# Patient Record
Sex: Female | Born: 1950
Health system: Southern US, Community
[De-identification: ages and names within clinical notes are randomized; demographics above are authoritative.]

## PROBLEM LIST (undated history)

## (undated) DIAGNOSIS — F419 Anxiety disorder, unspecified: Secondary | ICD-10-CM

## (undated) DIAGNOSIS — F329 Major depressive disorder, single episode, unspecified: Secondary | ICD-10-CM

## (undated) DIAGNOSIS — F32A Depression, unspecified: Secondary | ICD-10-CM

## (undated) DIAGNOSIS — M419 Scoliosis, unspecified: Secondary | ICD-10-CM

## (undated) DIAGNOSIS — M75100 Unspecified rotator cuff tear or rupture of unspecified shoulder, not specified as traumatic: Secondary | ICD-10-CM

## (undated) DIAGNOSIS — Z98891 History of uterine scar from previous surgery: Secondary | ICD-10-CM

## (undated) DIAGNOSIS — A6000 Herpesviral infection of urogenital system, unspecified: Secondary | ICD-10-CM

## (undated) DIAGNOSIS — I10 Essential (primary) hypertension: Secondary | ICD-10-CM

## (undated) DIAGNOSIS — T7840XA Allergy, unspecified, initial encounter: Secondary | ICD-10-CM

## (undated) HISTORY — DX: Essential (primary) hypertension: I10

## (undated) HISTORY — PX: BILATERAL CARPAL TUNNEL RELEASE: SHX6508

## (undated) HISTORY — DX: Anxiety disorder, unspecified: F41.9

## (undated) HISTORY — DX: Depression, unspecified: F32.A

## (undated) HISTORY — PX: OTHER SURGICAL HISTORY: SHX169

## (undated) HISTORY — DX: Herpesviral infection of urogenital system, unspecified: A60.00

## (undated) HISTORY — PX: BREAST EXCISIONAL BIOPSY: SUR124

## (undated) HISTORY — PX: TOTAL SHOULDER ARTHROPLASTY: SHX126

## (undated) HISTORY — DX: Scoliosis, unspecified: M41.9

## (undated) HISTORY — DX: Allergy, unspecified, initial encounter: T78.40XA

## (undated) HISTORY — DX: History of uterine scar from previous surgery: Z98.891

## (undated) HISTORY — PX: TRIGGER FINGER RELEASE: SHX641

## (undated) HISTORY — PX: FOOT SURGERY: SHX648

## (undated) HISTORY — DX: Unspecified rotator cuff tear or rupture of unspecified shoulder, not specified as traumatic: M75.100

## (undated) HISTORY — DX: Major depressive disorder, single episode, unspecified: F32.9

## (undated) HISTORY — PX: BLEPHAROPLASTY: SUR158

## (undated) HISTORY — PX: BREAST BIOPSY: SHX20

---

## 2016-02-15 ENCOUNTER — Telehealth: Payer: Self-pay | Admitting: Family Medicine

## 2016-02-15 NOTE — Telephone Encounter (Signed)
Pts daughter referred her to Dr. Laury AxonLowne as PCP. Daughter is Wilhemina Bonitoicole Gillott. Pt has Medicare and [employer] insurance. Please advise.

## 2016-02-15 NOTE — Telephone Encounter (Signed)
i will see her

## 2016-02-15 NOTE — Telephone Encounter (Signed)
Please advise if you would see this patient.Erin Acosta.    KP

## 2016-02-16 NOTE — Telephone Encounter (Signed)
Scheduled appt 1st available and left msg to notify pt of date/time

## 2016-03-02 ENCOUNTER — Telehealth: Payer: Self-pay | Admitting: *Deleted

## 2016-03-02 DIAGNOSIS — Z124 Encounter for screening for malignant neoplasm of cervix: Secondary | ICD-10-CM | POA: Diagnosis not present

## 2016-03-02 NOTE — Telephone Encounter (Signed)
Received patient Medical Records; forwarded to provider/SLSL 04/12

## 2016-03-03 DIAGNOSIS — M9901 Segmental and somatic dysfunction of cervical region: Secondary | ICD-10-CM | POA: Diagnosis not present

## 2016-03-03 DIAGNOSIS — M9902 Segmental and somatic dysfunction of thoracic region: Secondary | ICD-10-CM | POA: Diagnosis not present

## 2016-03-03 DIAGNOSIS — M542 Cervicalgia: Secondary | ICD-10-CM | POA: Diagnosis not present

## 2016-03-03 DIAGNOSIS — M546 Pain in thoracic spine: Secondary | ICD-10-CM | POA: Diagnosis not present

## 2016-03-07 DIAGNOSIS — M9902 Segmental and somatic dysfunction of thoracic region: Secondary | ICD-10-CM | POA: Diagnosis not present

## 2016-03-07 DIAGNOSIS — M542 Cervicalgia: Secondary | ICD-10-CM | POA: Diagnosis not present

## 2016-03-07 DIAGNOSIS — M9901 Segmental and somatic dysfunction of cervical region: Secondary | ICD-10-CM | POA: Diagnosis not present

## 2016-03-07 DIAGNOSIS — M546 Pain in thoracic spine: Secondary | ICD-10-CM | POA: Diagnosis not present

## 2016-03-08 DIAGNOSIS — M542 Cervicalgia: Secondary | ICD-10-CM | POA: Diagnosis not present

## 2016-03-08 DIAGNOSIS — M9902 Segmental and somatic dysfunction of thoracic region: Secondary | ICD-10-CM | POA: Diagnosis not present

## 2016-03-08 DIAGNOSIS — M546 Pain in thoracic spine: Secondary | ICD-10-CM | POA: Diagnosis not present

## 2016-03-08 DIAGNOSIS — M9901 Segmental and somatic dysfunction of cervical region: Secondary | ICD-10-CM | POA: Diagnosis not present

## 2016-03-10 DIAGNOSIS — R079 Chest pain, unspecified: Secondary | ICD-10-CM | POA: Diagnosis not present

## 2016-03-10 DIAGNOSIS — F439 Reaction to severe stress, unspecified: Secondary | ICD-10-CM | POA: Diagnosis not present

## 2016-03-10 DIAGNOSIS — E789 Disorder of lipoprotein metabolism, unspecified: Secondary | ICD-10-CM | POA: Diagnosis not present

## 2016-03-10 DIAGNOSIS — I771 Stricture of artery: Secondary | ICD-10-CM | POA: Insufficient documentation

## 2016-03-10 DIAGNOSIS — I10 Essential (primary) hypertension: Secondary | ICD-10-CM | POA: Diagnosis not present

## 2016-03-10 DIAGNOSIS — E785 Hyperlipidemia, unspecified: Secondary | ICD-10-CM | POA: Diagnosis not present

## 2016-03-15 ENCOUNTER — Telehealth: Payer: Self-pay | Admitting: Family Medicine

## 2016-03-15 ENCOUNTER — Ambulatory Visit: Payer: Self-pay | Admitting: Family Medicine

## 2016-03-15 DIAGNOSIS — M9901 Segmental and somatic dysfunction of cervical region: Secondary | ICD-10-CM | POA: Diagnosis not present

## 2016-03-15 DIAGNOSIS — M542 Cervicalgia: Secondary | ICD-10-CM | POA: Diagnosis not present

## 2016-03-15 DIAGNOSIS — M9902 Segmental and somatic dysfunction of thoracic region: Secondary | ICD-10-CM | POA: Diagnosis not present

## 2016-03-15 DIAGNOSIS — M546 Pain in thoracic spine: Secondary | ICD-10-CM | POA: Diagnosis not present

## 2016-03-16 NOTE — Telephone Encounter (Signed)
Pt came in late for appt 4/25 11:15pm,  Charge or no charge? Pt was rescheduled for 5/1

## 2016-03-16 NOTE — Telephone Encounter (Signed)
If first time ---no charge 

## 2016-03-17 DIAGNOSIS — M9902 Segmental and somatic dysfunction of thoracic region: Secondary | ICD-10-CM | POA: Diagnosis not present

## 2016-03-17 DIAGNOSIS — M546 Pain in thoracic spine: Secondary | ICD-10-CM | POA: Diagnosis not present

## 2016-03-17 DIAGNOSIS — M542 Cervicalgia: Secondary | ICD-10-CM | POA: Diagnosis not present

## 2016-03-17 DIAGNOSIS — M9901 Segmental and somatic dysfunction of cervical region: Secondary | ICD-10-CM | POA: Diagnosis not present

## 2016-03-17 NOTE — Telephone Encounter (Signed)
1st no show - no charge

## 2016-03-21 ENCOUNTER — Encounter: Payer: Self-pay | Admitting: Family Medicine

## 2016-03-21 ENCOUNTER — Ambulatory Visit (INDEPENDENT_AMBULATORY_CARE_PROVIDER_SITE_OTHER): Payer: Medicare Other | Admitting: Family Medicine

## 2016-03-21 ENCOUNTER — Other Ambulatory Visit: Payer: Self-pay | Admitting: Family Medicine

## 2016-03-21 VITALS — BP 112/76 | HR 76 | Temp 97.6°F | Ht 60.0 in | Wt 147.4 lb

## 2016-03-21 DIAGNOSIS — F411 Generalized anxiety disorder: Secondary | ICD-10-CM | POA: Diagnosis not present

## 2016-03-21 DIAGNOSIS — M9902 Segmental and somatic dysfunction of thoracic region: Secondary | ICD-10-CM | POA: Diagnosis not present

## 2016-03-21 DIAGNOSIS — M546 Pain in thoracic spine: Secondary | ICD-10-CM | POA: Diagnosis not present

## 2016-03-21 DIAGNOSIS — M75101 Unspecified rotator cuff tear or rupture of right shoulder, not specified as traumatic: Secondary | ICD-10-CM

## 2016-03-21 DIAGNOSIS — M9901 Segmental and somatic dysfunction of cervical region: Secondary | ICD-10-CM | POA: Diagnosis not present

## 2016-03-21 DIAGNOSIS — M542 Cervicalgia: Secondary | ICD-10-CM | POA: Diagnosis not present

## 2016-03-21 MED ORDER — CLONAZEPAM 1 MG PO TABS: 1.0000 mg | ORAL_TABLET | Freq: Every day | ORAL | Status: DC | PRN

## 2016-03-21 MED ORDER — HYDROCODONE-ACETAMINOPHEN 5-325 MG PO TABS: 1.0000 | ORAL_TABLET | Freq: Four times a day (QID) | ORAL | Status: DC | PRN

## 2016-03-21 NOTE — Patient Instructions (Signed)
Generalized Anxiety Disorder Generalized anxiety disorder (GAD) is a mental disorder. It interferes with life functions, including relationships, work, and school. GAD is different from normal anxiety, which everyone experiences at some point in their lives in response to specific life events and activities. Normal anxiety actually helps us prepare for and get through these life events and activities. Normal anxiety goes away after the event or activity is over.  GAD causes anxiety that is not necessarily related to specific events or activities. It also causes excess anxiety in proportion to specific events or activities. The anxiety associated with GAD is also difficult to control. GAD can vary from mild to severe. People with severe GAD can have intense waves of anxiety with physical symptoms (panic attacks).  SYMPTOMS The anxiety and worry associated with GAD are difficult to control. This anxiety and worry are related to many life events and activities and also occur more days than not for 6 months or longer. People with GAD also have three or more of the following symptoms (one or more in children):  Restlessness.   Fatigue.  Difficulty concentrating.   Irritability.  Muscle tension.  Difficulty sleeping or unsatisfying sleep. DIAGNOSIS GAD is diagnosed through an assessment by your health care provider. Your health care provider will ask you questions aboutyour mood,physical symptoms, and events in your life. Your health care provider may ask you about your medical history and use of alcohol or drugs, including prescription medicines. Your health care provider may also do a physical exam and blood tests. Certain medical conditions and the use of certain substances can cause symptoms similar to those associated with GAD. Your health care provider may refer you to a mental health specialist for further evaluation. TREATMENT The following therapies are usually used to treat GAD:    Medication. Antidepressant medication usually is prescribed for long-term daily control. Antianxiety medicines may be added in severe cases, especially when panic attacks occur.   Talk therapy (psychotherapy). Certain types of talk therapy can be helpful in treating GAD by providing support, education, and guidance. A form of talk therapy called cognitive behavioral therapy can teach you healthy ways to think about and react to daily life events and activities.  Stress managementtechniques. These include yoga, meditation, and exercise and can be very helpful when they are practiced regularly. A mental health specialist can help determine which treatment is best for you. Some people see improvement with one therapy. However, other people require a combination of therapies.   This information is not intended to replace advice given to you by your health care provider. Make sure you discuss any questions you have with your health care provider.   Document Released: 03/04/2013 Document Revised: 11/28/2014 Document Reviewed: 03/04/2013 Elsevier Interactive Patient Education 2016 [employer]  

## 2016-03-21 NOTE — Progress Notes (Signed)
Patient ID: Erin Acosta, female    DOB: Apr 19, 1951  Age: 65 y.o. MRN: [id number]    Subjective:  Subjective HPI Erin Acosta presents for for refill on klonopin for anxiety and wants to discuss R shoulder-- she has a hx of a rotator cuff tear.    Review of Systems  Constitutional: Negative for diaphoresis, appetite change, fatigue and unexpected weight change.  Eyes: Negative for pain, redness and visual disturbance.  Respiratory: Negative for cough, chest tightness, shortness of breath and wheezing.   Cardiovascular: Negative for chest pain, palpitations and leg swelling.  Endocrine: Negative for cold intolerance, heat intolerance, polydipsia, polyphagia and polyuria.  Genitourinary: Negative for dysuria, frequency and difficulty urinating.  Musculoskeletal: Positive for arthralgias. Negative for joint swelling.  Neurological: Negative for dizziness, light-headedness, numbness and headaches.  Psychiatric/Behavioral: Positive for decreased concentration. The patient is nervous/anxious.     History Past Medical History  Diagnosis Date  . Hypertension   . Anxiety   . Depression   . Allergy   . Genital herpes   . Scoliosis   . H/O cesarean section   . Rotator cuff tear     She has past surgical history that includes Bilateral carpal tunnel release; Trigger finger release; Foot surgery (Right); Cesarean section; tummy tuck; mini face lift; and Blepharoplasty.   Her family history is not on file.She reports that she has never smoked. She has never used smokeless tobacco. She reports that she drinks about 0.6 oz of alcohol per week. She reports that she does not use illicit drugs.  No current outpatient prescriptions on file prior to visit.   No current facility-administered medications on file prior to visit.     Objective:  Objective Physical Exam  Constitutional: She is oriented to person, place, and time. She appears well-developed and well-nourished.  HENT:  Head:  Normocephalic and atraumatic.  Eyes: Conjunctivae and EOM are normal.  Neck: Normal range of motion. Neck supple. No JVD present. Carotid bruit is not present. No thyromegaly present.  Cardiovascular: Normal rate, regular rhythm and normal heart sounds.   No murmur heard. Pulmonary/Chest: Effort normal and breath sounds normal. No respiratory distress. She has no wheezes. She has no rales. She exhibits no tenderness.  Musculoskeletal: She exhibits tenderness. She exhibits no edema.       Right shoulder: She exhibits tenderness.  Neurological: She is alert and oriented to person, place, and time.  Psychiatric: She has a normal mood and affect.  Nursing note and vitals reviewed.  BP 112/76 mmHg  Pulse 76  Temp(Src) 97.6 F (36.4 C) (Oral)  Ht 5' (1.524 m)  Wt 147 lb 6.4 oz (66.86 kg)  BMI 28.79 kg/m2  SpO2 99% Wt Readings from Last 3 Encounters:  03/21/16 147 lb 6.4 oz (66.86 kg)     No results found for: WBC, HGB, HCT, PLT, GLUCOSE, CHOL, TRIG, HDL, LDLDIRECT, LDLCALC, ALT, AST, NA, K, CL, CREATININE, BUN, CO2, TSH, PSA, INR, GLUF, HGBA1C, MICROALBUR  Patient was never admitted.   Assessment & Plan:  Plan I have changed [name]'s clonazePAM. I am also having her start on HYDROcodone-acetaminophen. Additionally, I am having her maintain her buPROPion, montelukast, lisinopril, aspirin EC, multivitamin, and valACYclovir.  Meds ordered this encounter  Medications  . buPROPion (WELLBUTRIN XL) 300 MG 24 hr tablet    Sig: Take 300 mg by mouth daily.  . montelukast (SINGULAIR) 10 MG tablet    Sig: Take 10 mg by mouth daily.  Marland Kitchen lisinopril (PRINIVIL,ZESTRIL) 5 MG  tablet    Sig: Take 5 mg by mouth daily.  Marland Kitchen. aspirin EC 81 MG tablet    Sig: Take 81 mg by mouth daily.  Marland Kitchen. DISCONTD: clonazePAM (KLONOPIN) 1 MG tablet    Sig: Take 1 mg by mouth daily as needed.  . Multiple Vitamin (MULTIVITAMIN) capsule    Sig: Take 1 capsule by mouth daily.  . valACYclovir (VALTREX) 500 MG tablet     Sig: Take 500 mg by mouth daily.  . clonazePAM (KLONOPIN) 1 MG tablet    Sig: Take 1 tablet (1 mg total) by mouth daily as needed.    Dispense:  90 tablet    Refill:  1  . HYDROcodone-acetaminophen (NORCO/VICODIN) 5-325 MG tablet    Sig: Take 1 tablet by mouth every 6 (six) hours as needed for moderate pain.    Dispense:  30 tablet    Refill:  0    Problem List Items Addressed This Visit      Unprioritized   Generalized anxiety disorder - Primary    Refill klonopin      Relevant Medications   clonazePAM (KLONOPIN) 1 MG tablet    Other Visit Diagnoses    RCT (rotator cuff tear), right        Relevant Medications    HYDROcodone-acetaminophen (NORCO/VICODIN) 5-325 MG tablet       Follow-up: Return if symptoms worsen or fail to improve, for as scheduled.  Donato SchultzYvonne R Lowne Chase, DO

## 2016-03-21 NOTE — Progress Notes (Signed)
Pre visit review using our clinic review tool, if applicable. No additional management support is needed unless otherwise documented below in the visit note. 

## 2016-03-22 DIAGNOSIS — M9902 Segmental and somatic dysfunction of thoracic region: Secondary | ICD-10-CM | POA: Diagnosis not present

## 2016-03-22 DIAGNOSIS — M542 Cervicalgia: Secondary | ICD-10-CM | POA: Diagnosis not present

## 2016-03-22 DIAGNOSIS — M546 Pain in thoracic spine: Secondary | ICD-10-CM | POA: Diagnosis not present

## 2016-03-22 DIAGNOSIS — M9901 Segmental and somatic dysfunction of cervical region: Secondary | ICD-10-CM | POA: Diagnosis not present

## 2016-03-24 DIAGNOSIS — M542 Cervicalgia: Secondary | ICD-10-CM | POA: Diagnosis not present

## 2016-03-24 DIAGNOSIS — M9902 Segmental and somatic dysfunction of thoracic region: Secondary | ICD-10-CM | POA: Diagnosis not present

## 2016-03-24 DIAGNOSIS — M9901 Segmental and somatic dysfunction of cervical region: Secondary | ICD-10-CM | POA: Diagnosis not present

## 2016-03-24 DIAGNOSIS — M546 Pain in thoracic spine: Secondary | ICD-10-CM | POA: Diagnosis not present

## 2016-03-27 DIAGNOSIS — F411 Generalized anxiety disorder: Secondary | ICD-10-CM | POA: Insufficient documentation

## 2016-03-27 DIAGNOSIS — M75101 Unspecified rotator cuff tear or rupture of right shoulder, not specified as traumatic: Secondary | ICD-10-CM | POA: Insufficient documentation

## 2016-03-27 NOTE — Assessment & Plan Note (Addendum)
Refill klonopin Symptoms stable

## 2016-03-27 NOTE — Assessment & Plan Note (Signed)
Pt has been under care or ortho Consider physical therapy

## 2016-03-28 DIAGNOSIS — M9901 Segmental and somatic dysfunction of cervical region: Secondary | ICD-10-CM | POA: Diagnosis not present

## 2016-03-28 DIAGNOSIS — M542 Cervicalgia: Secondary | ICD-10-CM | POA: Diagnosis not present

## 2016-03-28 DIAGNOSIS — M9902 Segmental and somatic dysfunction of thoracic region: Secondary | ICD-10-CM | POA: Diagnosis not present

## 2016-03-28 DIAGNOSIS — M546 Pain in thoracic spine: Secondary | ICD-10-CM | POA: Diagnosis not present

## 2016-03-29 ENCOUNTER — Other Ambulatory Visit: Payer: Self-pay | Admitting: Family Medicine

## 2016-03-29 DIAGNOSIS — M546 Pain in thoracic spine: Secondary | ICD-10-CM | POA: Diagnosis not present

## 2016-03-29 DIAGNOSIS — M9901 Segmental and somatic dysfunction of cervical region: Secondary | ICD-10-CM | POA: Diagnosis not present

## 2016-03-29 DIAGNOSIS — M9902 Segmental and somatic dysfunction of thoracic region: Secondary | ICD-10-CM | POA: Diagnosis not present

## 2016-03-29 DIAGNOSIS — M542 Cervicalgia: Secondary | ICD-10-CM | POA: Diagnosis not present

## 2016-03-31 DIAGNOSIS — M542 Cervicalgia: Secondary | ICD-10-CM | POA: Diagnosis not present

## 2016-03-31 DIAGNOSIS — L700 Acne vulgaris: Secondary | ICD-10-CM | POA: Diagnosis not present

## 2016-03-31 DIAGNOSIS — M546 Pain in thoracic spine: Secondary | ICD-10-CM | POA: Diagnosis not present

## 2016-03-31 DIAGNOSIS — Z79899 Other long term (current) drug therapy: Secondary | ICD-10-CM | POA: Diagnosis not present

## 2016-03-31 DIAGNOSIS — M9902 Segmental and somatic dysfunction of thoracic region: Secondary | ICD-10-CM | POA: Diagnosis not present

## 2016-03-31 DIAGNOSIS — M9901 Segmental and somatic dysfunction of cervical region: Secondary | ICD-10-CM | POA: Diagnosis not present

## 2016-04-01 DIAGNOSIS — E789 Disorder of lipoprotein metabolism, unspecified: Secondary | ICD-10-CM | POA: Diagnosis not present

## 2016-04-05 DIAGNOSIS — M542 Cervicalgia: Secondary | ICD-10-CM | POA: Diagnosis not present

## 2016-04-05 DIAGNOSIS — M9902 Segmental and somatic dysfunction of thoracic region: Secondary | ICD-10-CM | POA: Diagnosis not present

## 2016-04-05 DIAGNOSIS — M9901 Segmental and somatic dysfunction of cervical region: Secondary | ICD-10-CM | POA: Diagnosis not present

## 2016-04-05 DIAGNOSIS — M546 Pain in thoracic spine: Secondary | ICD-10-CM | POA: Diagnosis not present

## 2016-04-07 DIAGNOSIS — M9901 Segmental and somatic dysfunction of cervical region: Secondary | ICD-10-CM | POA: Diagnosis not present

## 2016-04-07 DIAGNOSIS — M546 Pain in thoracic spine: Secondary | ICD-10-CM | POA: Diagnosis not present

## 2016-04-07 DIAGNOSIS — M9902 Segmental and somatic dysfunction of thoracic region: Secondary | ICD-10-CM | POA: Diagnosis not present

## 2016-04-07 DIAGNOSIS — M542 Cervicalgia: Secondary | ICD-10-CM | POA: Diagnosis not present

## 2016-04-11 DIAGNOSIS — M546 Pain in thoracic spine: Secondary | ICD-10-CM | POA: Diagnosis not present

## 2016-04-11 DIAGNOSIS — M9901 Segmental and somatic dysfunction of cervical region: Secondary | ICD-10-CM | POA: Diagnosis not present

## 2016-04-11 DIAGNOSIS — M9902 Segmental and somatic dysfunction of thoracic region: Secondary | ICD-10-CM | POA: Diagnosis not present

## 2016-04-11 DIAGNOSIS — M542 Cervicalgia: Secondary | ICD-10-CM | POA: Diagnosis not present

## 2016-04-12 DIAGNOSIS — M9902 Segmental and somatic dysfunction of thoracic region: Secondary | ICD-10-CM | POA: Diagnosis not present

## 2016-04-12 DIAGNOSIS — M546 Pain in thoracic spine: Secondary | ICD-10-CM | POA: Diagnosis not present

## 2016-04-12 DIAGNOSIS — M9901 Segmental and somatic dysfunction of cervical region: Secondary | ICD-10-CM | POA: Diagnosis not present

## 2016-04-12 DIAGNOSIS — M542 Cervicalgia: Secondary | ICD-10-CM | POA: Diagnosis not present

## 2016-04-14 DIAGNOSIS — M546 Pain in thoracic spine: Secondary | ICD-10-CM | POA: Diagnosis not present

## 2016-04-14 DIAGNOSIS — M9902 Segmental and somatic dysfunction of thoracic region: Secondary | ICD-10-CM | POA: Diagnosis not present

## 2016-04-14 DIAGNOSIS — M9901 Segmental and somatic dysfunction of cervical region: Secondary | ICD-10-CM | POA: Diagnosis not present

## 2016-04-14 DIAGNOSIS — M542 Cervicalgia: Secondary | ICD-10-CM | POA: Diagnosis not present

## 2016-04-19 DIAGNOSIS — M542 Cervicalgia: Secondary | ICD-10-CM | POA: Diagnosis not present

## 2016-04-19 DIAGNOSIS — M9901 Segmental and somatic dysfunction of cervical region: Secondary | ICD-10-CM | POA: Diagnosis not present

## 2016-04-19 DIAGNOSIS — M546 Pain in thoracic spine: Secondary | ICD-10-CM | POA: Diagnosis not present

## 2016-04-19 DIAGNOSIS — M9902 Segmental and somatic dysfunction of thoracic region: Secondary | ICD-10-CM | POA: Diagnosis not present

## 2016-04-21 DIAGNOSIS — M546 Pain in thoracic spine: Secondary | ICD-10-CM | POA: Diagnosis not present

## 2016-04-21 DIAGNOSIS — M9901 Segmental and somatic dysfunction of cervical region: Secondary | ICD-10-CM | POA: Diagnosis not present

## 2016-04-21 DIAGNOSIS — M9902 Segmental and somatic dysfunction of thoracic region: Secondary | ICD-10-CM | POA: Diagnosis not present

## 2016-04-21 DIAGNOSIS — M542 Cervicalgia: Secondary | ICD-10-CM | POA: Diagnosis not present

## 2016-04-25 DIAGNOSIS — M9901 Segmental and somatic dysfunction of cervical region: Secondary | ICD-10-CM | POA: Diagnosis not present

## 2016-04-25 DIAGNOSIS — M9902 Segmental and somatic dysfunction of thoracic region: Secondary | ICD-10-CM | POA: Diagnosis not present

## 2016-04-25 DIAGNOSIS — M542 Cervicalgia: Secondary | ICD-10-CM | POA: Diagnosis not present

## 2016-04-25 DIAGNOSIS — M546 Pain in thoracic spine: Secondary | ICD-10-CM | POA: Diagnosis not present

## 2016-05-03 DIAGNOSIS — M9902 Segmental and somatic dysfunction of thoracic region: Secondary | ICD-10-CM | POA: Diagnosis not present

## 2016-05-03 DIAGNOSIS — M542 Cervicalgia: Secondary | ICD-10-CM | POA: Diagnosis not present

## 2016-05-03 DIAGNOSIS — M546 Pain in thoracic spine: Secondary | ICD-10-CM | POA: Diagnosis not present

## 2016-05-03 DIAGNOSIS — M9901 Segmental and somatic dysfunction of cervical region: Secondary | ICD-10-CM | POA: Diagnosis not present

## 2016-05-05 DIAGNOSIS — M542 Cervicalgia: Secondary | ICD-10-CM | POA: Diagnosis not present

## 2016-05-05 DIAGNOSIS — M546 Pain in thoracic spine: Secondary | ICD-10-CM | POA: Diagnosis not present

## 2016-05-05 DIAGNOSIS — M9901 Segmental and somatic dysfunction of cervical region: Secondary | ICD-10-CM | POA: Diagnosis not present

## 2016-05-05 DIAGNOSIS — M9902 Segmental and somatic dysfunction of thoracic region: Secondary | ICD-10-CM | POA: Diagnosis not present

## 2016-05-10 DIAGNOSIS — M9901 Segmental and somatic dysfunction of cervical region: Secondary | ICD-10-CM | POA: Diagnosis not present

## 2016-05-10 DIAGNOSIS — M546 Pain in thoracic spine: Secondary | ICD-10-CM | POA: Diagnosis not present

## 2016-05-10 DIAGNOSIS — M9902 Segmental and somatic dysfunction of thoracic region: Secondary | ICD-10-CM | POA: Diagnosis not present

## 2016-05-10 DIAGNOSIS — M542 Cervicalgia: Secondary | ICD-10-CM | POA: Diagnosis not present

## 2016-05-12 DIAGNOSIS — M546 Pain in thoracic spine: Secondary | ICD-10-CM | POA: Diagnosis not present

## 2016-05-12 DIAGNOSIS — M9901 Segmental and somatic dysfunction of cervical region: Secondary | ICD-10-CM | POA: Diagnosis not present

## 2016-05-12 DIAGNOSIS — M9902 Segmental and somatic dysfunction of thoracic region: Secondary | ICD-10-CM | POA: Diagnosis not present

## 2016-05-12 DIAGNOSIS — M542 Cervicalgia: Secondary | ICD-10-CM | POA: Diagnosis not present

## 2016-05-17 DIAGNOSIS — M546 Pain in thoracic spine: Secondary | ICD-10-CM | POA: Diagnosis not present

## 2016-05-17 DIAGNOSIS — M9902 Segmental and somatic dysfunction of thoracic region: Secondary | ICD-10-CM | POA: Diagnosis not present

## 2016-05-17 DIAGNOSIS — M542 Cervicalgia: Secondary | ICD-10-CM | POA: Diagnosis not present

## 2016-05-17 DIAGNOSIS — M9901 Segmental and somatic dysfunction of cervical region: Secondary | ICD-10-CM | POA: Diagnosis not present

## 2016-05-19 DIAGNOSIS — M542 Cervicalgia: Secondary | ICD-10-CM | POA: Diagnosis not present

## 2016-05-19 DIAGNOSIS — M9901 Segmental and somatic dysfunction of cervical region: Secondary | ICD-10-CM | POA: Diagnosis not present

## 2016-05-19 DIAGNOSIS — M9902 Segmental and somatic dysfunction of thoracic region: Secondary | ICD-10-CM | POA: Diagnosis not present

## 2016-05-19 DIAGNOSIS — M546 Pain in thoracic spine: Secondary | ICD-10-CM | POA: Diagnosis not present

## 2016-05-26 ENCOUNTER — Other Ambulatory Visit (HOSPITAL_BASED_OUTPATIENT_CLINIC_OR_DEPARTMENT_OTHER): Payer: Self-pay | Admitting: Obstetrics and Gynecology

## 2016-05-26 ENCOUNTER — Ambulatory Visit (HOSPITAL_BASED_OUTPATIENT_CLINIC_OR_DEPARTMENT_OTHER)
Admission: RE | Admit: 2016-05-26 | Discharge: 2016-05-26 | Disposition: A | Discharge status: Home / Self Care | Payer: Medicare Other | Source: Ambulatory Visit | Attending: Obstetrics and Gynecology | Admitting: Obstetrics and Gynecology

## 2016-05-26 ENCOUNTER — Other Ambulatory Visit: Payer: Self-pay | Admitting: Family Medicine

## 2016-05-26 DIAGNOSIS — M542 Cervicalgia: Secondary | ICD-10-CM | POA: Diagnosis not present

## 2016-05-26 DIAGNOSIS — Z1231 Encounter for screening mammogram for malignant neoplasm of breast: Secondary | ICD-10-CM | POA: Diagnosis not present

## 2016-05-26 DIAGNOSIS — M9902 Segmental and somatic dysfunction of thoracic region: Secondary | ICD-10-CM | POA: Diagnosis not present

## 2016-05-26 DIAGNOSIS — M9901 Segmental and somatic dysfunction of cervical region: Secondary | ICD-10-CM | POA: Diagnosis not present

## 2016-05-26 DIAGNOSIS — M546 Pain in thoracic spine: Secondary | ICD-10-CM | POA: Diagnosis not present

## 2016-05-27 DIAGNOSIS — L905 Scar conditions and fibrosis of skin: Secondary | ICD-10-CM | POA: Diagnosis not present

## 2016-05-27 DIAGNOSIS — L700 Acne vulgaris: Secondary | ICD-10-CM | POA: Diagnosis not present

## 2016-06-02 ENCOUNTER — Encounter: Payer: Self-pay | Admitting: Family Medicine

## 2016-06-02 ENCOUNTER — Ambulatory Visit (INDEPENDENT_AMBULATORY_CARE_PROVIDER_SITE_OTHER): Payer: Medicare Other | Admitting: Family Medicine

## 2016-06-02 VITALS — BP 138/85 | HR 80 | Temp 98.1°F | Ht 60.0 in | Wt 146.2 lb

## 2016-06-02 DIAGNOSIS — Z23 Encounter for immunization: Secondary | ICD-10-CM

## 2016-06-02 DIAGNOSIS — I10 Essential (primary) hypertension: Secondary | ICD-10-CM | POA: Diagnosis not present

## 2016-06-02 DIAGNOSIS — E781 Pure hyperglyceridemia: Secondary | ICD-10-CM

## 2016-06-02 DIAGNOSIS — Z0000 Encounter for general adult medical examination without abnormal findings: Secondary | ICD-10-CM

## 2016-06-02 DIAGNOSIS — M9902 Segmental and somatic dysfunction of thoracic region: Secondary | ICD-10-CM | POA: Diagnosis not present

## 2016-06-02 DIAGNOSIS — M546 Pain in thoracic spine: Secondary | ICD-10-CM | POA: Diagnosis not present

## 2016-06-02 DIAGNOSIS — M542 Cervicalgia: Secondary | ICD-10-CM | POA: Diagnosis not present

## 2016-06-02 DIAGNOSIS — M9901 Segmental and somatic dysfunction of cervical region: Secondary | ICD-10-CM | POA: Diagnosis not present

## 2016-06-02 DIAGNOSIS — F411 Generalized anxiety disorder: Secondary | ICD-10-CM | POA: Diagnosis not present

## 2016-06-02 DIAGNOSIS — J302 Other seasonal allergic rhinitis: Secondary | ICD-10-CM

## 2016-06-02 MED ORDER — CLONAZEPAM 1 MG PO TABS: 1.0000 mg | ORAL_TABLET | Freq: Every day | ORAL | Status: DC | PRN

## 2016-06-02 MED ORDER — MONTELUKAST SODIUM 10 MG PO TABS: 10.0000 mg | ORAL_TABLET | Freq: Every day | ORAL | Status: DC

## 2016-06-02 MED ORDER — LISINOPRIL 5 MG PO TABS: 5.0000 mg | ORAL_TABLET | Freq: Every day | ORAL | Status: DC

## 2016-06-02 NOTE — Progress Notes (Signed)
Pre visit review using our clinic review tool, if applicable. No additional management support is needed unless otherwise documented below in the visit note. 

## 2016-06-02 NOTE — Progress Notes (Signed)
Subjective:    Erin Acosta is a 65 y.o. female who presents for a Welcome to Medicare exam.   Review of Systems  Review of Systems  Constitutional: Negative for activity change, appetite change and fatigue.  HENT: Negative for hearing loss, congestion, tinnitus and ear discharge.   Eyes: Negative for visual disturbance (see optho q1y -- vision corrected to 20/20 with glasses).  Respiratory: Negative for cough, chest tightness and shortness of breath.   Cardiovascular: Negative for chest pain, palpitations and leg swelling.  Gastrointestinal: Negative for abdominal pain, diarrhea, constipation and abdominal distention.  Genitourinary: Negative for urgency, frequency, decreased urine volume and difficulty urinating.  Musculoskeletal: Negative for back pain, arthralgias and gait problem.  Skin: Negative for color change, pallor and rash.  Neurological: Negative for dizziness, light-headedness, numbness and headaches.  Hematological: Negative for adenopathy. Does not bruise/bleed easily.  Psychiatric/Behavioral: Negative for suicidal ideas, confusion, sleep disturbance, self-injury, dysphoric mood, decreased concentration and agitation.  Pt is able to read and write and can do all ADLs No risk for falling No abuse/ violence in home            Objective:    Today's Vitals   06/02/16 1537  BP: 138/85  Pulse: 80  Temp: 98.1 F (36.7 C)  TempSrc: Oral  Height: 5' (1.524 m)  Weight: 146 lb 3.2 oz (66.316 kg)  SpO2: 99%  Body mass index is 28.55 kg/(m^2).  Medications Outpatient Encounter Prescriptions as of 06/02/2016  Medication Sig  . aspirin EC 81 MG tablet Take 81 mg by mouth daily.  Marland Kitchen buPROPion (WELLBUTRIN XL) 300 MG 24 hr tablet Take 300 mg by mouth daily.  . clonazePAM (KLONOPIN) 1 MG tablet Take 1 tablet (1 mg total) by mouth daily as needed.  Marland Kitchen HYDROcodone-acetaminophen (NORCO/VICODIN) 5-325 MG tablet Take 1 tablet by mouth every 6 (six) hours as needed for  moderate pain.  . ISOtretinoin (ACCUTANE) 40 MG capsule Take 40 mg by mouth 2 (two) times daily.  Marland Kitchen lisinopril (PRINIVIL,ZESTRIL) 5 MG tablet Take 5 mg by mouth daily.  . montelukast (SINGULAIR) 10 MG tablet Take 10 mg by mouth daily.  . Multiple Vitamin (MULTIVITAMIN) capsule Take 1 capsule by mouth daily.  . valACYclovir (VALTREX) 500 MG tablet Take 500 mg by mouth daily.   No facility-administered encounter medications on file as of 06/02/2016.     History: Past Medical History  Diagnosis Date  . Hypertension   . Anxiety   . Depression   . Allergy   . Genital herpes   . Scoliosis   . H/O cesarean section   . Rotator cuff tear    Past Surgical History  Procedure Laterality Date  . Bilateral carpal tunnel release    . Trigger finger release    . Foot surgery Right   . Cesarean section    . Tummy tuck    . Mini face lift    . Blepharoplasty      Family History  Problem Relation Age of Onset  . Arthritis    . Hyperlipidemia    . Heart disease    . Stroke    . Hypertension     Social History   Occupational History  . Not on file.   Social History Main Topics  . Smoking status: Never Smoker   . Smokeless tobacco: Never Used  . Alcohol Use: 0.6 oz/week    1 Standard drinks or equivalent per week     Comment: Wine in  the evening  . Drug Use: No  . Sexual Activity: Not on file    Tobacco Counseling Counseling given: Not Answered   Immunizations and Health Maintenance Immunization History  Administered Date(s) Administered  . Zoster 11/21/2013   Health Maintenance Due  Topic Date Due  . Hepatitis C Screening  1951-10-27  . HIV Screening  02/12/1966  . TETANUS/TDAP  02/12/1970  . PAP SMEAR  02/13/1972  . COLONOSCOPY  02/12/2001  . DEXA SCAN  02/13/2016  . PNA vac Low Risk Adult (1 of 2 - PCV13) 02/13/2016    Activities of Daily Living In your present state of health, do you have any difficulty performing the following activities: 06/02/2016 03/21/2016    Hearing? N N  Vision? N N  Difficulty concentrating or making decisions? Y N  Walking or climbing stairs? N N  Dressing or bathing? N N  Doing errands, shopping? N N    Physical Exam   BP 138/85 mmHg  Pulse 80  Temp(Src) 98.1 F (36.7 C) (Oral)  Ht 5' (1.524 m)  Wt 146 lb 3.2 oz (66.316 kg)  BMI 28.55 kg/m2  SpO2 99% General appearance: alert, cooperative, appears stated age and no distress Head: Normocephalic, without obvious abnormality, atraumatic Eyes: conjunctivae/corneas clear. PERRL, EOM's intact. Fundi benign. Ears: normal TM's and external ear canals both ears Nose: Nares normal. Septum midline. Mucosa normal. No drainage or sinus tenderness. Throat: lips, mucosa, and tongue normal; teeth and gums normal Neck: no adenopathy, no carotid bruit, no JVD, supple, symmetrical, trachea midline and thyroid not enlarged, symmetric, no tenderness/mass/nodules Back: symmetric, no curvature. ROM normal. No CVA tenderness. Lungs: clear to auscultation bilaterally Breasts: gyn Heart: regular rate and rhythm, S1, S2 normal, no murmur, click, rub or gallop Abdomen: soft, non-tender; bowel sounds normal; no masses,  no organomegaly Pelvic: deferred--gyn Extremities: extremities normal, atraumatic, no cyanosis or edema Pulses: 2+ and symmetric Skin: Skin color, texture, turgor normal. No rashes or lesions Lymph nodes: Cervical, supraclavicular, and axillary nodes normal. Neurologic: Alert and oriented X 3, normal strength and tone. Normal symmetric reflexes. Normal coordination and gait Advanced Directives: Does patient have an advance directive?: Yes Type of Advance Directive: Healthcare Power of Attorney, Living will Does patient want to make changes to advanced directive?: No - Patient declined Copy of advanced directive(s) in chart?: No - copy requested    Assessment:    This is a routine wellness examination for this patient .   Vision/Hearing screen Hearing Screening  Comments: Whisper normal Vision Screening Comments: opth--annual  Dietary issues and exercise activities discussed:  Current Exercise Habits: Home exercise routine, Type of exercise: strength training/weights, Time (Minutes): 15, Frequency (Times/Week): 7, Weekly Exercise (Minutes/Week): 105, Intensity: Mild, Exercise limited by: None identified  Goals    None     Depression Screen PHQ 2/9 Scores 06/02/2016 03/21/2016  PHQ - 2 Score 0 0     Fall Risk Fall Risk  06/02/2016  Falls in the past year? No    MMSE: 0/0 Patient Care Team: Donato SchultzYvonne R Lowne Chase, DO as PCP - General (Family Medicine)     Plan:     During the course of the visit the patient was educated and counseled about the following appropriate screening and preventive services:   Vaccines to include Pneumoccal, Influenza, Hepatitis B, Td, Zostavax, HCV  Electrocardiogram  Cardiovascular Disease  Colorectal cancer screening  Bone density screening  Diabetes screening  Glaucoma screening  Mammography/PAP  Nutrition counseling  Her current medications and  allergies were reviewed and needed refills of her chronic medications were ordered. The plan for yearly health maintenance was discussed and all orders and referrals were made as appropriate.  Patient Instructions (the written plan) was given to the patient.  1. Welcome to Medicare preventive visit---see above Normal ekg - EKG 12-Lead  2. Hypertriglyceridemia Check labs - POCT urinalysis dipstick - Lipid panel - CBC with Differential/Platelet - Comprehensive metabolic panel  3. Essential hypertension Stable  con't meds - POCT urinalysis dipstick - Lipid panel - CBC with Differential/Platelet - Comprehensive metabolic panel  [name], DO 06/02/2016

## 2016-06-02 NOTE — Patient Instructions (Addendum)
Preventive Care for Adults, Female A healthy lifestyle and preventive care can promote health and wellness. Preventive health guidelines for women include the following key practices.  A routine yearly physical is a good way to check with your health care provider about your health and preventive screening. It is a chance to share any concerns and updates on your health and to receive a thorough exam.  Visit your dentist for a routine exam and preventive care every 6 months. Brush your teeth twice a day and floss once a day. Good oral hygiene prevents tooth decay and gum disease.  The frequency of eye exams is based on your age, health, family medical history, use of contact lenses, and other factors. Follow your health care provider's recommendations for frequency of eye exams.  Eat a healthy diet. Foods like vegetables, fruits, whole grains, low-fat dairy products, and lean protein foods contain the nutrients you need without too many calories. Decrease your intake of foods high in solid fats, added sugars, and salt. Eat the right amount of calories for you.Get information about a proper diet from your health care provider, if necessary.  Regular physical exercise is one of the most important things you can do for your health. Most adults should get at least 150 minutes of moderate-intensity exercise (any activity that increases your heart rate and causes you to sweat) each week. In addition, most adults need muscle-strengthening exercises on 2 or more days a week.  Maintain a healthy weight. The body mass index (BMI) is a screening tool to identify possible weight problems. It provides an estimate of body fat based on height and weight. Your health care provider can find your BMI and can help you achieve or maintain a healthy weight.For adults 20 years and older:  A BMI below 18.5 is considered underweight.  A BMI of 18.5 to 24.9 is normal.  A BMI of 25 to 29.9 is considered overweight.  A  BMI of 30 and above is considered obese.  Maintain normal blood lipids and cholesterol levels by exercising and minimizing your intake of saturated fat. Eat a balanced diet with plenty of fruit and vegetables. Blood tests for lipids and cholesterol should begin at age 45 and be repeated every 5 years. If your lipid or cholesterol levels are high, you are over 50, or you are at high risk for heart disease, you may need your cholesterol levels checked more frequently.Ongoing high lipid and cholesterol levels should be treated with medicines if diet and exercise are not working.  If you smoke, find out from your health care provider how to quit. If you do not use tobacco, do not start.  Lung cancer screening is recommended for adults aged 45-80 years who are at high risk for developing lung cancer because of a history of smoking. A yearly low-dose CT scan of the lungs is recommended for people who have at least a 30-pack-year history of smoking and are a current smoker or have quit within the past 15 years. A pack year of smoking is smoking an average of 1 pack of cigarettes a day for 1 year (for example: 1 pack a day for 30 years or 2 packs a day for 15 years). Yearly screening should continue until the smoker has stopped smoking for at least 15 years. Yearly screening should be stopped for people who develop a health problem that would prevent them from having lung cancer treatment.  If you are pregnant, do not drink alcohol. If you are  breastfeeding, be very cautious about drinking alcohol. If you are not pregnant and choose to drink alcohol, do not have more than 1 drink per day. One drink is considered to be 12 ounces (355 mL) of beer, 5 ounces (148 mL) of wine, or 1.5 ounces (44 mL) of liquor.  Avoid use of street drugs. Do not share needles with anyone. Ask for help if you need support or instructions about stopping the use of drugs.  High blood pressure causes heart disease and increases the risk  of stroke. Your blood pressure should be checked at least every 1 to 2 years. Ongoing high blood pressure should be treated with medicines if weight loss and exercise do not work.  If you are 55-79 years old, ask your health care provider if you should take aspirin to prevent strokes.  Diabetes screening is done by taking a blood sample to check your blood glucose level after you have not eaten for a certain period of time (fasting). If you are not overweight and you do not have risk factors for diabetes, you should be screened once every 3 years starting at age 45. If you are overweight or obese and you are 40-70 years of age, you should be screened for diabetes every year as part of your cardiovascular risk assessment.  Breast cancer screening is essential preventive care for women. You should practice "breast self-awareness." This means understanding the normal appearance and feel of your breasts and may include breast self-examination. Any changes detected, no matter how small, should be reported to a health care provider. Women in their 20s and 30s should have a clinical breast exam (CBE) by a health care provider as part of a regular health exam every 1 to 3 years. After age 40, women should have a CBE every year. Starting at age 40, women should consider having a mammogram (breast X-ray test) every year. Women who have a family history of breast cancer should talk to their health care provider about genetic screening. Women at a high risk of breast cancer should talk to their health care providers about having an MRI and a mammogram every year.  Breast cancer gene (BRCA)-related cancer risk assessment is recommended for women who have family members with BRCA-related cancers. BRCA-related cancers include breast, ovarian, tubal, and peritoneal cancers. Having family members with these cancers may be associated with an increased risk for harmful changes (mutations) in the breast cancer genes BRCA1 and  BRCA2. Results of the assessment will determine the need for genetic counseling and BRCA1 and BRCA2 testing.  Your health care provider may recommend that you be screened regularly for cancer of the pelvic organs (ovaries, uterus, and vagina). This screening involves a pelvic examination, including checking for microscopic changes to the surface of your cervix (Pap test). You may be encouraged to have this screening done every 3 years, beginning at age 21.  For women ages 30-65, health care providers may recommend pelvic exams and Pap testing every 3 years, or they may recommend the Pap and pelvic exam, combined with testing for human papilloma virus (HPV), every 5 years. Some types of HPV increase your risk of cervical cancer. Testing for HPV may also be done on women of any age with unclear Pap test results.  Other health care providers may not recommend any screening for nonpregnant women who are considered low risk for pelvic cancer and who do not have symptoms. Ask your health care provider if a screening pelvic exam is right for   you.  If you have had past treatment for cervical cancer or a condition that could lead to cancer, you need Pap tests and screening for cancer for at least 20 years after your treatment. If Pap tests have been discontinued, your risk factors (such as having a new sexual partner) need to be reassessed to determine if screening should resume. Some women have medical problems that increase the chance of getting cervical cancer. In these cases, your health care provider may recommend more frequent screening and Pap tests.  Colorectal cancer can be detected and often prevented. Most routine colorectal cancer screening begins at the age of 50 years and continues through age 75 years. However, your health care provider may recommend screening at an earlier age if you have risk factors for colon cancer. On a yearly basis, your health care provider may provide home test kits to check  for hidden blood in the stool. Use of a small camera at the end of a tube, to directly examine the colon (sigmoidoscopy or colonoscopy), can detect the earliest forms of colorectal cancer. Talk to your health care provider about this at age 50, when routine screening begins. Direct exam of the colon should be repeated every 5-10 years through age 75 years, unless early forms of precancerous polyps or small growths are found.  People who are at an increased risk for hepatitis B should be screened for this virus. You are considered at high risk for hepatitis B if:  You were born in a country where hepatitis B occurs often. Talk with your health care provider about which countries are considered high risk.  Your parents were born in a high-risk country and you have not received a shot to protect against hepatitis B (hepatitis B vaccine).  You have HIV or AIDS.  You use needles to inject street drugs.  You live with, or have sex with, someone who has hepatitis B.  You get hemodialysis treatment.  You take certain medicines for conditions like cancer, organ transplantation, and autoimmune conditions.  Hepatitis C blood testing is recommended for all people born from 1945 through 1965 and any individual with known risks for hepatitis C.  Practice safe sex. Use condoms and avoid high-risk sexual practices to reduce the spread of sexually transmitted infections (STIs). STIs include gonorrhea, chlamydia, syphilis, trichomonas, herpes, HPV, and human immunodeficiency virus (HIV). Herpes, HIV, and HPV are viral illnesses that have no cure. They can result in disability, cancer, and death.  You should be screened for sexually transmitted illnesses (STIs) including gonorrhea and chlamydia if:  You are sexually active and are younger than 24 years.  You are older than 24 years and your health care provider tells you that you are at risk for this type of infection.  Your sexual activity has changed  since you were last screened and you are at an increased risk for chlamydia or gonorrhea. Ask your health care provider if you are at risk.  If you are at risk of being infected with HIV, it is recommended that you take a prescription medicine daily to prevent HIV infection. This is called preexposure prophylaxis (PrEP). You are considered at risk if:  You are sexually active and do not regularly use condoms or know the HIV status of your partner(s).  You take drugs by injection.  You are sexually active with a partner who has HIV.  Talk with your health care provider about whether you are at high risk of being infected with HIV. If   you choose to begin PrEP, you should first be tested for HIV. You should then be tested every 3 months for as long as you are taking PrEP.  Osteoporosis is a disease in which the bones lose minerals and strength with aging. This can result in serious bone fractures or breaks. The risk of osteoporosis can be identified using a bone density scan. Women ages 67 years and over and women at risk for fractures or osteoporosis should discuss screening with their health care providers. Ask your health care provider whether you should take a calcium supplement or vitamin D to reduce the rate of osteoporosis.  Menopause can be associated with physical symptoms and risks. Hormone replacement therapy is available to decrease symptoms and risks. You should talk to your health care provider about whether hormone replacement therapy is right for you.  Use sunscreen. Apply sunscreen liberally and repeatedly throughout the day. You should seek shade when your shadow is shorter than you. Protect yourself by wearing long sleeves, pants, a wide-brimmed hat, and sunglasses year round, whenever you are outdoors.  Once a month, do a whole body skin exam, using a mirror to look at the skin on your back. Tell your health care provider of new moles, moles that have irregular borders, moles that  are larger than a pencil eraser, or moles that have changed in shape or color.  Stay current with required vaccines (immunizations).  Influenza vaccine. All adults should be immunized every year.  Tetanus, diphtheria, and acellular pertussis (Td, Tdap) vaccine. Pregnant women should receive 1 dose of Tdap vaccine during each pregnancy. The dose should be obtained regardless of the length of time since the last dose. Immunization is preferred during the 27th-36th week of gestation. An adult who has not previously received Tdap or who does not know her vaccine status should receive 1 dose of Tdap. This initial dose should be followed by tetanus and diphtheria toxoids (Td) booster doses every 10 years. Adults with an unknown or incomplete history of completing a 3-dose immunization series with Td-containing vaccines should begin or complete a primary immunization series including a Tdap dose. Adults should receive a Td booster every 10 years.  Varicella vaccine. An adult without evidence of immunity to varicella should receive 2 doses or a second dose if she has previously received 1 dose. Pregnant females who do not have evidence of immunity should receive the first dose after pregnancy. This first dose should be obtained before leaving the health care facility. The second dose should be obtained 4-8 weeks after the first dose.  Human papillomavirus (HPV) vaccine. Females aged 13-26 years who have not received the vaccine previously should obtain the 3-dose series. The vaccine is not recommended for use in pregnant females. However, pregnancy testing is not needed before receiving a dose. If a female is found to be pregnant after receiving a dose, no treatment is needed. In that case, the remaining doses should be delayed until after the pregnancy. Immunization is recommended for any person with an immunocompromised condition through the age of 61 years if she did not get any or all doses earlier. During the  3-dose series, the second dose should be obtained 4-8 weeks after the first dose. The third dose should be obtained 24 weeks after the first dose and 16 weeks after the second dose.  Zoster vaccine. One dose is recommended for adults aged 30 years or older unless certain conditions are present.  Measles, mumps, and rubella (MMR) vaccine. Adults born  before 1957 generally are considered immune to measles and mumps. Adults born in 1957 or later should have 1 or more doses of MMR vaccine unless there is a contraindication to the vaccine or there is laboratory evidence of immunity to each of the three diseases. A routine second dose of MMR vaccine should be obtained at least 28 days after the first dose for students attending postsecondary schools, health care workers, or international travelers. People who received inactivated measles vaccine or an unknown type of measles vaccine during 1963-1967 should receive 2 doses of MMR vaccine. People who received inactivated mumps vaccine or an unknown type of mumps vaccine before 1979 and are at high risk for mumps infection should consider immunization with 2 doses of MMR vaccine. For females of childbearing age, rubella immunity should be determined. If there is no evidence of immunity, females who are not pregnant should be vaccinated. If there is no evidence of immunity, females who are pregnant should delay immunization until after pregnancy. Unvaccinated health care workers born before 1957 who lack laboratory evidence of measles, mumps, or rubella immunity or laboratory confirmation of disease should consider measles and mumps immunization with 2 doses of MMR vaccine or rubella immunization with 1 dose of MMR vaccine.  Pneumococcal 13-valent conjugate (PCV13) vaccine. When indicated, a person who is uncertain of his immunization history and has no record of immunization should receive the PCV13 vaccine. All adults 65 years of age and older should receive this  vaccine. An adult aged 19 years or older who has certain medical conditions and has not been previously immunized should receive 1 dose of PCV13 vaccine. This PCV13 should be followed with a dose of pneumococcal polysaccharide (PPSV23) vaccine. Adults who are at high risk for pneumococcal disease should obtain the PPSV23 vaccine at least 8 weeks after the dose of PCV13 vaccine. Adults older than 65 years of age who have normal immune system function should obtain the PPSV23 vaccine dose at least 1 year after the dose of PCV13 vaccine.  Pneumococcal polysaccharide (PPSV23) vaccine. When PCV13 is also indicated, PCV13 should be obtained first. All adults aged 65 years and older should be immunized. An adult younger than age 65 years who has certain medical conditions should be immunized. Any person who resides in a nursing home or long-term care facility should be immunized. An adult smoker should be immunized. People with an immunocompromised condition and certain other conditions should receive both PCV13 and PPSV23 vaccines. People with human immunodeficiency virus (HIV) infection should be immunized as soon as possible after diagnosis. Immunization during chemotherapy or radiation therapy should be avoided. Routine use of PPSV23 vaccine is not recommended for American Indians, [place] Natives, or people younger than 65 years unless there are medical conditions that require PPSV23 vaccine. When indicated, people who have unknown immunization and have no record of immunization should receive PPSV23 vaccine. One-time revaccination 5 years after the first dose of PPSV23 is recommended for people aged 19-64 years who have chronic kidney failure, nephrotic syndrome, asplenia, or immunocompromised conditions. People who received 1-2 doses of PPSV23 before age 65 years should receive another dose of PPSV23 vaccine at age 65 years or later if at least 5 years have passed since the previous dose. Doses of PPSV23 are not  needed for people immunized with PPSV23 at or after age 65 years.  Meningococcal vaccine. Adults with asplenia or persistent complement component deficiencies should receive 2 doses of quadrivalent meningococcal conjugate (MenACWY-D) vaccine. The doses should be obtained   at least 2 months apart. Microbiologists working with certain meningococcal bacteria, [place] recruits, people at risk during an outbreak, and people who travel to or live in countries with a high rate of meningitis should be immunized. A first-year college student up through age 34 years who is living in a residence hall should receive a dose if she did not receive a dose on or after her 16th birthday. Adults who have certain high-risk conditions should receive one or more doses of vaccine.  Hepatitis A vaccine. Adults who wish to be protected from this disease, have certain high-risk conditions, work with hepatitis A-infected animals, work in hepatitis A research labs, or travel to or work in countries with a high rate of hepatitis A should be immunized. Adults who were previously unvaccinated and who anticipate close contact with an international adoptee during the first 60 days after arrival in the [place] from a country with a high rate of hepatitis A should be immunized.  Hepatitis B vaccine. Adults who wish to be protected from this disease, have certain high-risk conditions, may be exposed to blood or other infectious body fluids, are household contacts or sex partners of hepatitis B positive people, are clients or workers in certain care facilities, or travel to or work in countries with a high rate of hepatitis B should be immunized.  Haemophilus influenzae type b (Hib) vaccine. A previously unvaccinated person with asplenia or sickle cell disease or having a scheduled splenectomy should receive 1 dose of Hib vaccine. Regardless of previous immunization, a recipient of a hematopoietic stem cell transplant should receive a  3-dose series 6-12 months after her successful transplant. Hib vaccine is not recommended for adults with HIV infection. Preventive Services / Frequency Ages 35 to 4 years  Blood pressure check.** / Every 3-5 years.  Lipid and cholesterol check.** / Every 5 years beginning at age 60.  Clinical breast exam.** / Every 3 years for women in their 71s and 10s.  BRCA-related cancer risk assessment.** / For women who have family members with a BRCA-related cancer (breast, ovarian, tubal, or peritoneal cancers).  Pap test.** / Every 2 years from ages 76 through 26. Every 3 years starting at age 61 through age 76 or 93 with a history of 3 consecutive normal Pap tests.  HPV screening.** / Every 3 years from ages 37 through ages 60 to 51 with a history of 3 consecutive normal Pap tests.  Hepatitis C blood test.** / For any individual with known risks for hepatitis C.  Skin self-exam. / Monthly.  Influenza vaccine. / Every year.  Tetanus, diphtheria, and acellular pertussis (Tdap, Td) vaccine.** / Consult your health care provider. Pregnant women should receive 1 dose of Tdap vaccine during each pregnancy. 1 dose of Td every 10 years.  Varicella vaccine.** / Consult your health care provider. Pregnant females who do not have evidence of immunity should receive the first dose after pregnancy.  HPV vaccine. / 3 doses over 6 months, if 93 and younger. The vaccine is not recommended for use in pregnant females. However, pregnancy testing is not needed before receiving a dose.  Measles, mumps, rubella (MMR) vaccine.** / You need at least 1 dose of MMR if you were born in 1957 or later. You may also need a 2nd dose. For females of childbearing age, rubella immunity should be determined. If there is no evidence of immunity, females who are not pregnant should be vaccinated. If there is no evidence of immunity, females who are  pregnant should delay immunization until after pregnancy.  Pneumococcal  13-valent conjugate (PCV13) vaccine.** / Consult your health care provider.  Pneumococcal polysaccharide (PPSV23) vaccine.** / 1 to 2 doses if you smoke cigarettes or if you have certain conditions.  Meningococcal vaccine.** / 1 dose if you are age 68 to 8 years and a Market researcher living in a residence hall, or have one of several medical conditions, you need to get vaccinated against meningococcal disease. You may also need additional booster doses.  Hepatitis A vaccine.** / Consult your health care provider.  Hepatitis B vaccine.** / Consult your health care provider.  Haemophilus influenzae type b (Hib) vaccine.** / Consult your health care provider. Ages 7 to 53 years  Blood pressure check.** / Every year.  Lipid and cholesterol check.** / Every 5 years beginning at age 25 years.  Lung cancer screening. / Every year if you are aged 11-80 years and have a 30-pack-year history of smoking and currently smoke or have quit within the past 15 years. Yearly screening is stopped once you have quit smoking for at least 15 years or develop a health problem that would prevent you from having lung cancer treatment.  Clinical breast exam.** / Every year after age 48 years.  BRCA-related cancer risk assessment.** / For women who have family members with a BRCA-related cancer (breast, ovarian, tubal, or peritoneal cancers).  Mammogram.** / Every year beginning at age 41 years and continuing for as long as you are in good health. Consult with your health care provider.  Pap test.** / Every 3 years starting at age 65 years through age 37 or 70 years with a history of 3 consecutive normal Pap tests.  HPV screening.** / Every 3 years from ages 72 years through ages 60 to 40 years with a history of 3 consecutive normal Pap tests.  Fecal occult blood test (FOBT) of stool. / Every year beginning at age 21 years and continuing until age 5 years. You may not need to do this test if you get  a colonoscopy every 10 years.  Flexible sigmoidoscopy or colonoscopy.** / Every 5 years for a flexible sigmoidoscopy or every 10 years for a colonoscopy beginning at age 35 years and continuing until age 48 years.  Hepatitis C blood test.** / For all people born from [birth year] through 1965 and any individual with known risks for hepatitis C.  Skin self-exam. / Monthly.  Influenza vaccine. / Every year.  Tetanus, diphtheria, and acellular pertussis (Tdap/Td) vaccine.** / Consult your health care provider. Pregnant women should receive 1 dose of Tdap vaccine during each pregnancy. 1 dose of Td every 10 years.  Varicella vaccine.** / Consult your health care provider. Pregnant females who do not have evidence of immunity should receive the first dose after pregnancy.  Zoster vaccine.** / 1 dose for adults aged 30 years or older.  Measles, mumps, rubella (MMR) vaccine.** / You need at least 1 dose of MMR if you were born in 1957 or later. You may also need a second dose. For females of childbearing age, rubella immunity should be determined. If there is no evidence of immunity, females who are not pregnant should be vaccinated. If there is no evidence of immunity, females who are pregnant should delay immunization until after pregnancy.  Pneumococcal 13-valent conjugate (PCV13) vaccine.** / Consult your health care provider.  Pneumococcal polysaccharide (PPSV23) vaccine.** / 1 to 2 doses if you smoke cigarettes or if you have certain conditions.  Meningococcal vaccine.** /  Consult your health care provider.  Hepatitis A vaccine.** / Consult your health care provider.  Hepatitis B vaccine.** / Consult your health care provider.  Haemophilus influenzae type b (Hib) vaccine.** / Consult your health care provider. Ages 89 years and over  Blood pressure check.** / Every year.  Lipid and cholesterol check.** / Every 5 years beginning at age 56 years.  Lung cancer screening. / Every year if you  are aged 47-80 years and have a 30-pack-year history of smoking and currently smoke or have quit within the past 15 years. Yearly screening is stopped once you have quit smoking for at least 15 years or develop a health problem that would prevent you from having lung cancer treatment.  Clinical breast exam.** / Every year after age 46 years.  BRCA-related cancer risk assessment.** / For women who have family members with a BRCA-related cancer (breast, ovarian, tubal, or peritoneal cancers).  Mammogram.** / Every year beginning at age 31 years and continuing for as long as you are in good health. Consult with your health care provider.  Pap test.** / Every 3 years starting at age 51 years through age 54 or 81 years with 3 consecutive normal Pap tests. Testing can be stopped between 65 and 70 years with 3 consecutive normal Pap tests and no abnormal Pap or HPV tests in the past 10 years.  HPV screening.** / Every 3 years from ages 57 years through ages 9 or 3 years with a history of 3 consecutive normal Pap tests. Testing can be stopped between 65 and 70 years with 3 consecutive normal Pap tests and no abnormal Pap or HPV tests in the past 10 years.  Fecal occult blood test (FOBT) of stool. / Every year beginning at age 68 years and continuing until age 2 years. You may not need to do this test if you get a colonoscopy every 10 years.  Flexible sigmoidoscopy or colonoscopy.** / Every 5 years for a flexible sigmoidoscopy or every 10 years for a colonoscopy beginning at age 80 years and continuing until age 56 years.  Hepatitis C blood test.** / For all people born from [birth year] through 1965 and any individual with known risks for hepatitis C.  Osteoporosis screening.** / A one-time screening for women ages 71 years and over and women at risk for fractures or osteoporosis.  Skin self-exam. / Monthly.  Influenza vaccine. / Every year.  Tetanus, diphtheria, and acellular pertussis (Tdap/Td)  vaccine.** / 1 dose of Td every 10 years.  Varicella vaccine.** / Consult your health care provider.  Zoster vaccine.** / 1 dose for adults aged 45 years or older.  Pneumococcal 13-valent conjugate (PCV13) vaccine.** / Consult your health care provider.  Pneumococcal polysaccharide (PPSV23) vaccine.** / 1 dose for all adults aged 94 years and older.  Meningococcal vaccine.** / Consult your health care provider.  Hepatitis A vaccine.** / Consult your health care provider.  Hepatitis B vaccine.** / Consult your health care provider.  Haemophilus influenzae type b (Hib) vaccine.** / Consult your health care provider. ** Family history and personal history of risk and conditions may change your health care provider's recommendations.   This information is not intended to replace advice given to you by your health care provider. Make sure you discuss any questions you have with your health care provider.   Document Released: 01/03/2002 Document Revised: 11/28/2014 Document Reviewed: 04/04/2011 Elsevier Interactive Patient Education 2016 [place] DASH stands for "Dietary Approaches to Stop Hypertension."  The DASH eating plan is a healthy eating plan that has been shown to reduce high blood pressure (hypertension). Additional health benefits may include reducing the risk of type 2 diabetes mellitus, heart disease, and stroke. The DASH eating plan may also help with weight loss. WHAT DO I NEED TO KNOW ABOUT THE DASH EATING PLAN? For the DASH eating plan, you will follow these general guidelines:  Choose foods with a percent daily value for sodium of less than 5% (as listed on the food label).  Use salt-free seasonings or herbs instead of table salt or sea salt.  Check with your health care provider or pharmacist before using salt substitutes.  Eat lower-sodium products, often labeled as "lower sodium" or "no salt added."  Eat fresh foods.  Eat more vegetables,  fruits, and low-fat dairy products.  Choose whole grains. Look for the word "whole" as the first word in the ingredient list.  Choose fish and skinless chicken or [place] more often than red meat. Limit fish, poultry, and meat to 6 oz (170 g) each day.  Limit sweets, desserts, sugars, and sugary drinks.  Choose heart-healthy fats.  Limit cheese to 1 oz (28 g) per day.  Eat more home-cooked food and less restaurant, buffet, and fast food.  Limit fried foods.  Cook foods using methods other than frying.  Limit canned vegetables. If you do use them, rinse them well to decrease the sodium.  When eating at a restaurant, ask that your food be prepared with less salt, or no salt if possible. WHAT FOODS CAN I EAT? Seek help from a dietitian for individual calorie needs. Grains Whole grain or whole wheat bread. Brown rice. Whole grain or whole wheat pasta. Quinoa, bulgur, and whole grain cereals. Low-sodium cereals. Corn or whole wheat flour tortillas. Whole grain cornbread. Whole grain crackers. Low-sodium crackers. Vegetables Fresh or frozen vegetables (raw, steamed, roasted, or grilled). Low-sodium or reduced-sodium tomato and vegetable juices. Low-sodium or reduced-sodium tomato sauce and paste. Low-sodium or reduced-sodium canned vegetables.  Fruits All fresh, canned (in natural juice), or frozen fruits. Meat and Other Protein Products Ground beef (85% or leaner), grass-fed beef, or beef trimmed of fat. Skinless chicken or [place]. Ground chicken or [place]. Pork trimmed of fat. All fish and seafood. Eggs. Dried beans, peas, or lentils. Unsalted nuts and seeds. Unsalted canned beans. Dairy Low-fat dairy products, such as skim or 1% milk, 2% or reduced-fat cheeses, low-fat ricotta or cottage cheese, or plain low-fat yogurt. Low-sodium or reduced-sodium cheeses. Fats and Oils Tub margarines without trans fats. Light or reduced-fat mayonnaise and salad dressings (reduced sodium). Avocado.  Safflower, olive, or canola oils. Natural peanut or almond butter. Other Unsalted popcorn and pretzels. The items listed above may not be a complete list of recommended foods or beverages. Contact your dietitian for more options. WHAT FOODS ARE NOT RECOMMENDED? Grains White bread. White pasta. White rice. Refined cornbread. Bagels and croissants. Crackers that contain trans fat. Vegetables Creamed or fried vegetables. Vegetables in a cheese sauce. Regular canned vegetables. Regular canned tomato sauce and paste. Regular tomato and vegetable juices. Fruits Dried fruits. Canned fruit in light or heavy syrup. Fruit juice. Meat and Other Protein Products Fatty cuts of meat. Ribs, chicken wings, bacon, sausage, bologna, salami, chitterlings, fatback, hot dogs, bratwurst, and packaged luncheon meats. Salted nuts and seeds. Canned beans with salt. Dairy Whole or 2% milk, cream, half-and-half, and cream cheese. Whole-fat or sweetened yogurt. Full-fat cheeses or blue cheese. Nondairy creamers and whipped toppings. Processed  cheese spreads, or cheese curds. Condiments Onion and garlic salt, seasoned salt, table salt, and sea salt. Canned and packaged gravies. Worcestershire sauce. Tartar sauce. Barbecue sauce. Teriyaki sauce. Soy sauce, including reduced sodium. Steak sauce. Fish sauce. Oyster sauce. Cocktail sauce. Horseradish. Ketchup and mustard. Meat flavorings and tenderizers. Bouillon cubes. Hot sauce. Tabasco sauce. Marinades. Taco seasonings. Relishes. Fats and Oils Butter, stick margarine, lard, shortening, ghee, and bacon fat. Coconut, palm kernel, or palm oils. Regular salad dressings. Other Pickles and olives. Salted popcorn and pretzels. The items listed above may not be a complete list of foods and beverages to avoid. Contact your dietitian for more information. WHERE CAN I FIND MORE INFORMATION? National Heart, Lung, and Blood Institute:  www.nhlbi.nih.gov/health/health-topics/topics/dash/   This information is not intended to replace advice given to you by your health care provider. Make sure you discuss any questions you have with your health care provider.   Document Released: 10/27/2011 Document Revised: 11/28/2014 Document Reviewed: 09/11/2013 Elsevier Interactive Patient Education 2016 Elsevier Inc.  

## 2016-06-03 ENCOUNTER — Other Ambulatory Visit: Payer: Medicare Other

## 2016-06-06 ENCOUNTER — Other Ambulatory Visit (INDEPENDENT_AMBULATORY_CARE_PROVIDER_SITE_OTHER): Payer: Medicare Other

## 2016-06-06 DIAGNOSIS — I10 Essential (primary) hypertension: Secondary | ICD-10-CM

## 2016-06-06 DIAGNOSIS — R319 Hematuria, unspecified: Secondary | ICD-10-CM

## 2016-06-06 DIAGNOSIS — E781 Pure hyperglyceridemia: Secondary | ICD-10-CM

## 2016-06-06 LAB — COMPREHENSIVE METABOLIC PANEL
ALT: 20 U/L (ref 0–35)
AST: 18 U/L (ref 0–37)
Albumin: 4.2 g/dL (ref 3.5–5.2)
Alkaline Phosphatase: 73 U/L (ref 39–117)
BILIRUBIN TOTAL: 0.6 mg/dL (ref 0.2–1.2)
BUN: 15 mg/dL (ref 6–23)
CO2: 27 meq/L (ref 19–32)
Calcium: 9.3 mg/dL (ref 8.4–10.5)
Chloride: 104 mEq/L (ref 96–112)
Creatinine, Ser: 0.67 mg/dL (ref 0.40–1.20)
GFR: 93.8 mL/min (ref >60.00)
GLUCOSE: 95 mg/dL (ref 70–99)
Potassium: 3.9 mEq/L (ref 3.5–5.1)
SODIUM: 138 meq/L (ref 135–145)
Total Protein: 7.1 g/dL (ref 6.0–8.3)

## 2016-06-06 LAB — POCT URINALYSIS DIPSTICK
Bilirubin, UA: NEGATIVE
Glucose, UA: NEGATIVE
Ketones, UA: NEGATIVE
LEUKOCYTES UA: NEGATIVE
NITRITE UA: NEGATIVE
PH UA: 6
Protein, UA: NEGATIVE
Spec Grav, UA: >=1.03
UROBILINOGEN UA: 0.2

## 2016-06-06 LAB — LIPID PANEL
Cholesterol: 229 mg/dL — ABNORMAL HIGH (ref 0–200)
HDL: 44.5 mg/dL (ref >39.00)
NonHDL: 184.6
Total CHOL/HDL Ratio: 5
Triglycerides: 255 mg/dL — ABNORMAL HIGH (ref 0.0–149.0)
VLDL: 51 mg/dL — ABNORMAL HIGH (ref 0.0–40.0)

## 2016-06-06 LAB — LDL CHOLESTEROL, DIRECT: LDL DIRECT: 153 mg/dL

## 2016-06-06 NOTE — Addendum Note (Signed)
Addended by: Harley AltoPRICE, KRISTY M on: 06/06/2016 11:21 AM   Modules accepted: Orders

## 2016-06-07 LAB — CBC WITH DIFFERENTIAL/PLATELET
BASOS ABS: 0.1 10*3/uL (ref 0.0–0.1)
Basophils Relative: 0.7 % (ref 0.0–3.0)
Eosinophils Absolute: 0.1 10*3/uL (ref 0.0–0.7)
Eosinophils Relative: 1.4 % (ref 0.0–5.0)
HCT: 42.7 % (ref 36.0–46.0)
Hemoglobin: 14.8 g/dL (ref 12.0–15.0)
LYMPHS ABS: 1.7 10*3/uL (ref 0.7–4.0)
Lymphocytes Relative: 18.8 % (ref 12.0–46.0)
MCHC: 34.5 g/dL (ref 30.0–36.0)
MCV: 89.7 fl (ref 78.0–100.0)
MONOS PCT: 8.4 % (ref 3.0–12.0)
Monocytes Absolute: 0.8 10*3/uL (ref 0.1–1.0)
NEUTROS ABS: 6.4 10*3/uL (ref 1.4–7.7)
NEUTROS PCT: 70.7 % (ref 43.0–77.0)
PLATELETS: 279 10*3/uL (ref 150.0–400.0)
RBC: 4.76 Mil/uL (ref 3.87–5.11)
RDW: 13.5 % (ref 11.5–15.5)
WBC: 9.1 10*3/uL (ref 4.0–10.5)

## 2016-06-08 LAB — URINE CULTURE: Organism ID, Bacteria: NO GROWTH

## 2016-06-09 DIAGNOSIS — M9901 Segmental and somatic dysfunction of cervical region: Secondary | ICD-10-CM | POA: Diagnosis not present

## 2016-06-09 DIAGNOSIS — M546 Pain in thoracic spine: Secondary | ICD-10-CM | POA: Diagnosis not present

## 2016-06-09 DIAGNOSIS — M542 Cervicalgia: Secondary | ICD-10-CM | POA: Diagnosis not present

## 2016-06-09 DIAGNOSIS — M9902 Segmental and somatic dysfunction of thoracic region: Secondary | ICD-10-CM | POA: Diagnosis not present

## 2016-06-14 DIAGNOSIS — M9901 Segmental and somatic dysfunction of cervical region: Secondary | ICD-10-CM | POA: Diagnosis not present

## 2016-06-14 DIAGNOSIS — M9902 Segmental and somatic dysfunction of thoracic region: Secondary | ICD-10-CM | POA: Diagnosis not present

## 2016-06-14 DIAGNOSIS — M542 Cervicalgia: Secondary | ICD-10-CM | POA: Diagnosis not present

## 2016-06-14 DIAGNOSIS — M546 Pain in thoracic spine: Secondary | ICD-10-CM | POA: Diagnosis not present

## 2016-06-22 ENCOUNTER — Telehealth: Payer: Self-pay | Admitting: Family Medicine

## 2016-06-22 DIAGNOSIS — R911 Solitary pulmonary nodule: Secondary | ICD-10-CM

## 2016-06-22 NOTE — Telephone Encounter (Signed)
Pt called in to see if PCP could suggest a plastic surgeon and lung dr in this area. Pt says that she is new to the area.    CB: [phone]    ALSO, pt would like to have her lab results mailed out to her.

## 2016-06-23 DIAGNOSIS — M542 Cervicalgia: Secondary | ICD-10-CM | POA: Diagnosis not present

## 2016-06-23 DIAGNOSIS — M9902 Segmental and somatic dysfunction of thoracic region: Secondary | ICD-10-CM | POA: Diagnosis not present

## 2016-06-23 DIAGNOSIS — M9901 Segmental and somatic dysfunction of cervical region: Secondary | ICD-10-CM | POA: Diagnosis not present

## 2016-06-23 DIAGNOSIS — M546 Pain in thoracic spine: Secondary | ICD-10-CM | POA: Diagnosis not present

## 2016-06-23 NOTE — Telephone Encounter (Signed)
Please advise      KP 

## 2016-06-23 NOTE — Telephone Encounter (Signed)
She can go to [facility]--- but we would need to see her if she needs a referral  Plastic surgey---- [facility]-- Dr Kelly Splinter or [place]

## 2016-06-24 DIAGNOSIS — Z1211 Encounter for screening for malignant neoplasm of colon: Secondary | ICD-10-CM | POA: Diagnosis not present

## 2016-06-24 DIAGNOSIS — K5730 Diverticulosis of large intestine without perforation or abscess without bleeding: Secondary | ICD-10-CM | POA: Diagnosis not present

## 2016-06-24 NOTE — Telephone Encounter (Signed)
Patient was give the information for the plastic surgeon. Ref placed for pulmonary for a follow up of a 1 cm right lower lobe nodule. Non smoker.     KP

## 2016-06-30 DIAGNOSIS — L700 Acne vulgaris: Secondary | ICD-10-CM | POA: Diagnosis not present

## 2016-07-05 DIAGNOSIS — M546 Pain in thoracic spine: Secondary | ICD-10-CM | POA: Diagnosis not present

## 2016-07-05 DIAGNOSIS — M542 Cervicalgia: Secondary | ICD-10-CM | POA: Diagnosis not present

## 2016-07-05 DIAGNOSIS — M9902 Segmental and somatic dysfunction of thoracic region: Secondary | ICD-10-CM | POA: Diagnosis not present

## 2016-07-05 DIAGNOSIS — M9901 Segmental and somatic dysfunction of cervical region: Secondary | ICD-10-CM | POA: Diagnosis not present

## 2016-07-12 DIAGNOSIS — M9902 Segmental and somatic dysfunction of thoracic region: Secondary | ICD-10-CM | POA: Diagnosis not present

## 2016-07-12 DIAGNOSIS — M9901 Segmental and somatic dysfunction of cervical region: Secondary | ICD-10-CM | POA: Diagnosis not present

## 2016-07-12 DIAGNOSIS — M542 Cervicalgia: Secondary | ICD-10-CM | POA: Diagnosis not present

## 2016-07-12 DIAGNOSIS — M546 Pain in thoracic spine: Secondary | ICD-10-CM | POA: Diagnosis not present

## 2016-08-02 DIAGNOSIS — L700 Acne vulgaris: Secondary | ICD-10-CM | POA: Diagnosis not present

## 2016-08-25 ENCOUNTER — Ambulatory Visit (INDEPENDENT_AMBULATORY_CARE_PROVIDER_SITE_OTHER): Payer: Medicare Other | Admitting: Medical

## 2016-08-25 ENCOUNTER — Encounter: Payer: Self-pay | Admitting: Pulmonary Disease

## 2016-08-25 ENCOUNTER — Ambulatory Visit (INDEPENDENT_AMBULATORY_CARE_PROVIDER_SITE_OTHER): Payer: Medicare Other | Admitting: Pulmonary Disease

## 2016-08-25 ENCOUNTER — Encounter: Payer: Self-pay | Admitting: Medical

## 2016-08-25 VITALS — BP 132/82 | HR 88 | Temp 97.7°F | Ht 60.0 in | Wt 143.0 lb

## 2016-08-25 VITALS — BP 132/85 | HR 82 | Ht 60.0 in | Wt 143.0 lb

## 2016-08-25 DIAGNOSIS — J310 Chronic rhinitis: Secondary | ICD-10-CM | POA: Diagnosis not present

## 2016-08-25 DIAGNOSIS — T485X5A Adverse effect of other anti-common-cold drugs, initial encounter: Secondary | ICD-10-CM

## 2016-08-25 DIAGNOSIS — T485X1A Poisoning by other anti-common-cold drugs, accidental (unintentional), initial encounter: Secondary | ICD-10-CM

## 2016-08-25 DIAGNOSIS — R918 Other nonspecific abnormal finding of lung field: Secondary | ICD-10-CM | POA: Diagnosis not present

## 2016-08-25 DIAGNOSIS — R911 Solitary pulmonary nodule: Secondary | ICD-10-CM

## 2016-08-25 DIAGNOSIS — J302 Other seasonal allergic rhinitis: Secondary | ICD-10-CM | POA: Diagnosis not present

## 2016-08-25 MED ORDER — PREDNISONE 10 MG PO TABS: ORAL_TABLET | ORAL | 0 refills | Status: DC

## 2016-08-25 MED ORDER — FLUTICASONE PROPIONATE 50 MCG/ACT NA SUSP: 2.0000 | Freq: Every day | NASAL | 1 refills | Status: DC

## 2016-08-25 MED ORDER — METHYLPREDNISOLONE ACETATE 40 MG/ML IJ SUSP
40.0000 mg | Freq: Once | INTRAMUSCULAR | Status: AC
Start: 2016-08-25 — End: 2016-08-25
  Administered 2016-08-25: 40 mg via INTRAMUSCULAR

## 2016-08-25 MED ORDER — LORATADINE 10 MG PO TABS: 10.0000 mg | ORAL_TABLET | Freq: Every day | ORAL | 0 refills | Status: DC

## 2016-08-25 MED FILL — predniSONE 10 MG TABS: 10 | 3 days supply | Qty: 6 | Fill #0

## 2016-08-25 MED FILL — FLUTICASONE PROP 50 MCG SPR: 50 | 30 days supply | Qty: 16 | Fill #0

## 2016-08-25 NOTE — Patient Instructions (Addendum)
For allergic rhinitis and rhinitis medicamentosa we gave depomedrol 40 mg im and 3 day taper course of prednisone. I think best to treat in attempt to open your nasal passages up as you have been on afrin for extended period of time.   Continue claritin 10 mg daily and your singulair. Steroid nasal spray as well. flonase rx  I don't think by exam you have sinus infection presently. If you develop increasing sinus pain let us know.  Follow up in 7 days or as needed

## 2016-08-25 NOTE — Progress Notes (Signed)
Subjective:    Patient ID: Erin Acosta, female    DOB: 11-29-1950, 65 y.o.   MRN: [id number]  HPI   Chief Complaint  Patient presents with  . Pulmonary Consult    Referred by Dr. Hulan SaasLownes; Had Mammogram 1 year ago, came back abnormal, found a spot on lungs during test.     65 year old never smoker presents for evaluation of pulmonary nodule. [place] moved [place] to [place] in [place]. As part of her mammogram she was noted to have right lower lobe nodule. She saw a pulmonologist in [place], [place] and I have reviewed her records from their including office notes chest x-ray and CT scans. CT chest 10/2014 showed several tiny lung nodules largest was a right lower lobe about 1 cm. This seemed to be stable when compared to a chest x-ray from 04/2012 Follow-up CT scan chest without contrast in 03/2015 showed stable right lower lobe pneumonia nodule measuring 11 mm. Serial follow up was recommended  She denies dyspnea, hemoptysis, fevers or weight loss. Her husband was in the [employer] and she lived in various states [place], then lived in [place] and finally [place] for many years. She worked in [employer] business before retirement  She reports seasonal allergies for which she takes Claritin and Programmer, systemslonase and Singulair. Hypertension is well controlled    Past Medical History:  Diagnosis Date  . Allergy   . Anxiety   . Depression   . Genital herpes   . H/O cesarean section   . Hypertension   . Rotator cuff tear   . Scoliosis      Past Surgical History:  Procedure Laterality Date  . BILATERAL CARPAL TUNNEL RELEASE    . BLEPHAROPLASTY    . CESAREAN SECTION    . FOOT SURGERY Right   . mini face lift    . TRIGGER FINGER RELEASE    . tummy tuck      No Known Allergies  Social History   Social History  . Marital status: Widowed    Spouse name: N/A  . Number of children: N/A  . Years of education: N/A   Occupational History  .  Not on file.   Social History Main Topics  . Smoking status: Never Smoker  . Smokeless tobacco: Never Used  . Alcohol use 0.6 oz/week    1 Standard drinks or equivalent per week     Comment: Wine in the evening  . Drug use: No  . Sexual activity: Not on file   Other Topics Concern  . Not on file   Social History Narrative  . No narrative on file     Family History  Problem Relation Age of Onset  . Arthritis    . Hyperlipidemia    . Heart disease    . Stroke    . Hypertension       Review of Systems  Constitutional: Negative for chills, fever and unexpected weight change.  HENT: Negative for congestion, dental problem, ear pain, nosebleeds, postnasal drip, rhinorrhea, sinus pressure, sneezing, sore throat, trouble swallowing and voice change.   Eyes: Negative for visual disturbance.  Respiratory: Negative for cough and choking.   Cardiovascular: Negative for chest pain and leg swelling.  Gastrointestinal: Negative for abdominal pain, diarrhea and vomiting.  Genitourinary: Negative for difficulty urinating.  Musculoskeletal: Negative for arthralgias.  Skin: Negative for rash.  Neurological: Negative for tremors, syncope and headaches.  Hematological: Does not bruise/bleed easily.  Objective:   Physical Exam  Gen. Pleasant, well-nourished, in no distress, normal affect ENT - no lesions, no post nasal drip Neck: No JVD, no thyromegaly, no carotid bruits Lungs: no use of accessory muscles, no dullness to percussion, clear without rales or rhonchi  Cardiovascular: Rhythm regular, heart sounds  normal, no murmurs or gallops, no peripheral edema Abdomen: soft and non-tender, no hepatosplenomegaly, BS normal. Musculoskeletal: No deformities, no cyanosis or clubbing Neuro:  alert, non focal        Assessment & Plan:

## 2016-08-25 NOTE — Assessment & Plan Note (Signed)
The dominant nodule is in the right lower lobe and appears to have been stable since 04/2012. We will obtain interval CT scan to demonstrate stability. 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 We would ensure a 5 year follow-up for this nodule

## 2016-08-25 NOTE — Progress Notes (Signed)
Subjective:    Patient ID: Erin Acosta, female    DOB: December 18, 1950, 65 y.o.   MRN: [id number]  HPI   Pt in for some recent nasal congestion. Pt is on singular medication. [place] a lot [place] in [place].   Pt moved from [place] to [facility] August 2016.   Pt had nasal congestion, runny nose, and some sneezing. Last year in [place] she did have some allergies but not as bad as this year. Pt recently adopted a cat. Pt has been using afrin twice a day over last month. Pt is aware of rebound nasal congestion.   Pt has been using some claritin as well.   Pt has tried various antihistames in the past. Pt did not like way she felt with xyzal.   Pt is not diabetic.      Review of Systems  Constitutional: Negative for chills, fatigue and fever.  HENT: Positive for congestion, postnasal drip and sneezing. Negative for ear pain, mouth sores, sore throat and trouble swallowing.   Respiratory: Negative for cough, chest tightness, shortness of breath and wheezing.   Cardiovascular: Negative for chest pain and palpitations.  Gastrointestinal: Negative for abdominal pain.  Musculoskeletal: Negative for back pain.  Skin: Negative for rash.  Neurological: Negative for dizziness and headaches.  Hematological: Negative for adenopathy. Does not bruise/bleed easily.  Psychiatric/Behavioral: Negative for behavioral problems and confusion.    Past Medical History:  Diagnosis Date  . Allergy   . Anxiety   . Depression   . Genital herpes   . H/O cesarean section   . Hypertension   . Rotator cuff tear   . Scoliosis      Social History   Social History  . Marital status: Widowed    Spouse name: N/A  . Number of children: N/A  . Years of education: N/A   Occupational History  . Not on file.   Social History Main Topics  . Smoking status: Never Smoker  . Smokeless tobacco: Never Used  . Alcohol use 0.6 oz/week    1 Standard drinks or equivalent per week     Comment: Wine in the evening  .  Drug use: No  . Sexual activity: Not on file   Other Topics Concern  . Not on file   Social History Narrative  . No narrative on file    Past Surgical History:  Procedure Laterality Date  . BILATERAL CARPAL TUNNEL RELEASE    . BLEPHAROPLASTY    . CESAREAN SECTION    . FOOT SURGERY Right   . mini face lift    . TRIGGER FINGER RELEASE    . tummy tuck      Family History  Problem Relation Age of Onset  . Arthritis    . Hyperlipidemia    . Heart disease    . Stroke    . Hypertension      No Known Allergies  Current Outpatient Prescriptions on File Prior to Visit  Medication Sig Dispense Refill  . aspirin EC 81 MG tablet Take 81 mg by mouth daily.    Marland Kitchen buPROPion (WELLBUTRIN XL) 300 MG 24 hr tablet Take 300 mg by mouth daily.    . clonazePAM (KLONOPIN) 1 MG tablet Take 1 tablet (1 mg total) by mouth daily as needed. 90 tablet 1  . HYDROcodone-acetaminophen (NORCO/VICODIN) 5-325 MG tablet Take 1 tablet by mouth every 6 (six) hours as needed for moderate pain. 30 tablet 0  . ISOtretinoin (ACCUTANE) 40 MG  capsule Take 40 mg by mouth 2 (two) times daily.    Marland Kitchen. lisinopril (PRINIVIL,ZESTRIL) 5 MG tablet Take 1 tablet (5 mg total) by mouth daily.    . montelukast (SINGULAIR) 10 MG tablet Take 1 tablet (10 mg total) by mouth daily.    . Multiple Vitamin (MULTIVITAMIN) capsule Take 1 capsule by mouth daily.    . valACYclovir (VALTREX) 500 MG tablet Take 500 mg by mouth daily.     No current facility-administered medications on file prior to visit.     BP 132/82   Pulse 88   Temp 97.7 F (36.5 C) (Oral)   Ht 5' (1.524 m)   Wt 143 lb (64.9 kg)   SpO2 100%   BMI 27.93 kg/m       Objective:   Physical Exam   General  Mental Status - Alert. General Appearance - Well groomed. Not in acute distress.  Skin Rashes- No Rashes.  HEENT Head- Normal. Ear Auditory Canal - Left- Normal. Right - Normal.Tympanic Membrane- Left- Normal. Right- Normal. Eye Sclera/Conjunctiva-  Left- Normal. Right- Normal. Nose & Sinuses Nasal Mucosa- Left-  Boggy and Congested. Right-  Boggy and  Congested.Bilateral  No maxillary and no  frontal sinus pressure. Mouth & Throat Lips: Upper Lip- Normal: no dryness, cracking, pallor, cyanosis, or vesicular eruption. Lower Lip-Normal: no dryness, cracking, pallor, cyanosis or vesicular eruption. Buccal Mucosa- Bilateral- No Aphthous ulcers. Oropharynx- No Discharge or Erythema. +pnd. Tonsils: Characteristics- Bilateral- No Erythema or Congestion. Size/Enlargement- Bilateral- No enlargement. Discharge- bilateral-None.  Neck Neck- Supple. No Masses.   Chest and Lung Exam Auscultation: Breath Sounds:-Clear even and unlabored.  Cardiovascular Auscultation:Rythm- Regular, rate and rhythm. Murmurs & Other Heart Sounds:Ausculatation of the heart reveal- No Murmurs.  Lymphatic Head & Neck General Head & Neck Lymphatics: Bilateral: Description- No Localized lymphadenopathy.      Assessment & Plan:  For allergic rhinitis and rhinitis medicamentosa we gave depomedrol 40 mg im and 3 day taper course of prednisone. I think best to treat in attempt to open your nasal passages up as you have been on afrin for extended period of time.   Continue claritin 10 mg daily and your singulair. Steroid nasal spray as well.  I don't think by exam you have sinus infection presently. If you develop increasing sinus pain let us know.  Follow up in 7 days or as needed  Rik Wadel, Ramon DredgeEdward, [employer]-C

## 2016-08-25 NOTE — Progress Notes (Signed)
Pre visit review using our clinic tool,if applicable. No additional management support is needed unless otherwise documented below in the visit note.  

## 2016-08-25 NOTE — Patient Instructions (Signed)
Schedule CT WITHOUT CONTRAST

## 2016-08-31 ENCOUNTER — Ambulatory Visit (HOSPITAL_BASED_OUTPATIENT_CLINIC_OR_DEPARTMENT_OTHER)
Admission: RE | Admit: 2016-08-31 | Discharge: 2016-08-31 | Disposition: A | Discharge status: Home / Self Care | Payer: Medicare Other | Source: Ambulatory Visit | Attending: Pulmonary Disease | Admitting: Pulmonary Disease

## 2016-08-31 DIAGNOSIS — I700 Atherosclerosis of aorta: Secondary | ICD-10-CM | POA: Insufficient documentation

## 2016-08-31 DIAGNOSIS — I2510 Atherosclerotic heart disease of native coronary artery without angina pectoris: Secondary | ICD-10-CM | POA: Insufficient documentation

## 2016-08-31 DIAGNOSIS — R911 Solitary pulmonary nodule: Secondary | ICD-10-CM | POA: Diagnosis not present

## 2016-08-31 DIAGNOSIS — J479 Bronchiectasis, uncomplicated: Secondary | ICD-10-CM | POA: Diagnosis not present

## 2016-08-31 DIAGNOSIS — R918 Other nonspecific abnormal finding of lung field: Secondary | ICD-10-CM | POA: Diagnosis not present

## 2016-09-02 DIAGNOSIS — L700 Acne vulgaris: Secondary | ICD-10-CM | POA: Diagnosis not present

## 2016-10-20 DIAGNOSIS — L700 Acne vulgaris: Secondary | ICD-10-CM | POA: Diagnosis not present

## 2016-11-06 ENCOUNTER — Other Ambulatory Visit: Payer: Self-pay | Admitting: Medical

## 2016-11-11 ENCOUNTER — Other Ambulatory Visit: Payer: Self-pay | Admitting: *Deleted

## 2016-11-11 DIAGNOSIS — F411 Generalized anxiety disorder: Secondary | ICD-10-CM

## 2016-11-11 NOTE — Telephone Encounter (Signed)
[facility] sent a request for clonazepam ODT tabs 1mg .  Can you send in a 90 day supply for patient.    Last printed rx 06/02/16 Last OV for anxiety: 06/02/16 at CPE Next OV due in January Needs to sign a control substance contract and needs a UDS

## 2016-11-12 NOTE — Telephone Encounter (Signed)
I only rx 30 days at a time. Will defer to pcp upon her return.

## 2016-11-15 NOTE — Telephone Encounter (Signed)
Ok to do 90 with no refills

## 2016-11-16 NOTE — Telephone Encounter (Signed)
Spoke with Rep from [facility] to call in refill for pt, but Rep stated she won't be able to fill until they receive a new Rx from the office. Rep stated they faxed a new Rx to be signed and faxed back.  LB

## 2016-11-23 MED ORDER — CLONAZEPAM 1 MG PO TABS: 1.0000 mg | ORAL_TABLET | Freq: Every day | ORAL | 0 refills | Status: DC | PRN

## 2016-12-01 DIAGNOSIS — L03317 Cellulitis of buttock: Secondary | ICD-10-CM | POA: Diagnosis not present

## 2016-12-01 DIAGNOSIS — L700 Acne vulgaris: Secondary | ICD-10-CM | POA: Diagnosis not present

## 2016-12-05 ENCOUNTER — Other Ambulatory Visit: Payer: Self-pay

## 2016-12-05 DIAGNOSIS — F411 Generalized anxiety disorder: Secondary | ICD-10-CM

## 2016-12-05 MED ORDER — CLONAZEPAM 1 MG PO TABS: 1.0000 mg | ORAL_TABLET | Freq: Every day | ORAL | 0 refills | Status: DC | PRN

## 2016-12-05 NOTE — Telephone Encounter (Signed)
Hard copy of Rx faxed to [facility]: [id number] [phone] (clonazepam 1mg ).

## 2016-12-13 DIAGNOSIS — Z0189 Encounter for other specified special examinations: Secondary | ICD-10-CM | POA: Diagnosis not present

## 2016-12-13 DIAGNOSIS — L02436 Carbuncle of left lower limb: Secondary | ICD-10-CM | POA: Diagnosis not present

## 2016-12-20 DIAGNOSIS — L02436 Carbuncle of left lower limb: Secondary | ICD-10-CM | POA: Diagnosis not present

## 2016-12-20 DIAGNOSIS — Z0189 Encounter for other specified special examinations: Secondary | ICD-10-CM | POA: Diagnosis not present

## 2016-12-30 ENCOUNTER — Other Ambulatory Visit: Payer: Self-pay | Admitting: Family Medicine

## 2016-12-30 NOTE — Telephone Encounter (Signed)
Caller name: Relationship to patient: Self Can be reached: Pharmacy:  [facility] [place] - [address] [phone] (Phone) [phone] (Fax)     Reason for call: Request refill on valACYclovir (VALTREX) 500 MG tablet (to [facility])   NOTE: Patient request that a Rx be sent also to her local pharmacy [facility]( [facility])  to last her until she receives her mail order. States she only has 4 days left of medication.

## 2017-01-03 MED ORDER — VALACYCLOVIR HCL 500 MG PO TABS: 500.0000 mg | ORAL_TABLET | Freq: Every day | ORAL | 1 refills | Status: DC

## 2017-01-03 MED ORDER — VALACYCLOVIR HCL 500 MG PO TABS: 500.0000 mg | ORAL_TABLET | Freq: Every day | ORAL | 0 refills | Status: DC

## 2017-01-03 NOTE — Telephone Encounter (Signed)
Patient notified and rxs sent in  

## 2017-01-24 DIAGNOSIS — L089 Local infection of the skin and subcutaneous tissue, unspecified: Secondary | ICD-10-CM | POA: Diagnosis not present

## 2017-01-31 ENCOUNTER — Ambulatory Visit (INDEPENDENT_AMBULATORY_CARE_PROVIDER_SITE_OTHER): Payer: Medicare Other | Admitting: Family Medicine

## 2017-01-31 ENCOUNTER — Encounter: Payer: Self-pay | Admitting: Family Medicine

## 2017-01-31 VITALS — BP 118/78 | HR 80 | Temp 97.8°F | Resp 16 | Ht 60.0 in | Wt 132.4 lb

## 2017-01-31 DIAGNOSIS — I10 Essential (primary) hypertension: Secondary | ICD-10-CM

## 2017-01-31 DIAGNOSIS — E785 Hyperlipidemia, unspecified: Secondary | ICD-10-CM | POA: Diagnosis not present

## 2017-01-31 DIAGNOSIS — F411 Generalized anxiety disorder: Secondary | ICD-10-CM | POA: Diagnosis not present

## 2017-01-31 DIAGNOSIS — Z1159 Encounter for screening for other viral diseases: Secondary | ICD-10-CM

## 2017-01-31 MED ORDER — ZOSTER VAC RECOMB ADJUVANTED 50 MCG/0.5ML IM SUSR: 50.0000 ug | Freq: Once | INTRAMUSCULAR | 1 refills | Status: AC

## 2017-01-31 MED ORDER — VALACYCLOVIR HCL 500 MG PO TABS: 500.0000 mg | ORAL_TABLET | Freq: Every day | ORAL | 1 refills | Status: DC

## 2017-01-31 NOTE — Assessment & Plan Note (Signed)
Well controlled, no changes to meds. Encouraged heart healthy diet such as the DASH diet and exercise as tolerated.  °

## 2017-01-31 NOTE — Patient Instructions (Signed)
Generalized Anxiety Disorder, Adult Generalized anxiety disorder (GAD) is a mental health disorder. People with this condition constantly worry about everyday events. Unlike normal anxiety, worry related to GAD is not triggered by a specific event. These worries also do not fade or get better with time. GAD interferes with life functions, including relationships, work, and school. GAD can vary from mild to severe. People with severe GAD can have intense waves of anxiety with physical symptoms (panic attacks). What are the causes? The exact cause of GAD is not known. What increases the risk? This condition is more likely to develop in:  Women.  People who have a family history of anxiety disorders.  People who are very shy.  People who experience very stressful life events, such as the death of a loved one.  People who have a very stressful family environment. What are the signs or symptoms? People with GAD often worry excessively about many things in their lives, such as their health and family. They may also be overly concerned about:  Doing well at work.  Being on time.  Natural disasters.  Friendships. Physical symptoms of GAD include:  Fatigue.  Muscle tension or having muscle twitches.  Trembling or feeling shaky.  Being easily startled.  Feeling like your heart is pounding or racing.  Feeling out of breath or like you cannot take a deep breath.  Having trouble falling asleep or staying asleep.  Sweating.  Nausea, diarrhea, or irritable bowel syndrome (IBS).  Headaches.  Trouble concentrating or remembering facts.  Restlessness.  Irritability. How is this diagnosed? Your health care provider can diagnose GAD based on your symptoms and medical history. You will also have a physical exam. The health care provider will ask specific questions about your symptoms, including how severe they are, when they started, and if they come and go. Your health care  provider may ask you about your use of alcohol or drugs, including prescription medicines. Your health care provider may refer you to a mental health specialist for further evaluation. Your health care provider will do a thorough examination and may perform additional tests to rule out other possible causes of your symptoms. To be diagnosed with GAD, a person must have anxiety that:  Is out of his or her control.  Affects several different aspects of his or her life, such as work and relationships.  Causes distress that makes him or her unable to take part in normal activities.  Includes at least three physical symptoms of GAD, such as restlessness, fatigue, trouble concentrating, irritability, muscle tension, or sleep problems. Before your health care provider can confirm a diagnosis of GAD, these symptoms must be present more days than they are not, and they must last for six months or longer. How is this treated? The following therapies are usually used to treat GAD:  Medicine. Antidepressant medicine is usually prescribed for long-term daily control. Antianxiety medicines may be added in severe cases, especially when panic attacks occur.  Talk therapy (psychotherapy). Certain types of talk therapy can be helpful in treating GAD by providing support, education, and guidance. Options include:  Cognitive behavioral therapy (CBT). People learn coping skills and techniques to ease their anxiety. They learn to identify unrealistic or negative thoughts and behaviors and to replace them with positive ones.  Acceptance and commitment therapy (ACT). This treatment teaches people how to be mindful as a way to cope with unwanted thoughts and feelings.  Biofeedback. This process trains you to manage your body's response (  physiological response) through breathing techniques and relaxation methods. You will work with a therapist while machines are used to monitor your physical symptoms.  Stress  management techniques. These include yoga, meditation, and exercise. A mental health specialist can help determine which treatment is best for you. Some people see improvement with one type of therapy. However, other people require a combination of therapies. Follow these instructions at home:  Take over-the-counter and prescription medicines only as told by your health care provider.  Try to maintain a normal routine.  Try to anticipate stressful situations and allow extra time to manage them.  Practice any stress management or self-calming techniques as taught by your health care provider.  Do not punish yourself for setbacks or for not making progress.  Try to recognize your accomplishments, even if they are small.  Keep all follow-up visits as told by your health care provider. This is important. Contact a health care provider if:  Your symptoms do not get better.  Your symptoms get worse.  You have signs of depression, such as:  A persistently sad, cranky, or irritable mood.  Loss of enjoyment in activities that used to bring you joy.  Change in weight or eating.  Changes in sleeping habits.  Avoiding friends or family members.  Loss of energy for normal tasks.  Feelings of guilt or worthlessness. Get help right away if:  You have serious thoughts about hurting yourself or others. If you ever feel like you may hurt yourself or others, or have thoughts about taking your own life, get help right away. You can go to your nearest emergency department or call:  Your local emergency services (911 in the [place]  A suicide crisis helpline, such as the National Suicide Prevention Lifeline at 1-800-273-8255. This is open 24 hours a day. Summary  Generalized anxiety disorder (GAD) is a mental health disorder that involves worry that is not triggered by a specific event.  People with GAD often worry excessively about many things in their lives, such as their health and  family.  GAD may cause physical symptoms such as restlessness, trouble concentrating, sleep problems, frequent sweating, nausea, diarrhea, headaches, and trembling or muscle twitching.  A mental health specialist can help determine which treatment is best for you. Some people see improvement with one type of therapy. However, other people require a combination of therapies. This information is not intended to replace advice given to you by your health care provider. Make sure you discuss any questions you have with your health care provider. Document Released: 03/04/2013 Document Revised: 09/27/2016 Document Reviewed: 09/27/2016 Elsevier Interactive Patient Education  2017 [employer]  

## 2017-01-31 NOTE — Assessment & Plan Note (Signed)
con't wellbutrin- pt aware it may make her more anxious but states she was put on it for anxiety con't klonopin for now If she still needs it regularly after everything calms down we may need to add lexapro or something similar

## 2017-01-31 NOTE — Progress Notes (Signed)
Pre visit review using our clinic review tool, if applicable. No additional management support is needed unless otherwise documented below in the visit note. 

## 2017-01-31 NOTE — Progress Notes (Signed)
Subjective:  I acted as a Neurosurgeon for Dr. Delman Kitten, LPN    Patient ID: Erin Acosta, female    DOB: [birth year], 66 y.o.   MRN: [id number]  Chief Complaint  Patient presents with  . Hypertension    follow up  . Anxiety    follow up    Hypertension  This is a chronic problem. The current episode started more than 1 year ago. The problem is controlled. Associated symptoms include anxiety. Pertinent negatives include no blurred vision, chest pain, headaches or palpitations.  Anxiety  Presents for follow-up visit. Patient reports no chest pain or palpitations.      Patient is in today for follow up hypertension, and anxiety. Patient have concerns of taking medication for anxiety on a daily basis instead of as needed. Patient report she had an liposuction and fat transfer, fat transfer from belly to buttock and received an affection on belly and buttock. Patient report she will affection disease tomorrow. Patient also report she had an panic attack as soon as her daughter moved away.    Patient Care Team: [name], DO as PCP - General (Family Medicine) Malva Cogan, MD as Referring Physician (Dermatology) Blondell Reveal, MD (Obstetrics and Gynecology) Merleen Milliner, MD as Referring Physician (Cardiology) Jodi Geralds, MD as Consulting Physician (Plastic Surgery)   Past Medical History:  Diagnosis Date  . Allergy   . Anxiety   . Depression   . Genital herpes   . H/O cesarean section   . Hypertension   . Rotator cuff tear   . Scoliosis     Past Surgical History:  Procedure Laterality Date  . BILATERAL CARPAL TUNNEL RELEASE    . BLEPHAROPLASTY    . CESAREAN SECTION    . FOOT SURGERY Right   . mini face lift    . TRIGGER FINGER RELEASE    . tummy tuck      Family History  Problem Relation Age of Onset  . Arthritis    . Hyperlipidemia    . Heart disease    . Stroke    . Hypertension      Social History   Social History  .  Marital status: Widowed    Spouse name: N/A  . Number of children: N/A  . Years of education: N/A   Occupational History  . Not on file.   Social History Main Topics  . Smoking status: Never Smoker  . Smokeless tobacco: Never Used  . Alcohol use 0.6 oz/week    1 Standard drinks or equivalent per week     Comment: Wine in the evening  . Drug use: No  . Sexual activity: Not on file   Other Topics Concern  . Not on file   Social History Narrative  . No narrative on file    Outpatient Medications Prior to Visit  Medication Sig Dispense Refill  . aspirin EC 81 MG tablet Take 81 mg by mouth daily.    Marland Kitchen buPROPion (WELLBUTRIN XL) 300 MG 24 hr tablet Take 300 mg by mouth daily.    . clonazePAM (KLONOPIN) 1 MG tablet Take 1 tablet (1 mg total) by mouth daily as needed. 90 tablet 0  . fluticasone (FLONASE) 50 MCG/ACT nasal spray Place 2 sprays into both nostrils daily. 16 g 1  . lisinopril (PRINIVIL,ZESTRIL) 5 MG tablet Take 1 tablet (5 mg total) by mouth daily.    . montelukast (SINGULAIR) 10 MG tablet Take 1  tablet (10 mg total) by mouth daily.    . Multiple Vitamin (MULTIVITAMIN) capsule Take 1 capsule by mouth daily.    . Probiotic Product (PROBIOTIC DAILY PO) Take by mouth.    Marland Kitchen. HYDROcodone-acetaminophen (NORCO/VICODIN) 5-325 MG tablet Take 1 tablet by mouth every 6 (six) hours as needed for moderate pain. 30 tablet 0  . ISOtretinoin (ACCUTANE) 40 MG capsule Take 40 mg by mouth 2 (two) times daily.    Marland Kitchen. loratadine (CLARITIN) 10 MG tablet TAKE 1 TABLET DAILY 90 tablet 0  . predniSONE (DELTASONE) 10 MG tablet 3 tab po day 1, 2 tab po day 2, 1 tab po day 3 6 tablet 0  . valACYclovir (VALTREX) 500 MG tablet Take 1 tablet (500 mg total) by mouth daily. 90 tablet 1   No facility-administered medications prior to visit.     No Known Allergies  Review of Systems  Constitutional: Negative for fever.  HENT: Negative for congestion.   Eyes: Negative for blurred vision.  Respiratory:  Negative for cough.   Cardiovascular: Negative for chest pain and palpitations.  Gastrointestinal: Negative for vomiting.  Musculoskeletal: Negative for back pain.  Skin: Negative for rash.  Neurological: Negative for loss of consciousness and headaches.       Objective:    Physical Exam  Constitutional: She is oriented to person, place, and time. She appears well-developed and well-nourished. No distress.  HENT:  Head: Normocephalic and atraumatic.  Eyes: Conjunctivae are normal. Pupils are equal, round, and reactive to light.  Neck: Normal range of motion. No thyromegaly present.  Cardiovascular: Normal rate and regular rhythm.   Pulmonary/Chest: Effort normal and breath sounds normal. She has no wheezes.  Abdominal: Soft. Bowel sounds are normal. There is no tenderness.  Musculoskeletal: Normal range of motion. She exhibits no edema or deformity.  Neurological: She is alert and oriented to person, place, and time.  Skin: Skin is warm and dry. She is not diaphoretic.  Psychiatric: She has a normal mood and affect.    BP 118/78 (BP Location: Left Arm, Patient Position: Sitting, Cuff Size: Normal)   Pulse 80   Temp 97.8 F (36.6 C) (Oral)   Resp 16   Ht 5' (1.524 m)   Wt 132 lb 6.4 oz (60.1 kg)   SpO2 98%   BMI 25.86 kg/m  Wt Readings from Last 3 Encounters:  01/31/17 132 lb 6.4 oz (60.1 kg)  08/25/16 143 lb (64.9 kg)  08/25/16 143 lb (64.9 kg)     Lab Results  Component Value Date   WBC 9.1 06/06/2016   HGB 14.8 06/06/2016   HCT 42.7 06/06/2016   PLT 279.0 06/06/2016   GLUCOSE 95 06/06/2016   CHOL 229 (H) 06/06/2016   TRIG 255.0 (H) 06/06/2016   HDL 44.50 06/06/2016   LDLDIRECT 153.0 06/06/2016   ALT 20 06/06/2016   AST 18 06/06/2016   NA 138 06/06/2016   K 3.9 06/06/2016   CL 104 06/06/2016   CREATININE 0.67 06/06/2016   BUN 15 06/06/2016   CO2 27 06/06/2016    No results found for: TSH Lab Results  Component Value Date   WBC 9.1 06/06/2016   HGB  14.8 06/06/2016   HCT 42.7 06/06/2016   MCV 89.7 06/06/2016   PLT 279.0 06/06/2016   Lab Results  Component Value Date   NA 138 06/06/2016   K 3.9 06/06/2016   CO2 27 06/06/2016   GLUCOSE 95 06/06/2016   BUN 15 06/06/2016   CREATININE 0.67  06/06/2016   BILITOT 0.6 06/06/2016   ALKPHOS 73 06/06/2016   AST 18 06/06/2016   ALT 20 06/06/2016   PROT 7.1 06/06/2016   ALBUMIN 4.2 06/06/2016   CALCIUM 9.3 06/06/2016   GFR 93.80 06/06/2016   Lab Results  Component Value Date   CHOL 229 (H) 06/06/2016   Lab Results  Component Value Date   HDL 44.50 06/06/2016   No results found for: [facility] Lab Results  Component Value Date   TRIG 255.0 (H) 06/06/2016   Lab Results  Component Value Date   CHOLHDL 5 06/06/2016   No results found for: HGBA1C     Assessment & Plan:   Problem List Items Addressed This Visit      Unprioritized   Essential (primary) hypertension - Primary    Well controlled, no changes to meds. Encouraged heart healthy diet such as the DASH diet and exercise as tolerated.        Relevant Orders   Comprehensive metabolic panel   CBC   Generalized anxiety disorder    con't wellbutrin- pt aware it may make her more anxious but states she was put on it for anxiety con't klonopin for now If she still needs it regularly after everything calms down we may need to add lexapro or something similar       Other Visit Diagnoses    Need for hepatitis C screening test       Relevant Orders   Hepatitis C antibody   Hyperlipidemia LDL goal <100       Relevant Orders   Lipid panel      I have discontinued Ms. Koslosky's HYDROcodone-acetaminophen, ISOtretinoin, predniSONE, and loratadine. I am also having her start on Zoster Vac Recomb Adjuvanted. Additionally, I am having her maintain her buPROPion, aspirin EC, multivitamin, lisinopril, montelukast, Probiotic Product (PROBIOTIC DAILY PO), fluticasone, clonazePAM, and valACYclovir.  Meds ordered this encounter    Medications  . valACYclovir (VALTREX) 500 MG tablet    Sig: Take 1 tablet (500 mg total) by mouth daily.    Dispense:  90 tablet    Refill:  1  . Zoster Vac Recomb Adjuvanted (SHINGRIX) 50 MCG SUSR    Sig: Inject 50 mcg into the muscle once.    Dispense:  1 each    Refill:  1    CMA served as scribe during this visit. History, Physical and Plan performed by medical provider. Documentation and orders reviewed and attested to.  [name], DO   Patient ID: Erin Acosta, female   DOB: 1951/05/05, 66 y.o.   MRN: [id number]

## 2017-02-01 DIAGNOSIS — L0231 Cutaneous abscess of buttock: Secondary | ICD-10-CM | POA: Diagnosis not present

## 2017-02-01 DIAGNOSIS — A499 Bacterial infection, unspecified: Secondary | ICD-10-CM | POA: Diagnosis not present

## 2017-02-01 DIAGNOSIS — T814XXD Infection following a procedure, subsequent encounter: Secondary | ICD-10-CM | POA: Diagnosis not present

## 2017-02-02 ENCOUNTER — Other Ambulatory Visit: Payer: Medicare Other

## 2017-02-06 ENCOUNTER — Other Ambulatory Visit: Payer: Self-pay | Admitting: Medical

## 2017-02-07 DIAGNOSIS — M47816 Spondylosis without myelopathy or radiculopathy, lumbar region: Secondary | ICD-10-CM | POA: Diagnosis not present

## 2017-02-09 ENCOUNTER — Other Ambulatory Visit (INDEPENDENT_AMBULATORY_CARE_PROVIDER_SITE_OTHER): Payer: Medicare Other

## 2017-02-09 DIAGNOSIS — E785 Hyperlipidemia, unspecified: Secondary | ICD-10-CM | POA: Diagnosis not present

## 2017-02-09 DIAGNOSIS — I10 Essential (primary) hypertension: Secondary | ICD-10-CM | POA: Diagnosis not present

## 2017-02-09 LAB — CBC
HEMATOCRIT: 42.4 % (ref 36.0–46.0)
HEMOGLOBIN: 14.4 g/dL (ref 12.0–15.0)
MCHC: 33.9 g/dL (ref 30.0–36.0)
MCV: 87.7 fl (ref 78.0–100.0)
Platelets: 289 10*3/uL (ref 150.0–400.0)
RBC: 4.83 Mil/uL (ref 3.87–5.11)
RDW: 15.6 % — AB (ref 11.5–15.5)
WBC: 8.7 10*3/uL (ref 4.0–10.5)

## 2017-02-10 LAB — COMPREHENSIVE METABOLIC PANEL
ALT: 18 U/L (ref 0–35)
AST: 16 U/L (ref 0–37)
Albumin: 3.9 g/dL (ref 3.5–5.2)
Alkaline Phosphatase: 69 U/L (ref 39–117)
BUN: 13 mg/dL (ref 6–23)
CHLORIDE: 106 meq/L (ref 96–112)
CO2: 28 meq/L (ref 19–32)
CREATININE: 0.67 mg/dL (ref 0.40–1.20)
Calcium: 9.5 mg/dL (ref 8.4–10.5)
GFR: 93.6 mL/min (ref >60.00)
Glucose, Bld: 94 mg/dL (ref 70–99)
POTASSIUM: 3.7 meq/L (ref 3.5–5.1)
SODIUM: 140 meq/L (ref 135–145)
Total Bilirubin: 0.4 mg/dL (ref 0.2–1.2)
Total Protein: 6.5 g/dL (ref 6.0–8.3)

## 2017-02-10 LAB — LIPID PANEL
CHOL/HDL RATIO: 4
Cholesterol: 210 mg/dL — ABNORMAL HIGH (ref 0–200)
HDL: 52.9 mg/dL (ref >39.00)
LDL CALC: 127 mg/dL — AB (ref 0–99)
NONHDL: 156.75
Triglycerides: 148 mg/dL (ref 0.0–149.0)
VLDL: 29.6 mg/dL (ref 0.0–40.0)

## 2017-02-13 ENCOUNTER — Other Ambulatory Visit: Payer: Self-pay | Admitting: Family Medicine

## 2017-02-13 DIAGNOSIS — E785 Hyperlipidemia, unspecified: Secondary | ICD-10-CM

## 2017-02-14 DIAGNOSIS — L0231 Cutaneous abscess of buttock: Secondary | ICD-10-CM | POA: Diagnosis not present

## 2017-02-15 DIAGNOSIS — R935 Abnormal findings on diagnostic imaging of other abdominal regions, including retroperitoneum: Secondary | ICD-10-CM | POA: Diagnosis not present

## 2017-02-15 DIAGNOSIS — T814XXD Infection following a procedure, subsequent encounter: Secondary | ICD-10-CM | POA: Diagnosis not present

## 2017-03-17 ENCOUNTER — Telehealth: Payer: Self-pay | Admitting: Family Medicine

## 2017-03-17 DIAGNOSIS — F411 Generalized anxiety disorder: Secondary | ICD-10-CM

## 2017-03-17 NOTE — Telephone Encounter (Signed)
Caller name: Relationship to patient: Self Can be reached: [phone] Pharmacy: [facility] - [place], [place] - [id number]  Reason for call: Refill clonazePAM Scarlette Calico) 1 MG tablet  Patient request a 10 day supply sent to [facility] [address]

## 2017-03-20 MED ORDER — CLONAZEPAM 1 MG PO TABS: 1.0000 mg | ORAL_TABLET | Freq: Every day | ORAL | 0 refills | Status: DC | PRN

## 2017-03-20 NOTE — Addendum Note (Signed)
Addended by: Scharlene Gloss B on: 03/20/2017 03:54 PM   Modules accepted: Orders

## 2017-03-20 NOTE — Telephone Encounter (Signed)
Requesting:   Lorazepam Contract    none UDS   None Last OV    01/31/2017 Last Refill     #90 no refills on 12/05/2016  Please Advise

## 2017-03-20 NOTE — Telephone Encounter (Signed)
[facility]ar[facility] with [facility]

## 2017-03-20 NOTE — Telephone Encounter (Signed)
Refill x1 

## 2017-03-28 DIAGNOSIS — L089 Local infection of the skin and subcutaneous tissue, unspecified: Secondary | ICD-10-CM | POA: Diagnosis not present

## 2017-04-14 DIAGNOSIS — R079 Chest pain, unspecified: Secondary | ICD-10-CM | POA: Diagnosis not present

## 2017-04-14 DIAGNOSIS — E789 Disorder of lipoprotein metabolism, unspecified: Secondary | ICD-10-CM | POA: Diagnosis not present

## 2017-05-08 ENCOUNTER — Encounter: Payer: Self-pay | Admitting: Family Medicine

## 2017-05-08 ENCOUNTER — Ambulatory Visit (INDEPENDENT_AMBULATORY_CARE_PROVIDER_SITE_OTHER): Payer: Medicare Other | Admitting: Family Medicine

## 2017-05-08 VITALS — BP 126/80 | HR 82 | Temp 98.2°F | Resp 16 | Ht 60.0 in | Wt 136.8 lb

## 2017-05-08 DIAGNOSIS — F411 Generalized anxiety disorder: Secondary | ICD-10-CM | POA: Diagnosis not present

## 2017-05-08 DIAGNOSIS — I10 Essential (primary) hypertension: Secondary | ICD-10-CM

## 2017-05-08 DIAGNOSIS — R911 Solitary pulmonary nodule: Secondary | ICD-10-CM

## 2017-05-08 DIAGNOSIS — E785 Hyperlipidemia, unspecified: Secondary | ICD-10-CM | POA: Insufficient documentation

## 2017-05-08 DIAGNOSIS — Z1159 Encounter for screening for other viral diseases: Secondary | ICD-10-CM | POA: Diagnosis not present

## 2017-05-08 MED ORDER — ESCITALOPRAM OXALATE 10 MG PO TABS: 10.0000 mg | ORAL_TABLET | Freq: Every day | ORAL | 0 refills | Status: DC

## 2017-05-08 MED ORDER — BUPROPION HCL ER (XL) 150 MG PO TB24: 150.0000 mg | ORAL_TABLET | Freq: Every day | ORAL | 0 refills | Status: DC

## 2017-05-08 NOTE — Assessment & Plan Note (Signed)
Encouraged heart healthy diet, increase exercise, avoid trans fats, consider a krill oil cap daily 

## 2017-05-08 NOTE — Assessment & Plan Note (Signed)
Well controlled, no changes to meds. Encouraged heart healthy diet such as the DASH diet and exercise as tolerated.  °

## 2017-05-08 NOTE — Progress Notes (Signed)
Patient ID: Erin Acosta, female   DOB: February 15, 1951, 66 y.o.   MRN: [id number]     Subjective:  I acted as a Neurosurgeonscribe for Dr. Zola ButtonLowne-Chase.  Apolonio SchneidersSheketia, CMA   Patient ID: Erin CornCynthia R Acosta, female    DOB: February 15, 1951, 66 y.o.   MRN: [id number]  Chief Complaint  Patient presents with  . Hypertension  . Medication Problem    HPI  Patient is in today for follow up blood pressure and to talk about Wellbutrin and clonazepam.  She sees her cardiologist Dr. Beverely Paceheek and told her that she does not need to be on both Wellbutrin and clonazepam.  Per Dr. Beverely Paceheek her blood pressure has been doing well.  Patient Care Team: Zola ButtonLowne Chase, Grayling CongressYvonne R, DO as PCP - General (Family Medicine) Malva CoganButler, Joshua S, MD as Referring Physician (Dermatology) Blondell RevealFletcher, Richard V, MD (Obstetrics and Gynecology) Beverely Paceheek, Osborn CohoHerman Barrett, MD as Referring Physician (Cardiology) Jodi GeraldsWillard, Virgil V, MD as Consulting Physician (Plastic Surgery)   Past Medical History:  Diagnosis Date  . Allergy   . Anxiety   . Depression   . Genital herpes   . H/O cesarean section   . Hypertension   . Rotator cuff tear   . Scoliosis     Past Surgical History:  Procedure Laterality Date  . BILATERAL CARPAL TUNNEL RELEASE    . BLEPHAROPLASTY    . CESAREAN SECTION    . FOOT SURGERY Right   . mini face lift    . TRIGGER FINGER RELEASE    . tummy tuck      Family History  Problem Relation Age of Onset  . Arthritis Unknown   . Hyperlipidemia Unknown   . Heart disease Unknown   . Stroke Unknown   . Hypertension Unknown     Social History   Social History  . Marital status: Widowed    Spouse name: N/A  . Number of children: N/A  . Years of education: N/A   Occupational History  . Not on file.   Social History Main Topics  . Smoking status: Never Smoker  . Smokeless tobacco: Never Used  . Alcohol use 0.6 oz/week    1 Standard drinks or equivalent per week     Comment: Wine in the evening  . Drug use: No  . Sexual activity:  Not on file   Other Topics Concern  . Not on file   Social History Narrative  . No narrative on file    Outpatient Medications Prior to Visit  Medication Sig Dispense Refill  . aspirin EC 81 MG tablet Take 81 mg by mouth daily.    . clonazePAM (KLONOPIN) 1 MG tablet Take 1 tablet (1 mg total) by mouth daily as needed. 90 tablet 0  . fluticasone (FLONASE) 50 MCG/ACT nasal spray Place 2 sprays into both nostrils daily. 16 g 1  . lisinopril (PRINIVIL,ZESTRIL) 5 MG tablet Take 1 tablet (5 mg total) by mouth daily.    . montelukast (SINGULAIR) 10 MG tablet Take 1 tablet (10 mg total) by mouth daily.    . Multiple Vitamin (MULTIVITAMIN) capsule Take 1 capsule by mouth daily.    . Probiotic Product (PROBIOTIC DAILY PO) Take by mouth.    . valACYclovir (VALTREX) 500 MG tablet Take 1 tablet (500 mg total) by mouth daily. 90 tablet 1  . buPROPion (WELLBUTRIN XL) 300 MG 24 hr tablet Take 300 mg by mouth daily.    Marland Kitchen. loratadine (CLARITIN) 10 MG tablet TAKE 1 TABLET DAILY 90  tablet 0   No facility-administered medications prior to visit.     No Known Allergies  Review of Systems  Constitutional: Negative for fever and malaise/fatigue.  HENT: Negative for congestion.   Eyes: Negative for blurred vision.  Respiratory: Negative for cough and shortness of breath.   Cardiovascular: Negative for chest pain, palpitations and leg swelling.  Gastrointestinal: Negative for vomiting.  Musculoskeletal: Negative for back pain.  Skin: Negative for rash.  Neurological: Negative for loss of consciousness and headaches.       Objective:    Physical Exam  Constitutional: She is oriented to person, place, and time. She appears well-developed and well-nourished. No distress.  HENT:  Head: Normocephalic and atraumatic.  Eyes: Conjunctivae and EOM are normal.  Neck: Normal range of motion. Neck supple. No JVD present. Carotid bruit is not present. No thyromegaly present.  Cardiovascular: Normal rate,  regular rhythm and normal heart sounds.   No murmur heard. Pulmonary/Chest: Effort normal and breath sounds normal. No respiratory distress. She has no wheezes. She has no rales. She exhibits no tenderness.  Abdominal: Soft. Bowel sounds are normal. There is no tenderness.  Musculoskeletal: Normal range of motion. She exhibits no edema or deformity.  Neurological: She is alert and oriented to person, place, and time.  Skin: Skin is warm and dry. She is not diaphoretic.  Psychiatric: She has a normal mood and affect. Her behavior is normal. Judgment and thought content normal.  Nursing note and vitals reviewed.   BP 126/80 (BP Location: Left Arm, Cuff Size: Normal)   Pulse 82   Temp 98.2 F (36.8 C) (Oral)   Resp 16   Ht 5' (1.524 m)   Wt 136 lb 12.8 oz (62.1 kg)   SpO2 98%   BMI 26.72 kg/m  Wt Readings from Last 3 Encounters:  05/08/17 136 lb 12.8 oz (62.1 kg)  01/31/17 132 lb 6.4 oz (60.1 kg)  08/25/16 143 lb (64.9 kg)   BP Readings from Last 3 Encounters:  05/08/17 126/80  01/31/17 118/78  08/25/16 132/85     Immunization History  Administered Date(s) Administered  . Influenza-Unspecified 08/05/2016  . Pneumococcal Conjugate-13 06/02/2016  . Zoster 11/21/2013  . Zoster Recombinat (Shingrix) 02/16/2017    Health Maintenance  Topic Date Due  . Hepatitis C Screening  Apr 17, 1951  . TETANUS/TDAP  02/12/1970  . COLONOSCOPY  02/12/2001  . DEXA SCAN  02/13/2016  . PNA vac Low Risk Adult (2 of 2 - PPSV23) 06/02/2017  . INFLUENZA VACCINE  06/21/2017  . MAMMOGRAM  05/26/2018    Lab Results  Component Value Date   WBC 8.7 02/09/2017   HGB 14.4 02/09/2017   HCT 42.4 02/09/2017   PLT 289.0 02/09/2017   GLUCOSE 94 02/09/2017   CHOL 210 (H) 02/09/2017   TRIG 148.0 02/09/2017   HDL 52.90 02/09/2017   LDLDIRECT 153.0 06/06/2016   LDLCALC 127 (H) 02/09/2017   ALT 18 02/09/2017   AST 16 02/09/2017   NA 140 02/09/2017   K 3.7 02/09/2017   CL 106 02/09/2017   CREATININE  0.67 02/09/2017   BUN 13 02/09/2017   CO2 28 02/09/2017    No results found for: TSH Lab Results  Component Value Date   WBC 8.7 02/09/2017   HGB 14.4 02/09/2017   HCT 42.4 02/09/2017   MCV 87.7 02/09/2017   PLT 289.0 02/09/2017   Lab Results  Component Value Date   NA 140 02/09/2017   K 3.7 02/09/2017   CO2 28  02/09/2017   GLUCOSE 94 02/09/2017   BUN 13 02/09/2017   CREATININE 0.67 02/09/2017   BILITOT 0.4 02/09/2017   ALKPHOS 69 02/09/2017   AST 16 02/09/2017   ALT 18 02/09/2017   PROT 6.5 02/09/2017   ALBUMIN 3.9 02/09/2017   CALCIUM 9.5 02/09/2017   GFR 93.60 02/09/2017   Lab Results  Component Value Date   CHOL 210 (H) 02/09/2017   Lab Results  Component Value Date   HDL 52.90 02/09/2017   Lab Results  Component Value Date   LDLCALC 127 (H) 02/09/2017   Lab Results  Component Value Date   TRIG 148.0 02/09/2017   Lab Results  Component Value Date   CHOLHDL 4 02/09/2017   No results found for: HGBA1C       Assessment & Plan:   Problem List Items Addressed This Visit      Unprioritized   Generalized anxiety disorder - Primary   Relevant Medications   buPROPion (WELLBUTRIN XL) 150 MG 24 hr tablet   escitalopram (LEXAPRO) 10 MG tablet   Essential hypertension    Well controlled, no changes to meds. Encouraged heart healthy diet such as the DASH diet and exercise as tolerated.       Relevant Orders   Lipid panel   Comprehensive metabolic panel   Hyperlipidemia    Encouraged heart healthy diet, increase exercise, avoid trans fats, consider a krill oil cap daily      Relevant Orders   Lipid panel   Comprehensive metabolic panel    Other Visit Diagnoses    Pulmonary nodule       Relevant Orders   Ambulatory referral to Pulmonology   Need for hepatitis C screening test       Relevant Orders   Hepatitis C antibody      I have discontinued Ms. Blakeman's buPROPion and loratadine. I am also having her start on buPROPion and escitalopram.  Additionally, I am having her maintain her aspirin EC, multivitamin, lisinopril, montelukast, Probiotic Product (PROBIOTIC DAILY PO), fluticasone, valACYclovir, and clonazePAM.  Meds ordered this encounter  Medications  . buPROPion (WELLBUTRIN XL) 150 MG 24 hr tablet    Sig: Take 1 tablet (150 mg total) by mouth daily.    Dispense:  30 tablet    Refill:  0  . escitalopram (LEXAPRO) 10 MG tablet    Sig: Take 1 tablet (10 mg total) by mouth daily.    Dispense:  30 tablet    Refill:  0    CMA served as scribe during this visit. History, Physical and Plan performed by medical provider. Documentation and orders reviewed and attested to.  [name], DO

## 2017-05-08 NOTE — Patient Instructions (Signed)

## 2017-05-09 DIAGNOSIS — L718 Other rosacea: Secondary | ICD-10-CM | POA: Diagnosis not present

## 2017-05-09 DIAGNOSIS — L720 Epidermal cyst: Secondary | ICD-10-CM | POA: Diagnosis not present

## 2017-05-09 LAB — COMPREHENSIVE METABOLIC PANEL
ALBUMIN: 4.4 g/dL (ref 3.5–5.2)
ALT: 25 U/L (ref 0–35)
AST: 26 U/L (ref 0–37)
Alkaline Phosphatase: 72 U/L (ref 39–117)
BILIRUBIN TOTAL: 0.4 mg/dL (ref 0.2–1.2)
BUN: 12 mg/dL (ref 6–23)
CALCIUM: 10.1 mg/dL (ref 8.4–10.5)
CO2: 24 meq/L (ref 19–32)
CREATININE: 0.66 mg/dL (ref 0.40–1.20)
Chloride: 104 mEq/L (ref 96–112)
GFR: 95.17 mL/min (ref >60.00)
Glucose, Bld: 94 mg/dL (ref 70–99)
Potassium: 4.2 mEq/L (ref 3.5–5.1)
Sodium: 137 mEq/L (ref 135–145)
Total Protein: 6.9 g/dL (ref 6.0–8.3)

## 2017-05-09 LAB — LIPID PANEL
CHOLESTEROL: 215 mg/dL — AB (ref 0–200)
HDL: 49.5 mg/dL (ref >39.00)
LDL Cholesterol: 126 mg/dL — ABNORMAL HIGH (ref 0–99)
NonHDL: 165.67
Total CHOL/HDL Ratio: 4
Triglycerides: 200 mg/dL — ABNORMAL HIGH (ref 0.0–149.0)
VLDL: 40 mg/dL (ref 0.0–40.0)

## 2017-05-09 LAB — HEPATITIS C ANTIBODY: HCV AB: NEGATIVE

## 2017-05-10 ENCOUNTER — Other Ambulatory Visit: Payer: Self-pay | Admitting: *Deleted

## 2017-05-10 DIAGNOSIS — I10 Essential (primary) hypertension: Secondary | ICD-10-CM

## 2017-05-10 DIAGNOSIS — E785 Hyperlipidemia, unspecified: Secondary | ICD-10-CM

## 2017-05-17 ENCOUNTER — Telehealth: Payer: Self-pay | Admitting: Family Medicine

## 2017-05-17 DIAGNOSIS — Z1239 Encounter for other screening for malignant neoplasm of breast: Secondary | ICD-10-CM

## 2017-05-17 NOTE — Telephone Encounter (Signed)
Left message on machine that order was placed.

## 2017-05-17 NOTE — Telephone Encounter (Signed)
Relation to ZO:XWRUpt:self Call back number:[phone]   Reason for call:  [facility],please advise when orders are place

## 2017-05-22 ENCOUNTER — Encounter: Payer: Self-pay | Admitting: *Deleted

## 2017-06-05 ENCOUNTER — Encounter (HOSPITAL_BASED_OUTPATIENT_CLINIC_OR_DEPARTMENT_OTHER): Payer: Self-pay

## 2017-06-05 ENCOUNTER — Ambulatory Visit (HOSPITAL_BASED_OUTPATIENT_CLINIC_OR_DEPARTMENT_OTHER)
Admission: RE | Admit: 2017-06-05 | Discharge: 2017-06-05 | Disposition: A | Discharge status: Home / Self Care | Payer: Medicare Other | Source: Ambulatory Visit | Attending: Family Medicine | Admitting: Family Medicine

## 2017-06-05 DIAGNOSIS — Z1231 Encounter for screening mammogram for malignant neoplasm of breast: Secondary | ICD-10-CM | POA: Insufficient documentation

## 2017-06-05 DIAGNOSIS — Z1239 Encounter for other screening for malignant neoplasm of breast: Secondary | ICD-10-CM

## 2017-06-08 ENCOUNTER — Encounter: Payer: Self-pay | Admitting: Family Medicine

## 2017-06-08 ENCOUNTER — Ambulatory Visit (INDEPENDENT_AMBULATORY_CARE_PROVIDER_SITE_OTHER): Payer: Medicare Other | Admitting: Family Medicine

## 2017-06-08 DIAGNOSIS — F411 Generalized anxiety disorder: Secondary | ICD-10-CM | POA: Diagnosis not present

## 2017-06-08 NOTE — Assessment & Plan Note (Signed)
con't lexapro D/c wellbutrin F/u 2 months or sooner prn

## 2017-06-08 NOTE — Patient Instructions (Signed)

## 2017-06-08 NOTE — Progress Notes (Signed)
Pre visit review using our clinic review tool, if applicable. No additional management support is needed unless otherwise documented below in the visit note. 

## 2017-06-08 NOTE — Progress Notes (Signed)
Patient ID: Erin Acosta, female    DOB: [birth year]  Age: 66 y.o. MRN: [id number]    Subjective:  Subjective  HPI Erin Acosta presents for f/u depression.  Doing well with lexapro  Review of Systems  Constitutional: Negative for activity change, appetite change, fatigue and unexpected weight change.  Respiratory: Negative for cough and shortness of breath.   Cardiovascular: Negative for chest pain and palpitations.  Psychiatric/Behavioral: Negative for behavioral problems and dysphoric mood. The patient is not nervous/anxious.     History Past Medical History:  Diagnosis Date  . Allergy   . Anxiety   . Depression   . Genital herpes   . H/O cesarean section   . Hypertension   . Rotator cuff tear   . Scoliosis     She has a past surgical history that includes Bilateral carpal tunnel release; Trigger finger release; Foot surgery (Right); Cesarean section; tummy tuck; mini face lift; Blepharoplasty; and Breast excisional biopsy.   Her family history includes Arthritis in her unknown relative; Heart disease in her unknown relative; Hyperlipidemia in her unknown relative; Hypertension in her unknown relative; Stroke in her unknown relative.She reports that she has never smoked. She has never used smokeless tobacco. She reports that she drinks about 0.6 oz of alcohol per week . She reports that she does not use drugs.  Current Outpatient Prescriptions on File Prior to Visit  Medication Sig Dispense Refill  . aspirin EC 81 MG tablet Take 81 mg by mouth daily.    Marland Kitchen buPROPion (WELLBUTRIN XL) 150 MG 24 hr tablet Take 1 tablet (150 mg total) by mouth daily. 30 tablet 0  . clonazePAM (KLONOPIN) 1 MG tablet Take 1 tablet (1 mg total) by mouth daily as needed. 90 tablet 0  . escitalopram (LEXAPRO) 10 MG tablet Take 1 tablet (10 mg total) by mouth daily. 30 tablet 0  . fluticasone (FLONASE) 50 MCG/ACT nasal spray Place 2 sprays into both nostrils daily. 16 g 1  . lisinopril  (PRINIVIL,ZESTRIL) 5 MG tablet Take 1 tablet (5 mg total) by mouth daily.    . montelukast (SINGULAIR) 10 MG tablet Take 1 tablet (10 mg total) by mouth daily.    . Multiple Vitamin (MULTIVITAMIN) capsule Take 1 capsule by mouth daily.    . Probiotic Product (PROBIOTIC DAILY PO) Take by mouth.    . valACYclovir (VALTREX) 500 MG tablet Take 1 tablet (500 mg total) by mouth daily. 90 tablet 1   No current facility-administered medications on file prior to visit.      Objective:  Objective  Physical Exam  Constitutional: She is oriented to person, place, and time. She appears well-developed and well-nourished.  HENT:  Head: Normocephalic and atraumatic.  Eyes: Conjunctivae and EOM are normal.  Neck: Normal range of motion. Neck supple. No JVD present. Carotid bruit is not present. No thyromegaly present.  Cardiovascular: Normal rate, regular rhythm and normal heart sounds.   No murmur heard. Pulmonary/Chest: Effort normal and breath sounds normal. No respiratory distress. She has no wheezes. She has no rales. She exhibits no tenderness.  Musculoskeletal: She exhibits no edema.  Neurological: She is alert and oriented to person, place, and time.  Psychiatric: She has a normal mood and affect. Her behavior is normal. Judgment and thought content normal.  Nursing note and vitals reviewed.  BP 102/68 (BP Location: Left Arm, Patient Position: Sitting, Cuff Size: Normal)   Pulse 70   Temp 97.6 F (36.4 C) (Oral)   Ht  5' (1.524 m)   Wt 136 lb 8 oz (61.9 kg)   SpO2 98%   BMI 26.66 kg/m  Wt Readings from Last 3 Encounters:  06/08/17 136 lb 8 oz (61.9 kg)  05/08/17 136 lb 12.8 oz (62.1 kg)  01/31/17 132 lb 6.4 oz (60.1 kg)     Lab Results  Component Value Date   WBC 8.7 02/09/2017   HGB 14.4 02/09/2017   HCT 42.4 02/09/2017   PLT 289.0 02/09/2017   GLUCOSE 94 05/08/2017   CHOL 215 (H) 05/08/2017   TRIG 200.0 (H) 05/08/2017   HDL 49.50 05/08/2017   LDLDIRECT 153.0 06/06/2016    LDLCALC 126 (H) 05/08/2017   ALT 25 05/08/2017   AST 26 05/08/2017   NA 137 05/08/2017   K 4.2 05/08/2017   CL 104 05/08/2017   CREATININE 0.66 05/08/2017   BUN 12 05/08/2017   CO2 24 05/08/2017    Mm Screening Breast Tomo Bilateral  Result Date: 06/05/2017 CLINICAL DATA:  Screening. EXAM: 2D DIGITAL SCREENING BILATERAL MAMMOGRAM WITH CAD AND ADJUNCT TOMO COMPARISON:  Previous exam(s). ACR Breast Density Category b: There are scattered areas of fibroglandular density. FINDINGS: There are no findings suspicious for malignancy. Images were processed with CAD. IMPRESSION: No mammographic evidence of malignancy. A result letter of this screening mammogram will be mailed directly to the patient. RECOMMENDATION: Screening mammogram in one year. (Code:SM-B-01Y) BI-RADS CATEGORY  1: Negative. Electronically Signed   By: Amie Portlandavid  Ormond M.D.   On: 06/05/2017 16:22     Assessment & Plan:  Plan  I am having Erin Acosta maintain her aspirin EC, multivitamin, lisinopril, montelukast, Probiotic Product (PROBIOTIC DAILY PO), fluticasone, valACYclovir, clonazePAM, buPROPion, escitalopram, and Minocycline HCl.  Meds ordered this encounter  Medications  . Minocycline HCl (SOLODYN) 55 MG TB24    Sig: Take 1 tablet by mouth daily.    Problem List Items Addressed This Visit      Unprioritized   Generalized anxiety disorder    con't lexapro D/c wellbutrin F/u 2 months or sooner prn          Follow-up: Return in about 2 months (around 08/09/2017).  Donato SchultzYvonne R Lowne Chase, DO

## 2017-06-09 ENCOUNTER — Other Ambulatory Visit: Payer: Self-pay | Admitting: Family Medicine

## 2017-06-09 DIAGNOSIS — F411 Generalized anxiety disorder: Secondary | ICD-10-CM

## 2017-06-09 MED ORDER — ESCITALOPRAM OXALATE 10 MG PO TABS: 10.0000 mg | ORAL_TABLET | Freq: Every day | ORAL | 1 refills | Status: DC

## 2017-06-09 NOTE — Telephone Encounter (Signed)
Sent in as requested lexapro to mail order. Called the patient left detailed message sent in refill.

## 2017-06-09 NOTE — Telephone Encounter (Signed)
°  Relation to WU:JWJXpt:self Call back number:[phone] Pharmacy:  [facility] [place] [address] [phone] (Phone) [phone])    Reason for call:  Patient requesting a refill escitalopram (LEXAPRO) 10 MG tablet

## 2017-06-21 ENCOUNTER — Telehealth: Payer: Self-pay | Admitting: Family Medicine

## 2017-06-21 NOTE — Telephone Encounter (Signed)
Called pt to schedule awv. Lvm for pt to call office to schedule appt.  °

## 2017-06-23 ENCOUNTER — Telehealth: Payer: Self-pay | Admitting: Family Medicine

## 2017-06-23 DIAGNOSIS — F411 Generalized anxiety disorder: Secondary | ICD-10-CM

## 2017-06-23 MED ORDER — CLONAZEPAM 1 MG PO TABS: 1.0000 mg | ORAL_TABLET | Freq: Every day | ORAL | 0 refills | Status: DC | PRN

## 2017-06-23 NOTE — Telephone Encounter (Signed)
Relation to ZO:XWRUpt:self Call back [phone] Pharmacy: [facility] [place] [address] [phone] (Phone) [phone] (Fax)     Reason for call:  Patient requesting a 90 day supply clonazePAM (KLONOPIN) 1 MG tablet  `

## 2017-06-23 NOTE — Telephone Encounter (Signed)
   Last refill 03/20/2017  #90 Last ov 06/08/2017 No contact/no uds   [facility]  #[facility] day per Dr. Carmelia RollerWendling ok.

## 2017-07-21 DIAGNOSIS — Z124 Encounter for screening for malignant neoplasm of cervix: Secondary | ICD-10-CM | POA: Diagnosis not present

## 2017-07-21 DIAGNOSIS — R300 Dysuria: Secondary | ICD-10-CM | POA: Diagnosis not present

## 2017-07-21 DIAGNOSIS — Z01419 Encounter for gynecological examination (general) (routine) without abnormal findings: Secondary | ICD-10-CM | POA: Diagnosis not present

## 2017-07-21 DIAGNOSIS — N760 Acute vaginitis: Secondary | ICD-10-CM | POA: Diagnosis not present

## 2017-07-21 DIAGNOSIS — Z1151 Encounter for screening for human papillomavirus (HPV): Secondary | ICD-10-CM | POA: Diagnosis not present

## 2017-08-10 ENCOUNTER — Other Ambulatory Visit: Payer: Medicare Other

## 2017-08-24 ENCOUNTER — Encounter: Payer: Self-pay | Admitting: Family Medicine

## 2017-08-24 ENCOUNTER — Ambulatory Visit (INDEPENDENT_AMBULATORY_CARE_PROVIDER_SITE_OTHER): Payer: Medicare Other | Admitting: Family Medicine

## 2017-08-24 ENCOUNTER — Ambulatory Visit (INDEPENDENT_AMBULATORY_CARE_PROVIDER_SITE_OTHER): Payer: Medicare Other | Admitting: Pulmonary Disease

## 2017-08-24 ENCOUNTER — Encounter: Payer: Self-pay | Admitting: Pulmonary Disease

## 2017-08-24 VITALS — BP 102/56 | HR 63 | Temp 97.6°F | Ht 60.0 in | Wt 137.6 lb

## 2017-08-24 DIAGNOSIS — R918 Other nonspecific abnormal finding of lung field: Secondary | ICD-10-CM | POA: Diagnosis not present

## 2017-08-24 DIAGNOSIS — R911 Solitary pulmonary nodule: Secondary | ICD-10-CM | POA: Diagnosis not present

## 2017-08-24 DIAGNOSIS — I10 Essential (primary) hypertension: Secondary | ICD-10-CM | POA: Diagnosis not present

## 2017-08-24 DIAGNOSIS — J479 Bronchiectasis, uncomplicated: Secondary | ICD-10-CM

## 2017-08-24 DIAGNOSIS — F418 Other specified anxiety disorders: Secondary | ICD-10-CM

## 2017-08-24 DIAGNOSIS — F411 Generalized anxiety disorder: Secondary | ICD-10-CM | POA: Diagnosis not present

## 2017-08-24 DIAGNOSIS — E785 Hyperlipidemia, unspecified: Secondary | ICD-10-CM

## 2017-08-24 LAB — COMPREHENSIVE METABOLIC PANEL
ALT: 13 U/L (ref 0–35)
AST: 12 U/L (ref 0–37)
Albumin: 4 g/dL (ref 3.5–5.2)
Alkaline Phosphatase: 67 U/L (ref 39–117)
BUN: 13 mg/dL (ref 6–23)
CO2: 28 meq/L (ref 19–32)
Calcium: 9.4 mg/dL (ref 8.4–10.5)
Chloride: 104 mEq/L (ref 96–112)
Creatinine, Ser: 0.64 mg/dL (ref 0.40–1.20)
GFR: 98.52 mL/min (ref >60.00)
GLUCOSE: 102 mg/dL — AB (ref 70–99)
POTASSIUM: 4.5 meq/L (ref 3.5–5.1)
Sodium: 139 mEq/L (ref 135–145)
Total Bilirubin: 0.5 mg/dL (ref 0.2–1.2)
Total Protein: 6.8 g/dL (ref 6.0–8.3)

## 2017-08-24 LAB — LIPID PANEL
CHOL/HDL RATIO: 5
CHOLESTEROL: 224 mg/dL — AB (ref 0–200)
HDL: 49.6 mg/dL (ref >39.00)
LDL CALC: 141 mg/dL — AB (ref 0–99)
NonHDL: 174.27
Triglycerides: 166 mg/dL — ABNORMAL HIGH (ref 0.0–149.0)
VLDL: 33.2 mg/dL (ref 0.0–40.0)

## 2017-08-24 LAB — TSH: TSH: 1.43 u[IU]/mL (ref 0.35–4.50)

## 2017-08-24 LAB — CBC WITH DIFFERENTIAL/PLATELET
BASOS PCT: 0.8 % (ref 0.0–3.0)
Basophils Absolute: 0.1 10*3/uL (ref 0.0–0.1)
EOS ABS: 0.1 10*3/uL (ref 0.0–0.7)
Eosinophils Relative: 1.9 % (ref 0.0–5.0)
HCT: 45.1 % (ref 36.0–46.0)
Hemoglobin: 15.1 g/dL — ABNORMAL HIGH (ref 12.0–15.0)
LYMPHS ABS: 1.7 10*3/uL (ref 0.7–4.0)
Lymphocytes Relative: 22.7 % (ref 12.0–46.0)
MCHC: 33.6 g/dL (ref 30.0–36.0)
MCV: 91.3 fl (ref 78.0–100.0)
MONOS PCT: 7.2 % (ref 3.0–12.0)
Monocytes Absolute: 0.5 10*3/uL (ref 0.1–1.0)
NEUTROS ABS: 5.1 10*3/uL (ref 1.4–7.7)
NEUTROS PCT: 67.4 % (ref 43.0–77.0)
PLATELETS: 268 10*3/uL (ref 150.0–400.0)
RBC: 4.93 Mil/uL (ref 3.87–5.11)
RDW: 14.3 % (ref 11.5–15.5)
WBC: 7.5 10*3/uL (ref 4.0–10.5)

## 2017-08-24 LAB — POC URINALSYSI DIPSTICK (AUTOMATED)
Bilirubin, UA: NEGATIVE
Blood, UA: NEGATIVE
GLUCOSE UA: NEGATIVE
Ketones, UA: NEGATIVE
Leukocytes, UA: NEGATIVE
NITRITE UA: NEGATIVE
PROTEIN UA: NEGATIVE
Spec Grav, UA: 1.02 (ref 1.010–1.025)
UROBILINOGEN UA: 0.2 U/dL
pH, UA: 6 (ref 5.0–8.0)

## 2017-08-24 MED ORDER — CLONAZEPAM 1 MG PO TABS: 1.0000 mg | ORAL_TABLET | Freq: Every day | ORAL | 2 refills | Status: DC | PRN

## 2017-08-24 MED ORDER — ESCITALOPRAM OXALATE 10 MG PO TABS: 10.0000 mg | ORAL_TABLET | Freq: Every day | ORAL | 3 refills | Status: DC

## 2017-08-24 NOTE — Assessment & Plan Note (Signed)
Repeat 1 year follow-up CT chest without contrast

## 2017-08-24 NOTE — Assessment & Plan Note (Signed)
Stable con't meds 

## 2017-08-24 NOTE — Assessment & Plan Note (Signed)
Well controlled, no changes to meds. Encouraged heart healthy diet such as the DASH diet and exercise as tolerated.  Actually running low--  Will wait until after she heals from surgery then she she will hold med and f/u here in 2-3 weeks

## 2017-08-24 NOTE — Assessment & Plan Note (Signed)
Check labs  Encouraged heart healthy diet, increase exercise, avoid trans fats, consider a krill oil cap daily 

## 2017-08-24 NOTE — Patient Instructions (Signed)
High-resolution CT chest without contrast

## 2017-08-24 NOTE — Progress Notes (Signed)
   Subjective:    Patient ID: Erin Acosta, female    DOB: [birth year], 66 y.o.   MRN: [id number]  HPI  66 year old never smoker  for FU of pulmonary nodule, first noted 2013 She moved from [place] to [place] in 2016.  Her husband was in the [employer] and she lived in various states she grew up in , then lived in [place], [place] and finally [place] for many years.  Her CT in 2017 showed mild bronchiectasis and mucus impaction in the right middle lobe otherwise stable nodule. She denies cough sputum production frequent chest colds. She reports frequent sinusitis as a young adult including requiring a tonsillectomy at age 87  She is scheduled for a facelift next week   Significant tests/ events reviewed  CT chest 10/2014 showed several tiny lung nodules largest was a right lower lobe about 1 cm. This seemed to be stable when compared to a chest x-ray from 04/2012 CT scan chest without contrast in 03/2015 showed stable right lower lobe nodule measuring 11 mm.  CT chest 08/2016 RLL nodule stable, RUL/ RML mild bronchiectasis, RML  Mucoid impaction    Review of Systems Patient denies significant dyspnea,cough, hemoptysis,  chest pain, palpitations, pedal edema, orthopnea, paroxysmal nocturnal dyspnea, lightheadedness, nausea, vomiting, abdominal or  leg pains      Objective:   Physical Exam  Gen. Pleasant, well-nourished, in no distress ENT - no thrush, no post nasal drip Neck: No JVD, no thyromegaly, no carotid bruits Lungs: no use of accessory muscles, no dullness to percussion, clear without rales or rhonchi  Cardiovascular: Rhythm regular, heart sounds  normal, no murmurs or gallops, no peripheral edema Musculoskeletal: No deformities, no cyanosis or clubbing        Assessment & Plan:

## 2017-08-24 NOTE — Addendum Note (Signed)
Addended by: Maurene Capes on: 08/24/2017 03:11 PM   Modules accepted: Orders

## 2017-08-24 NOTE — Progress Notes (Signed)
Patient ID: Erin Acosta, female    DOB: Nov 20, 1951  Age: 66 y.o. MRN: [id number]    Subjective:  Subjective  HPI Erin Acosta presents for f/u bp and depression.  No complaints.  She is having a face lift Monday.  She would like to hold off on flu and pneum until after surgery.    Review of Systems  Constitutional: Negative for activity change, appetite change, fatigue and unexpected weight change.  Respiratory: Negative for cough and shortness of breath.   Cardiovascular: Negative for chest pain and palpitations.  Psychiatric/Behavioral: Negative for behavioral problems and dysphoric mood. The patient is not nervous/anxious.     History Past Medical History:  Diagnosis Date  . Allergy   . Anxiety   . Depression   . Genital herpes   . H/O cesarean section   . Hypertension   . Rotator cuff tear   . Scoliosis     She has a past surgical history that includes Bilateral carpal tunnel release; Trigger finger release; Foot surgery (Right); Cesarean section; tummy tuck; mini face lift; Blepharoplasty; and Breast excisional biopsy.   Her family history includes Arthritis in her unknown relative; Heart disease in her unknown relative; Hyperlipidemia in her unknown relative; Hypertension in her unknown relative; Stroke in her unknown relative.She reports that she has never smoked. She has never used smokeless tobacco. She reports that she drinks about 0.6 oz of alcohol per week . She reports that she does not use drugs.  Current Outpatient Prescriptions on File Prior to Visit  Medication Sig Dispense Refill  . aspirin EC 81 MG tablet Take 81 mg by mouth daily.    Marland Kitchen lisinopril (PRINIVIL,ZESTRIL) 5 MG tablet Take 1 tablet (5 mg total) by mouth daily.    . montelukast (SINGULAIR) 10 MG tablet Take 1 tablet (10 mg total) by mouth daily.    . Probiotic Product (PROBIOTIC DAILY PO) Take by mouth.    . valACYclovir (VALTREX) 500 MG tablet Take 1 tablet (500 mg total) by mouth daily. 90  tablet 1   No current facility-administered medications on file prior to visit.      Objective:  Objective  Physical Exam  Constitutional: She is oriented to person, place, and time. She appears well-developed and well-nourished.  HENT:  Head: Normocephalic and atraumatic.  Eyes: Conjunctivae and EOM are normal.  Neck: Normal range of motion. Neck supple. No JVD present. Carotid bruit is not present. No thyromegaly present.  Cardiovascular: Normal rate, regular rhythm and normal heart sounds.   No murmur heard. Pulmonary/Chest: Effort normal and breath sounds normal. No respiratory distress. She has no wheezes. She has no rales. She exhibits no tenderness.  Musculoskeletal: She exhibits no edema.  Neurological: She is alert and oriented to person, place, and time.  Psychiatric: She has a normal mood and affect.  Nursing note and vitals reviewed.  BP (!) 102/56 (BP Location: Right Arm, Patient Position: Sitting, Cuff Size: Normal)   Pulse 63   Temp 97.6 F (36.4 C) (Oral)   Ht 5' (1.524 m)   Wt 137 lb 9.6 oz (62.4 kg)   SpO2 98%   BMI 26.87 kg/m  Wt Readings from Last 3 Encounters:  08/24/17 137 lb 9.6 oz (62.4 kg)  06/08/17 136 lb 8 oz (61.9 kg)  05/08/17 136 lb 12.8 oz (62.1 kg)     Lab Results  Component Value Date   WBC 8.7 02/09/2017   HGB 14.4 02/09/2017   HCT 42.4 02/09/2017  PLT 289.0 02/09/2017   GLUCOSE 94 05/08/2017   CHOL 215 (H) 05/08/2017   TRIG 200.0 (H) 05/08/2017   HDL 49.50 05/08/2017   LDLDIRECT 153.0 06/06/2016   LDLCALC 126 (H) 05/08/2017   ALT 25 05/08/2017   AST 26 05/08/2017   NA 137 05/08/2017   K 4.2 05/08/2017   CL 104 05/08/2017   CREATININE 0.66 05/08/2017   BUN 12 05/08/2017   CO2 24 05/08/2017    Mm Screening Breast Tomo Bilateral  Result Date: 06/05/2017 CLINICAL DATA:  Screening. EXAM: 2D DIGITAL SCREENING BILATERAL MAMMOGRAM WITH CAD AND ADJUNCT TOMO COMPARISON:  Previous exam(s). ACR Breast Density Category b: There are  scattered areas of fibroglandular density. FINDINGS: There are no findings suspicious for malignancy. Images were processed with CAD. IMPRESSION: No mammographic evidence of malignancy. A result letter of this screening mammogram will be mailed directly to the patient. RECOMMENDATION: Screening mammogram in one year. (Code:SM-B-01Y) BI-RADS CATEGORY  1: Negative. Electronically Signed   By: Amie Portland M.D.   On: 06/05/2017 16:22     Assessment & Plan:  Plan  I have discontinued Ms. Lorman's multivitamin, fluticasone, buPROPion, and Minocycline HCl. I am also having her maintain her aspirin EC, lisinopril, montelukast, Probiotic Product (PROBIOTIC DAILY PO), valACYclovir, Collagenase, psyllium, Whey Protein Concentrate, clonazePAM, and escitalopram.  Meds ordered this encounter  Medications  . Collagenase POWD    Sig: by Does not apply route daily.  . psyllium (KONSYL) 33 % POWD    Sig: Take by mouth daily.  . Whey Protein Concentrate POWD    Sig: Take by mouth daily.  . clonazePAM (KLONOPIN) 1 MG tablet    Sig: Take 1 tablet (1 mg total) by mouth daily as needed.    Dispense:  30 tablet    Refill:  2  . escitalopram (LEXAPRO) 10 MG tablet    Sig: Take 1 tablet (10 mg total) by mouth daily.    Dispense:  90 tablet    Refill:  3    Problem List Items Addressed This Visit      Unprioritized   Depression with anxiety   Relevant Medications   escitalopram (LEXAPRO) 10 MG tablet   Other Relevant Orders   Lipid panel   CBC with Differential/Platelet   Comprehensive metabolic panel   TSH   POCT Urinalysis Dipstick (Automated)   Essential hypertension - Primary    Well controlled, no changes to meds. Encouraged heart healthy diet such as the DASH diet and exercise as tolerated.  Actually running low--  Will wait until after she heals from surgery then she she will hold med and f/u here in 2-3 weeks       Relevant Orders   Lipid panel   CBC with Differential/Platelet    Comprehensive metabolic panel   TSH   POCT Urinalysis Dipstick (Automated)   Generalized anxiety disorder    Stable con't meds      Relevant Medications   clonazePAM (KLONOPIN) 1 MG tablet   escitalopram (LEXAPRO) 10 MG tablet   Hyperlipidemia    Check labs Encouraged heart healthy diet, increase exercise, avoid trans fats, consider a krill oil cap daily         Follow-up: Return in about 6 months (around 02/22/2018), or if symptoms worsen or fail to improve, for hypertension.  [name], DO

## 2017-08-24 NOTE — Assessment & Plan Note (Signed)
She has mild bronchiectasis with mucoid impaction noted in the right middle lobe raising question of MAC infection however she has not symptomatic  We'll obtain high-resolution CT  to clarify appearance

## 2017-08-24 NOTE — Patient Instructions (Signed)

## 2017-08-28 ENCOUNTER — Other Ambulatory Visit: Payer: Self-pay | Admitting: Family Medicine

## 2017-08-28 DIAGNOSIS — E785 Hyperlipidemia, unspecified: Secondary | ICD-10-CM

## 2017-09-04 DIAGNOSIS — J3089 Other allergic rhinitis: Secondary | ICD-10-CM | POA: Diagnosis not present

## 2017-09-04 DIAGNOSIS — J019 Acute sinusitis, unspecified: Secondary | ICD-10-CM | POA: Diagnosis not present

## 2017-09-04 DIAGNOSIS — B373 Candidiasis of vulva and vagina: Secondary | ICD-10-CM | POA: Diagnosis not present

## 2017-09-08 ENCOUNTER — Other Ambulatory Visit: Payer: Medicare Other

## 2017-09-14 ENCOUNTER — Ambulatory Visit (HOSPITAL_BASED_OUTPATIENT_CLINIC_OR_DEPARTMENT_OTHER)
Admission: RE | Admit: 2017-09-14 | Discharge: 2017-09-14 | Disposition: A | Discharge status: Home / Self Care | Payer: Medicare Other | Source: Ambulatory Visit | Attending: Pulmonary Disease | Admitting: Pulmonary Disease

## 2017-09-14 DIAGNOSIS — I700 Atherosclerosis of aorta: Secondary | ICD-10-CM | POA: Insufficient documentation

## 2017-09-14 DIAGNOSIS — R918 Other nonspecific abnormal finding of lung field: Secondary | ICD-10-CM | POA: Diagnosis not present

## 2017-09-14 DIAGNOSIS — R911 Solitary pulmonary nodule: Secondary | ICD-10-CM

## 2017-09-14 DIAGNOSIS — I2510 Atherosclerotic heart disease of native coronary artery without angina pectoris: Secondary | ICD-10-CM | POA: Insufficient documentation

## 2017-09-18 ENCOUNTER — Other Ambulatory Visit: Payer: Self-pay

## 2017-09-18 DIAGNOSIS — R911 Solitary pulmonary nodule: Secondary | ICD-10-CM

## 2017-10-11 ENCOUNTER — Telehealth: Payer: Self-pay | Admitting: Family Medicine

## 2017-10-11 DIAGNOSIS — I10 Essential (primary) hypertension: Secondary | ICD-10-CM

## 2017-10-11 MED ORDER — LISINOPRIL 5 MG PO TABS: 5.0000 mg | ORAL_TABLET | Freq: Every day | ORAL | 2 refills | Status: DC

## 2017-10-11 MED ORDER — VALACYCLOVIR HCL 500 MG PO TABS: 500.0000 mg | ORAL_TABLET | Freq: Every day | ORAL | 1 refills | Status: DC

## 2017-10-11 NOTE — Telephone Encounter (Signed)
Pt called and notified requested medications will be refilled. No further concerns voiced at this time.

## 2017-10-11 NOTE — Telephone Encounter (Signed)
Copied from CRM [phone]. Topic: Quick Communication - See Telephone Encounter >> Oct 11, 2017 11:04 AM Viviann SpareWhite, Selina wrote: Patient requesting a refill on the following Rx:  Lisinopril (Prinivil, Zestril) 5 mg tabs Valacyclovir(Valtrex) 500 mg   CRM for notification. See Telephone encounter for: 10/11/17.

## 2017-10-26 ENCOUNTER — Other Ambulatory Visit: Payer: Self-pay

## 2018-01-18 DIAGNOSIS — H10413 Chronic giant papillary conjunctivitis, bilateral: Secondary | ICD-10-CM | POA: Diagnosis not present

## 2018-01-18 DIAGNOSIS — H2513 Age-related nuclear cataract, bilateral: Secondary | ICD-10-CM | POA: Diagnosis not present

## 2018-01-18 DIAGNOSIS — H04123 Dry eye syndrome of bilateral lacrimal glands: Secondary | ICD-10-CM | POA: Diagnosis not present

## 2018-01-30 ENCOUNTER — Other Ambulatory Visit: Payer: Self-pay | Admitting: Family Medicine

## 2018-01-30 MED ORDER — VALACYCLOVIR HCL 500 MG PO TABS: 500.0000 mg | ORAL_TABLET | Freq: Every day | ORAL | 1 refills | Status: DC

## 2018-01-30 NOTE — Telephone Encounter (Signed)
LOV 08/24/17 Dr. [facility]

## 2018-01-30 NOTE — Telephone Encounter (Signed)
Copied from CRM [phone]. Topic: Quick Communication - Rx Refill/Question >> Jan 30, 2018 10:41 AM Alexander BergeronBarksdale, Harvey B wrote: Medication: valACYclovir (VALTREX) 500 MG tablet [[id number]][[id number]]    Has the patient contacted their pharmacy? Yes.     (Agent: If no, request that the patient contact the pharmacy for the refill.)   Preferred Pharmacy (with phone number or street name): [facility]   Agent: Please be advised that RX refills may take up to 3 business days. We ask that you follow-up with your pharmacy.

## 2018-02-19 DIAGNOSIS — M2021 Hallux rigidus, right foot: Secondary | ICD-10-CM | POA: Diagnosis not present

## 2018-02-19 DIAGNOSIS — M2041 Other hammer toe(s) (acquired), right foot: Secondary | ICD-10-CM | POA: Diagnosis not present

## 2018-02-19 DIAGNOSIS — M79671 Pain in right foot: Secondary | ICD-10-CM | POA: Diagnosis not present

## 2018-02-21 DIAGNOSIS — H10413 Chronic giant papillary conjunctivitis, bilateral: Secondary | ICD-10-CM | POA: Diagnosis not present

## 2018-02-21 DIAGNOSIS — H04123 Dry eye syndrome of bilateral lacrimal glands: Secondary | ICD-10-CM | POA: Diagnosis not present

## 2018-02-21 DIAGNOSIS — H16143 Punctate keratitis, bilateral: Secondary | ICD-10-CM | POA: Diagnosis not present

## 2018-02-21 DIAGNOSIS — H1131 Conjunctival hemorrhage, right eye: Secondary | ICD-10-CM | POA: Diagnosis not present

## 2018-02-26 DIAGNOSIS — H16143 Punctate keratitis, bilateral: Secondary | ICD-10-CM | POA: Diagnosis not present

## 2018-02-26 DIAGNOSIS — H04123 Dry eye syndrome of bilateral lacrimal glands: Secondary | ICD-10-CM | POA: Diagnosis not present

## 2018-02-26 DIAGNOSIS — H1131 Conjunctival hemorrhage, right eye: Secondary | ICD-10-CM | POA: Diagnosis not present

## 2018-02-26 DIAGNOSIS — H10413 Chronic giant papillary conjunctivitis, bilateral: Secondary | ICD-10-CM | POA: Diagnosis not present

## 2018-04-24 ENCOUNTER — Other Ambulatory Visit: Payer: Self-pay | Admitting: Family Medicine

## 2018-04-24 DIAGNOSIS — F411 Generalized anxiety disorder: Secondary | ICD-10-CM

## 2018-04-24 NOTE — Telephone Encounter (Signed)
Copied from CRM [phone]. Topic: Quick Communication - See Telephone Encounter >> Apr 24, 2018  2:20 PM Terisa Starraylor, Brittany L wrote: CRM for notification. See Telephone encounter for: 04/24/18.  clonazePAM (KLONOPIN) 1 MG tablet ( 90 day supply )  [facility] [place] - [address]

## 2018-04-25 NOTE — Telephone Encounter (Signed)
Clonazepam  refill Last Refill:08/24/17 #30 Last OV: 08/24/17 PCP: Dr. Zola [facility] Pharmacy:[facility]

## 2018-04-26 MED ORDER — CLONAZEPAM 1 MG PO TABS: 1.0000 mg | ORAL_TABLET | Freq: Every day | ORAL | 0 refills | Status: DC | PRN

## 2018-04-26 NOTE — Telephone Encounter (Signed)
Requesting:Clonazepam Contract:none,needs csc WJX:BJYNDS:none, needs uds Last Visit:08/24/17 Next Visit:none Last Refill:08/24/17 2-refills  Patient is requesting 90 day supply.   Please Advise, data base ran and is on your desk for review.

## 2018-04-27 ENCOUNTER — Encounter: Payer: Self-pay | Admitting: *Deleted

## 2018-04-27 ENCOUNTER — Telehealth: Payer: Self-pay | Admitting: *Deleted

## 2018-04-27 NOTE — Telephone Encounter (Signed)
Patient calling and states that she will come by on Monday (04/30/18). States that she would like [facility]. Informed her that she would likely need to come and give the UDS and sign the controlled substance contract before [facility] could [facility].

## 2018-04-27 NOTE — Telephone Encounter (Signed)
Prescription is ready for pickup.  She will need to sign a control substance contract and do UDS.  Left message on machine to call back.  Rx is on sheketias desk.  Will need to print contract and explain control substance laws to patient.

## 2018-04-30 NOTE — Telephone Encounter (Signed)
I will print out contract once patient comes in.

## 2018-05-01 ENCOUNTER — Other Ambulatory Visit: Payer: Self-pay | Admitting: *Deleted

## 2018-05-01 DIAGNOSIS — Z79899 Other long term (current) drug therapy: Secondary | ICD-10-CM

## 2018-05-01 DIAGNOSIS — F418 Other specified anxiety disorders: Secondary | ICD-10-CM

## 2018-05-01 NOTE — Progress Notes (Signed)
lab

## 2018-05-04 ENCOUNTER — Encounter: Payer: Self-pay | Admitting: *Deleted

## 2018-05-04 ENCOUNTER — Other Ambulatory Visit: Payer: Self-pay | Admitting: Family Medicine

## 2018-05-04 ENCOUNTER — Other Ambulatory Visit: Payer: Medicare Other

## 2018-05-04 DIAGNOSIS — F418 Other specified anxiety disorders: Secondary | ICD-10-CM

## 2018-05-04 DIAGNOSIS — Z1231 Encounter for screening mammogram for malignant neoplasm of breast: Secondary | ICD-10-CM

## 2018-05-04 DIAGNOSIS — Z79899 Other long term (current) drug therapy: Secondary | ICD-10-CM | POA: Diagnosis not present

## 2018-05-04 NOTE — Telephone Encounter (Signed)
Patient came in for UDS/Contract.  [facility]

## 2018-05-05 LAB — PAIN MGMT, PROFILE 8 W/CONF, U
6 ACETYLMORPHINE: NEGATIVE ng/mL (ref <10)
AMPHETAMINES: NEGATIVE ng/mL (ref <500)
Alcohol Metabolites: NEGATIVE ng/mL (ref <500)
Benzodiazepines: NEGATIVE ng/mL (ref <100)
Buprenorphine, Urine: NEGATIVE ng/mL (ref <5)
Cocaine Metabolite: NEGATIVE ng/mL (ref <150)
Creatinine: 65.4 mg/dL
MDMA: NEGATIVE ng/mL (ref <500)
Marijuana Metabolite: NEGATIVE ng/mL (ref <20)
OPIATES: NEGATIVE ng/mL (ref <100)
Oxidant: NEGATIVE ug/mL (ref <200)
Oxycodone: NEGATIVE ng/mL (ref <100)
PH: 6.66 (ref 4.5–9.0)

## 2018-05-10 ENCOUNTER — Encounter: Payer: Self-pay | Admitting: Family Medicine

## 2018-05-10 ENCOUNTER — Ambulatory Visit (INDEPENDENT_AMBULATORY_CARE_PROVIDER_SITE_OTHER): Payer: Medicare Other | Admitting: Family Medicine

## 2018-05-10 VITALS — BP 150/76 | HR 73 | Temp 98.0°F | Resp 18 | Wt 147.8 lb

## 2018-05-10 DIAGNOSIS — I10 Essential (primary) hypertension: Secondary | ICD-10-CM | POA: Diagnosis not present

## 2018-05-10 DIAGNOSIS — E785 Hyperlipidemia, unspecified: Secondary | ICD-10-CM

## 2018-05-10 DIAGNOSIS — F32A Depression, unspecified: Secondary | ICD-10-CM

## 2018-05-10 DIAGNOSIS — F419 Anxiety disorder, unspecified: Secondary | ICD-10-CM

## 2018-05-10 DIAGNOSIS — R079 Chest pain, unspecified: Secondary | ICD-10-CM | POA: Diagnosis not present

## 2018-05-10 DIAGNOSIS — F329 Major depressive disorder, single episode, unspecified: Secondary | ICD-10-CM

## 2018-05-10 DIAGNOSIS — R0602 Shortness of breath: Secondary | ICD-10-CM | POA: Diagnosis not present

## 2018-05-10 MED ORDER — FLUOXETINE HCL 20 MG PO TABS: 20.0000 mg | ORAL_TABLET | Freq: Every day | ORAL | 3 refills | Status: DC

## 2018-05-10 MED ORDER — LISINOPRIL 5 MG PO TABS: 5.0000 mg | ORAL_TABLET | Freq: Every day | ORAL | 1 refills | Status: DC

## 2018-05-10 MED ORDER — LISINOPRIL 5 MG PO TABS: 5.0000 mg | ORAL_TABLET | Freq: Every day | ORAL | 0 refills | Status: DC

## 2018-05-10 NOTE — Patient Instructions (Signed)
Nonspecific Chest Pain °Chest pain can be caused by many different conditions. There is always a chance that your pain could be related to something serious, such as a heart attack or a blood clot in your lungs. Chest pain can also be caused by conditions that are not life-threatening. If you have chest pain, it is very important to follow up with your health care provider. °What are the causes? °Causes of this condition include: °· Heartburn. °· Pneumonia or bronchitis. °· Anxiety or stress. °· Inflammation around your heart (pericarditis) or lung (pleuritis or pleurisy). °· A blood clot in your lung. °· A collapsed lung (pneumothorax). This can develop suddenly on its own (spontaneous pneumothorax) or from trauma to the chest. °· Shingles infection (varicella-zoster virus). °· Heart attack. °· Damage to the bones, muscles, and cartilage that make up your chest wall. This can include: °? Bruised bones due to injury. °? Strained muscles or cartilage due to frequent or repeated coughing or overwork. °? Fracture to one or more ribs. °? Sore cartilage due to inflammation (costochondritis). ° °What increases the risk? °Risk factors for this condition may include: °· Activities that increase your risk for trauma or injury to your chest. °· Respiratory infections or conditions that cause frequent coughing. °· Medical conditions or overeating that can cause heartburn. °· Heart disease or family history of heart disease. °· Conditions or health behaviors that increase your risk of developing a blood clot. °· Having had chicken pox (varicella zoster). ° °What are the signs or symptoms? °Chest pain can feel like: °· Burning or tingling on the surface of your chest or deep in your chest. °· Crushing, pressure, aching, or squeezing pain. °· Dull or sharp pain that is worse when you move, cough, or take a deep breath. °· Pain that is also felt in your back, neck, shoulder, or arm, or pain that spreads to any of these  areas. ° °Your chest pain may come and go, or it may stay constant. °How is this diagnosed? °Lab tests or other studies may be needed to find the cause of your pain. Your health care provider may have you take a test called an ECG (electrocardiogram). An ECG records your heartbeat patterns at the time the test is performed. You may also have other tests, such as: °· Transthoracic echocardiogram (TTE). In this test, sound waves are used to create a picture of the heart structures and to look at how blood flows through your heart. °· Transesophageal echocardiogram (TEE). This is a more advanced imaging test that takes images from inside your body. It allows your health care provider to see your heart in finer detail. °· Cardiac monitoring. This allows your health care provider to monitor your heart rate and rhythm in real time. °· Holter monitor. This is a portable device that records your heartbeat and can help to diagnose abnormal heartbeats. It allows your health care provider to track your heart activity for several days, if needed. °· Stress tests. These can be done through exercise or by taking medicine that makes your heart beat more quickly. °· Blood tests. °· Other imaging tests. ° °How is this treated? °Treatment depends on what is causing your chest pain. Treatment may include: °· Medicines. These may include: °? Acid blockers for heartburn. °? Anti-inflammatory medicine. °? Pain medicine for inflammatory conditions. °? Antibiotic medicine, if an infection is present. °? Medicines to dissolve blood clots. °? Medicines to treat coronary artery disease (CAD). °· Supportive care for conditions that   do not require medicines. This may include: °? Resting. °? Applying heat or cold packs to injured areas. °? Limiting activities until pain decreases. ° °Follow these instructions at home: °Medicines °· If you were prescribed an antibiotic, take it as told by your health care provider. Do not stop taking the  antibiotic even if you start to feel better. °· Take over-the-counter and prescription medicines only as told by your health care provider. °Lifestyle °· Do not use any products that contain nicotine or tobacco, such as cigarettes and e-cigarettes. If you need help quitting, ask your health care provider. °· Do not drink alcohol. °· Make lifestyle changes as directed by your health care provider. These may include: °? Getting regular exercise. Ask your health care provider to suggest some activities that are safe for you. °? Eating a heart-healthy diet. A registered dietitian can help you to learn healthy eating options. °? Maintaining a healthy weight. °? Managing diabetes, if necessary. °? Reducing stress, such as with yoga or relaxation techniques. °General instructions °· Avoid any activities that bring on chest pain. °· If heartburn is the cause for your chest pain, raise (elevate) the head of your bed about 6 inches (15 cm) by putting blocks under the legs. Sleeping with more pillows does not effectively relieve heartburn because it only changes the position of your head. °· Keep all follow-up visits as told by your health care provider. This is important. This includes any further testing if your chest pain does not go away. °Contact a health care provider if: °· Your chest pain does not go away. °· You have a rash with blisters on your chest. °· You have a fever. °· You have chills. °Get help right away if: °· Your chest pain is worse. °· You have a cough that gets worse, or you cough up blood. °· You have severe pain in your abdomen. °· You have severe weakness. °· You faint. °· You have sudden, unexplained chest discomfort. °· You have sudden, unexplained discomfort in your arms, back, neck, or jaw. °· You have shortness of breath at any time. °· You suddenly start to sweat, or your skin gets clammy. °· You feel nauseous or you vomit. °· You suddenly feel light-headed or dizzy. °· Your heart begins to beat  quickly, or it feels like it is skipping beats. °These symptoms may represent a serious problem that is an emergency. Do not wait to see if the symptoms will go away. Get medical help right away. Call your local emergency services (911 in the [place] Do not drive yourself to the hospital. °This information is not intended to replace advice given to you by your health care provider. Make sure you discuss any questions you have with your health care provider. °Document Released: 08/17/2005 Document Revised: 08/01/2016 Document Reviewed: 08/01/2016 °[employer]vier Interactive Patient Education © 2017 [employer] ° °

## 2018-05-10 NOTE — Assessment & Plan Note (Signed)
Poorly controlled will alter medications, encouraged DASH diet, minimize caffeine and obtain adequate sleep. Report concerning symptoms and follow up as directed and as needed 

## 2018-05-10 NOTE — Assessment & Plan Note (Signed)
Encouraged heart healthy diet, increase exercise, avoid trans fats, consider a krill oil cap daily 

## 2018-05-10 NOTE — Progress Notes (Signed)
Subjective:  I acted as a Neurosurgeon for [employer]. Fuller Song, RMA   Patient ID: Erin Acosta, female    DOB: 11/12/1951, 67 y.o.   MRN: [id number]  Chief Complaint  Patient presents with  . medication follow up    HPI  Patient is in today for medication follow up.  She also c/o chest pain that lasts only about 5 minutes.  She has been under a lot of stress lately.  No sob.  Patient Care Team: Zola Button, Grayling Congress, DO as PCP - General (Family Medicine) Malva Cogan, MD as Referring Physician (Dermatology) Blondell Reveal, MD (Obstetrics and Gynecology) Beverely Pace, Osborn Coho, MD as Referring Physician (Cardiology) Jodi Geralds, MD as Consulting Physician (Plastic Surgery)   Past Medical History:  Diagnosis Date  . Allergy   . Anxiety   . Depression   . Genital herpes   . H/O cesarean section   . Hypertension   . Rotator cuff tear   . Scoliosis     Past Surgical History:  Procedure Laterality Date  . BILATERAL CARPAL TUNNEL RELEASE    . BLEPHAROPLASTY    . BREAST EXCISIONAL BIOPSY    . CESAREAN SECTION    . FOOT SURGERY Right   . mini face lift    . TRIGGER FINGER RELEASE    . tummy tuck      Family History  Problem Relation Age of Onset  . Arthritis Unknown   . Hyperlipidemia Unknown   . Heart disease Unknown   . Stroke Unknown   . Hypertension Unknown     Social History   Socioeconomic History  . Marital status: Widowed    Spouse name: Not on file  . Number of children: Not on file  . Years of education: Not on file  . Highest education level: Not on file  Occupational History  . Not on file  Social Needs  . Financial resource strain: Not on file  . Food insecurity:    Worry: Not on file    Inability: Not on file  . Transportation needs:    Medical: Not on file    Non-medical: Not on file  Tobacco Use  . Smoking status: Never Smoker  . Smokeless tobacco: Never Used  Substance and Sexual Activity  . Alcohol use: Yes   Alcohol/week: 0.6 oz    Types: 1 Standard drinks or equivalent per week    Comment: Wine in the evening  . Drug use: No  . Sexual activity: Not on file  Lifestyle  . Physical activity:    Days per week: Not on file    Minutes per session: Not on file  . Stress: Not on file  Relationships  . Social connections:    Talks on phone: Not on file    Gets together: Not on file    Attends religious service: Not on file    Active member of club or organization: Not on file    Attends meetings of clubs or organizations: Not on file    Relationship status: Not on file  . Intimate partner violence:    Fear of current or ex partner: Not on file    Emotionally abused: Not on file    Physically abused: Not on file    Forced sexual activity: Not on file  Other Topics Concern  . Not on file  Social History Narrative  . Not on file    Outpatient Medications Prior to Visit  Medication Sig Dispense  Refill  . aspirin EC 81 MG tablet Take 81 mg by mouth daily.    . clonazePAM (KLONOPIN) 1 MG tablet Take 1 tablet (1 mg total) by mouth daily as needed. 90 tablet 0  . Collagenase POWD by Does not apply route daily.    . montelukast (SINGULAIR) 10 MG tablet Take 1 tablet (10 mg total) by mouth daily.    . Probiotic Product (PROBIOTIC DAILY PO) Take by mouth.    . psyllium (KONSYL) 33 % POWD Take by mouth daily.    . valACYclovir (VALTREX) 500 MG tablet Take 1 tablet (500 mg total) by mouth daily. 90 tablet 1  . Whey Protein Concentrate POWD Take by mouth daily.    Marland Kitchen escitalopram (LEXAPRO) 10 MG tablet Take 1 tablet (10 mg total) by mouth daily. 90 tablet 3  . lisinopril (PRINIVIL,ZESTRIL) 5 MG tablet Take 1 tablet (5 mg total) by mouth daily.  2   No facility-administered medications prior to visit.     No Known Allergies  Review of Systems  Constitutional: Negative for chills, fever and malaise/fatigue.  HENT: Negative for congestion and hearing loss.   Eyes: Negative for discharge.    Respiratory: Negative for cough, sputum production and shortness of breath.   Cardiovascular: Positive for chest pain. Negative for palpitations and leg swelling.  Gastrointestinal: Negative for abdominal pain, blood in stool, constipation, diarrhea, heartburn, nausea and vomiting.  Genitourinary: Negative for dysuria, frequency, hematuria and urgency.  Musculoskeletal: Negative for back pain, falls and myalgias.  Skin: Negative for rash.  Neurological: Negative for dizziness, sensory change, loss of consciousness, weakness and headaches.  Endo/Heme/Allergies: Negative for environmental allergies. Does not bruise/bleed easily.  Psychiatric/Behavioral: Negative for depression and suicidal ideas. The patient is not nervous/anxious and does not have insomnia.        Objective:    Physical Exam  Constitutional: She is oriented to person, place, and time. She appears well-developed and well-nourished.  HENT:  Head: Normocephalic and atraumatic.  Eyes: Conjunctivae and EOM are normal.  Neck: Normal range of motion. Neck supple. No JVD present. Carotid bruit is not present. No thyromegaly present.  Cardiovascular: Normal rate, regular rhythm and normal heart sounds.  No murmur heard. Pulmonary/Chest: Effort normal and breath sounds normal. No respiratory distress. She has no wheezes. She has no rales. She exhibits no tenderness.  Musculoskeletal: She exhibits no edema.  Neurological: She is alert and oriented to person, place, and time.  Psychiatric: She has a normal mood and affect.  Nursing note and vitals reviewed.   BP (!) 150/76 (BP Location: Left Arm, Patient Position: Sitting, Cuff Size: Normal)   Pulse 73   Temp 98 F (36.7 C) (Oral)   Resp 18   Wt 147 lb 12.8 oz (67 kg)   SpO2 100%   BMI 28.87 kg/m  Wt Readings from Last 3 Encounters:  05/10/18 147 lb 12.8 oz (67 kg)  08/24/17 137 lb (62.1 kg)  08/24/17 137 lb 9.6 oz (62.4 kg)   BP Readings from Last 3 Encounters:   05/10/18 (!) 150/76  08/24/17 108/69  08/24/17 (!) 102/56     Immunization History  Administered Date(s) Administered  . Influenza-Unspecified 08/05/2016, 09/18/2017  . Pneumococcal Conjugate-13 06/02/2016  . Zoster 11/21/2013  . Zoster Recombinat (Shingrix) 02/16/2017, 05/19/2017    Health Maintenance  Topic Date Due  . Janet Berlin  02/12/1970  . COLONOSCOPY  02/12/2001  . DEXA SCAN  02/13/2016  . PNA vac Low Risk Adult (2 of 2 -  PPSV23) 06/02/2017  . INFLUENZA VACCINE  06/21/2018  . MAMMOGRAM  06/06/2019  . Hepatitis C Screening  Completed    Lab Results  Component Value Date   WBC 7.5 08/24/2017   HGB 15.1 (H) 08/24/2017   HCT 45.1 08/24/2017   PLT 268.0 08/24/2017   GLUCOSE 102 (H) 08/24/2017   CHOL 224 (H) 08/24/2017   TRIG 166.0 (H) 08/24/2017   HDL 49.60 08/24/2017   LDLDIRECT 153.0 06/06/2016   LDLCALC 141 (H) 08/24/2017   ALT 13 08/24/2017   AST 12 08/24/2017   NA 139 08/24/2017   K 4.5 08/24/2017   CL 104 08/24/2017   CREATININE 0.64 08/24/2017   BUN 13 08/24/2017   CO2 28 08/24/2017   TSH 1.43 08/24/2017    Lab Results  Component Value Date   TSH 1.43 08/24/2017   Lab Results  Component Value Date   WBC 7.5 08/24/2017   HGB 15.1 (H) 08/24/2017   HCT 45.1 08/24/2017   MCV 91.3 08/24/2017   PLT 268.0 08/24/2017   Lab Results  Component Value Date   NA 139 08/24/2017   K 4.5 08/24/2017   CO2 28 08/24/2017   GLUCOSE 102 (H) 08/24/2017   BUN 13 08/24/2017   CREATININE 0.64 08/24/2017   BILITOT 0.5 08/24/2017   ALKPHOS 67 08/24/2017   AST 12 08/24/2017   ALT 13 08/24/2017   PROT 6.8 08/24/2017   ALBUMIN 4.0 08/24/2017   CALCIUM 9.4 08/24/2017   GFR 98.52 08/24/2017   Lab Results  Component Value Date   CHOL 224 (H) 08/24/2017   Lab Results  Component Value Date   HDL 49.60 08/24/2017   Lab Results  Component Value Date   LDLCALC 141 (H) 08/24/2017   Lab Results  Component Value Date   TRIG 166.0 (H) 08/24/2017   Lab  Results  Component Value Date   CHOLHDL 5 08/24/2017   No results found for: HGBA1C    EKG---  NSR    Assessment & Plan:   Problem List Items Addressed This Visit      Unprioritized   Chest pain   Relevant Orders   EKG 12-Lead (Completed)   ECHOCARDIOGRAM COMPLETE   Essential hypertension - Primary    Poorly controlled will alter medications, encouraged DASH diet, minimize caffeine and obtain adequate sleep. Report concerning symptoms and follow up as directed and as needed      Relevant Medications   lisinopril (PRINIVIL,ZESTRIL) 5 MG tablet   Other Relevant Orders   Lipid panel   Comprehensive metabolic panel   Hyperlipidemia    Encouraged heart healthy diet, increase exercise, avoid trans fats, consider a krill oil cap daily      Relevant Medications   lisinopril (PRINIVIL,ZESTRIL) 5 MG tablet    Other Visit Diagnoses    SOB (shortness of breath)       Relevant Orders   ECHOCARDIOGRAM COMPLETE   Anxiety and depression       Relevant Medications   FLUoxetine (PROZAC) 20 MG tablet      I have discontinued Alease Frame. Ridener's escitalopram. I am also having her start on FLUoxetine. Additionally, I am having her maintain her aspirin EC, montelukast, Probiotic Product (PROBIOTIC DAILY PO), Collagenase, psyllium, Whey Protein Concentrate, valACYclovir, clonazePAM, and lisinopril.  Meds ordered this encounter  Medications  . DISCONTD: lisinopril (PRINIVIL,ZESTRIL) 5 MG tablet    Sig: Take 1 tablet (5 mg total) by mouth daily.    Dispense:  10 tablet    Refill:  0  . lisinopril (PRINIVIL,ZESTRIL) 5 MG tablet    Sig: Take 1 tablet (5 mg total) by mouth daily.    Dispense:  90 tablet    Refill:  1  . FLUoxetine (PROZAC) 20 MG tablet    Sig: Take 1 tablet (20 mg total) by mouth daily.    Dispense:  30 tablet    Refill:  3    CMA served as scribe during this visit. History, Physical and Plan performed by medical provider. Documentation and orders reviewed and  attested to.  Donato SchultzYvonne R Lowne Chase, DO

## 2018-05-11 ENCOUNTER — Telehealth: Payer: Self-pay | Admitting: *Deleted

## 2018-05-11 LAB — LIPID PANEL
CHOL/HDL RATIO: 4
Cholesterol: 227 mg/dL — ABNORMAL HIGH (ref 0–200)
HDL: 58.1 mg/dL (ref >39.00)
LDL Cholesterol: 148 mg/dL — ABNORMAL HIGH (ref 0–99)
NONHDL: 168.76
Triglycerides: 104 mg/dL (ref 0.0–149.0)
VLDL: 20.8 mg/dL (ref 0.0–40.0)

## 2018-05-11 LAB — COMPREHENSIVE METABOLIC PANEL
ALT: 16 U/L (ref 0–35)
AST: 16 U/L (ref 0–37)
Albumin: 4.3 g/dL (ref 3.5–5.2)
Alkaline Phosphatase: 76 U/L (ref 39–117)
BUN: 12 mg/dL (ref 6–23)
CO2: 28 meq/L (ref 19–32)
Calcium: 9.2 mg/dL (ref 8.4–10.5)
Chloride: 105 mEq/L (ref 96–112)
Creatinine, Ser: 0.61 mg/dL (ref 0.40–1.20)
GFR: 103.91 mL/min (ref >60.00)
GLUCOSE: 95 mg/dL (ref 70–99)
POTASSIUM: 4.3 meq/L (ref 3.5–5.1)
SODIUM: 141 meq/L (ref 135–145)
Total Bilirubin: 0.4 mg/dL (ref 0.2–1.2)
Total Protein: 6.6 g/dL (ref 6.0–8.3)

## 2018-05-11 MED ORDER — FLUOXETINE HCL 20 MG PO CAPS: 20.0000 mg | ORAL_CAPSULE | Freq: Every day | ORAL | 0 refills | Status: DC

## 2018-05-11 NOTE — Telephone Encounter (Signed)
Patient notified and rx sent. 

## 2018-05-11 NOTE — Telephone Encounter (Signed)
Ok to send 10 day supply Ok to wait until July 10 but if symptoms worsen -- go to ER

## 2018-05-11 NOTE — Telephone Encounter (Signed)
Copied from CRM [phone]. Topic: Referral - Question >> May 11, 2018 10:20 AM Saverio DankerWeikart, Melissa J wrote: Reason for CRM:  Pt is calling to let us know that she can not get into cardiologist until July 10 and wants to know if it is ok to wait until then to get echo. Please call pt back at [phone]

## 2018-05-11 NOTE — Telephone Encounter (Signed)
Copied from CRM [id number]. Topic: General - Other >> May 10, 2018  4:43 PM Mcneil, Ja-Kwan wrote: Reason for CRM: Pt states the Rx for FLUoxetine (PROZAC) 20 MG tablet was suppose to be a 10 day supply [facility] to [facility] [place] [facility] [address] - [place], [place] - [address] [phone] (Phone) [phone] (Fax) and a 90 day supply [facility] [place] - [address]  [phone] (Phone)   [phone] (Fax). Pt states the [facility] a [facility] but a 90 day supply at [facility] is $7. Pt request Rx be changed to 10 day supply until the 90 day supply is sent and comes in from [facility]. Cb# [id number]

## 2018-05-16 ENCOUNTER — Telehealth: Payer: Self-pay | Admitting: *Deleted

## 2018-05-16 NOTE — Telephone Encounter (Signed)
Notes recorded by Alvira PhilipsWilliams, Shaneka, RMA on 05/16/2018 at 11:40 AM EDT Pt has been notified of lab results.

## 2018-05-16 NOTE — Telephone Encounter (Signed)
Copied from CRM [id number]. Topic: Inquiry >> May 15, 2018  1:21 PM Yvonna Alanisobinson, Andra M wrote: Reason for CRM: Patient called requesting her lab results. Please call the patient back today.       Thank You!!!

## 2018-05-23 ENCOUNTER — Ambulatory Visit (INDEPENDENT_AMBULATORY_CARE_PROVIDER_SITE_OTHER): Payer: Medicare Other | Admitting: Medical

## 2018-05-23 DIAGNOSIS — I10 Essential (primary) hypertension: Secondary | ICD-10-CM | POA: Diagnosis not present

## 2018-05-23 NOTE — Progress Notes (Addendum)
Pre visit review using our clinic review tool, if applicable. No additional management support is needed unless otherwise documented below in the visit note.   Pt here for Blood pressure check per Dr. Zola ButtonLowne-Chase  Pt currently takes:Lisinopril 5 mg daily, just started back taking medication for BP at ov with pcp on 05/10/18. Patient states she actually feels better since restarting this medication.   Pt reports compliance with medication.  BP today @ =119/76 HR =67  BP Readings from Last 3 Encounters:  05/10/18 (!) 150/76  08/24/17 108/69  08/24/17 (!) 102/56    Pt advised per Esperanza RichtersEdward Saguier DOD, continue current regimen. Follow up with pcp in 3 months.Patient agreed with advice.   Agree with above as we discussed.  Esperanza RichtersEdward Saguier, PA-C

## 2018-05-29 DIAGNOSIS — R0789 Other chest pain: Secondary | ICD-10-CM | POA: Diagnosis not present

## 2018-05-29 DIAGNOSIS — R0609 Other forms of dyspnea: Secondary | ICD-10-CM | POA: Diagnosis not present

## 2018-05-29 DIAGNOSIS — E785 Hyperlipidemia, unspecified: Secondary | ICD-10-CM | POA: Diagnosis not present

## 2018-05-29 DIAGNOSIS — Z8249 Family history of ischemic heart disease and other diseases of the circulatory system: Secondary | ICD-10-CM | POA: Diagnosis not present

## 2018-05-29 DIAGNOSIS — I10 Essential (primary) hypertension: Secondary | ICD-10-CM | POA: Diagnosis not present

## 2018-05-30 DIAGNOSIS — R079 Chest pain, unspecified: Secondary | ICD-10-CM | POA: Diagnosis not present

## 2018-05-31 DIAGNOSIS — M25731 Osteophyte, right wrist: Secondary | ICD-10-CM | POA: Diagnosis not present

## 2018-05-31 DIAGNOSIS — M25531 Pain in right wrist: Secondary | ICD-10-CM | POA: Diagnosis not present

## 2018-06-05 ENCOUNTER — Ambulatory Visit (HOSPITAL_BASED_OUTPATIENT_CLINIC_OR_DEPARTMENT_OTHER): Payer: Medicare Other

## 2018-06-05 ENCOUNTER — Ambulatory Visit (HOSPITAL_BASED_OUTPATIENT_CLINIC_OR_DEPARTMENT_OTHER)
Admission: RE | Admit: 2018-06-05 | Discharge: 2018-06-05 | Disposition: A | Discharge status: Home / Self Care | Payer: Medicare Other | Source: Ambulatory Visit | Attending: Family Medicine | Admitting: Family Medicine

## 2018-06-05 ENCOUNTER — Encounter (HOSPITAL_BASED_OUTPATIENT_CLINIC_OR_DEPARTMENT_OTHER): Payer: Self-pay

## 2018-06-05 DIAGNOSIS — Z1231 Encounter for screening mammogram for malignant neoplasm of breast: Secondary | ICD-10-CM | POA: Insufficient documentation

## 2018-06-06 ENCOUNTER — Ambulatory Visit (HOSPITAL_BASED_OUTPATIENT_CLINIC_OR_DEPARTMENT_OTHER): Payer: Medicare Other

## 2018-06-13 DIAGNOSIS — M25541 Pain in joints of right hand: Secondary | ICD-10-CM | POA: Diagnosis not present

## 2018-06-13 DIAGNOSIS — M25531 Pain in right wrist: Secondary | ICD-10-CM | POA: Diagnosis not present

## 2018-06-13 DIAGNOSIS — M19031 Primary osteoarthritis, right wrist: Secondary | ICD-10-CM | POA: Diagnosis not present

## 2018-06-14 DIAGNOSIS — R079 Chest pain, unspecified: Secondary | ICD-10-CM | POA: Diagnosis not present

## 2018-06-26 DIAGNOSIS — R0609 Other forms of dyspnea: Secondary | ICD-10-CM | POA: Diagnosis not present

## 2018-06-26 DIAGNOSIS — I10 Essential (primary) hypertension: Secondary | ICD-10-CM | POA: Diagnosis not present

## 2018-06-26 DIAGNOSIS — I208 Other forms of angina pectoris: Secondary | ICD-10-CM | POA: Diagnosis not present

## 2018-06-26 DIAGNOSIS — E789 Disorder of lipoprotein metabolism, unspecified: Secondary | ICD-10-CM | POA: Diagnosis not present

## 2018-06-26 DIAGNOSIS — R0789 Other chest pain: Secondary | ICD-10-CM | POA: Diagnosis not present

## 2018-06-26 DIAGNOSIS — R9439 Abnormal result of other cardiovascular function study: Secondary | ICD-10-CM | POA: Diagnosis not present

## 2018-06-28 DIAGNOSIS — M654 Radial styloid tenosynovitis [de Quervain]: Secondary | ICD-10-CM | POA: Diagnosis not present

## 2018-07-02 ENCOUNTER — Telehealth: Payer: Self-pay

## 2018-07-02 DIAGNOSIS — L709 Acne, unspecified: Secondary | ICD-10-CM

## 2018-07-02 NOTE — Telephone Encounter (Signed)
Copied from CRM [id number]. Topic: Referral - Request >> Jul 02, 2018  2:38 PM Arlyss Gandyichardson, Taren N, NT wrote: Reason for CRM: Pt would like a referral to a dermatologist for adult acne. She would [facility], Dr. Oren BeckmannJames Sfzabo. Fax#: [phone]

## 2018-07-03 DIAGNOSIS — R0602 Shortness of breath: Secondary | ICD-10-CM | POA: Diagnosis not present

## 2018-07-03 DIAGNOSIS — I2510 Atherosclerotic heart disease of native coronary artery without angina pectoris: Secondary | ICD-10-CM | POA: Diagnosis not present

## 2018-07-03 DIAGNOSIS — I2582 Chronic total occlusion of coronary artery: Secondary | ICD-10-CM | POA: Diagnosis not present

## 2018-07-03 DIAGNOSIS — R079 Chest pain, unspecified: Secondary | ICD-10-CM | POA: Diagnosis not present

## 2018-07-04 DIAGNOSIS — Z955 Presence of coronary angioplasty implant and graft: Secondary | ICD-10-CM | POA: Diagnosis not present

## 2018-07-04 DIAGNOSIS — I2510 Atherosclerotic heart disease of native coronary artery without angina pectoris: Secondary | ICD-10-CM | POA: Diagnosis not present

## 2018-07-18 DIAGNOSIS — M654 Radial styloid tenosynovitis [de Quervain]: Secondary | ICD-10-CM | POA: Diagnosis not present

## 2018-07-19 ENCOUNTER — Other Ambulatory Visit: Payer: Self-pay | Admitting: Family Medicine

## 2018-08-01 DIAGNOSIS — L705 Acne excoriee des jeunes filles: Secondary | ICD-10-CM | POA: Diagnosis not present

## 2018-08-02 DIAGNOSIS — Z79899 Other long term (current) drug therapy: Secondary | ICD-10-CM | POA: Diagnosis not present

## 2018-08-02 DIAGNOSIS — Z01419 Encounter for gynecological examination (general) (routine) without abnormal findings: Secondary | ICD-10-CM | POA: Diagnosis not present

## 2018-08-02 DIAGNOSIS — N952 Postmenopausal atrophic vaginitis: Secondary | ICD-10-CM | POA: Diagnosis not present

## 2018-08-02 DIAGNOSIS — E785 Hyperlipidemia, unspecified: Secondary | ICD-10-CM | POA: Diagnosis not present

## 2018-08-07 DIAGNOSIS — F411 Generalized anxiety disorder: Secondary | ICD-10-CM | POA: Diagnosis not present

## 2018-08-07 DIAGNOSIS — R918 Other nonspecific abnormal finding of lung field: Secondary | ICD-10-CM | POA: Diagnosis not present

## 2018-08-07 DIAGNOSIS — R0609 Other forms of dyspnea: Secondary | ICD-10-CM | POA: Diagnosis not present

## 2018-08-14 DIAGNOSIS — Z955 Presence of coronary angioplasty implant and graft: Secondary | ICD-10-CM | POA: Diagnosis not present

## 2018-08-15 DIAGNOSIS — J181 Lobar pneumonia, unspecified organism: Secondary | ICD-10-CM | POA: Diagnosis not present

## 2018-08-15 DIAGNOSIS — E789 Disorder of lipoprotein metabolism, unspecified: Secondary | ICD-10-CM | POA: Diagnosis not present

## 2018-08-15 DIAGNOSIS — I771 Stricture of artery: Secondary | ICD-10-CM | POA: Diagnosis not present

## 2018-08-15 DIAGNOSIS — R918 Other nonspecific abnormal finding of lung field: Secondary | ICD-10-CM | POA: Diagnosis not present

## 2018-09-06 DIAGNOSIS — L700 Acne vulgaris: Secondary | ICD-10-CM | POA: Diagnosis not present

## 2018-09-06 DIAGNOSIS — Z79899 Other long term (current) drug therapy: Secondary | ICD-10-CM | POA: Diagnosis not present

## 2018-09-18 DIAGNOSIS — F411 Generalized anxiety disorder: Secondary | ICD-10-CM | POA: Diagnosis not present

## 2018-09-18 DIAGNOSIS — R0609 Other forms of dyspnea: Secondary | ICD-10-CM | POA: Diagnosis not present

## 2018-09-18 DIAGNOSIS — R918 Other nonspecific abnormal finding of lung field: Secondary | ICD-10-CM | POA: Diagnosis not present

## 2018-09-19 ENCOUNTER — Other Ambulatory Visit (HOSPITAL_BASED_OUTPATIENT_CLINIC_OR_DEPARTMENT_OTHER): Payer: Medicare Other

## 2018-10-08 DIAGNOSIS — J302 Other seasonal allergic rhinitis: Secondary | ICD-10-CM | POA: Diagnosis not present

## 2018-10-09 DIAGNOSIS — B373 Candidiasis of vulva and vagina: Secondary | ICD-10-CM | POA: Diagnosis not present

## 2018-11-05 ENCOUNTER — Encounter: Payer: Self-pay | Admitting: Family Medicine

## 2018-11-05 ENCOUNTER — Ambulatory Visit (INDEPENDENT_AMBULATORY_CARE_PROVIDER_SITE_OTHER): Payer: Medicare Other | Admitting: Family Medicine

## 2018-11-05 VITALS — BP 130/78 | HR 74 | Temp 98.1°F | Resp 16 | Ht 60.0 in | Wt 144.2 lb

## 2018-11-05 DIAGNOSIS — T7840XA Allergy, unspecified, initial encounter: Secondary | ICD-10-CM

## 2018-11-05 DIAGNOSIS — E785 Hyperlipidemia, unspecified: Secondary | ICD-10-CM | POA: Diagnosis not present

## 2018-11-05 DIAGNOSIS — I10 Essential (primary) hypertension: Secondary | ICD-10-CM | POA: Diagnosis not present

## 2018-11-05 LAB — LIPID PANEL
Cholesterol: 155 mg/dL (ref 0–200)
HDL: 49.6 mg/dL (ref >39.00)
LDL CALC: 68 mg/dL (ref 0–99)
NonHDL: 105.89
Total CHOL/HDL Ratio: 3
Triglycerides: 190 mg/dL — ABNORMAL HIGH (ref 0.0–149.0)
VLDL: 38 mg/dL (ref 0.0–40.0)

## 2018-11-05 LAB — COMPREHENSIVE METABOLIC PANEL
ALBUMIN: 4.4 g/dL (ref 3.5–5.2)
ALT: 23 U/L (ref 0–35)
AST: 16 U/L (ref 0–37)
Alkaline Phosphatase: 78 U/L (ref 39–117)
BUN: 11 mg/dL (ref 6–23)
CO2: 28 mEq/L (ref 19–32)
Calcium: 9.6 mg/dL (ref 8.4–10.5)
Chloride: 102 mEq/L (ref 96–112)
Creatinine, Ser: 0.52 mg/dL (ref 0.40–1.20)
GFR: 124.74 mL/min (ref >60.00)
Glucose, Bld: 96 mg/dL (ref 70–99)
Potassium: 4.2 mEq/L (ref 3.5–5.1)
Sodium: 138 mEq/L (ref 135–145)
Total Bilirubin: 0.5 mg/dL (ref 0.2–1.2)
Total Protein: 6.7 g/dL (ref 6.0–8.3)

## 2018-11-05 MED ORDER — FLUTICASONE PROPIONATE 50 MCG/ACT NA SUSP: 2.0000 | Freq: Every day | NASAL | 6 refills | Status: DC

## 2018-11-05 MED ORDER — CETIRIZINE HCL 10 MG PO TABS: 10.0000 mg | ORAL_TABLET | Freq: Every day | ORAL | 11 refills | Status: DC

## 2018-11-05 MED ORDER — MONTELUKAST SODIUM 10 MG PO TABS: 10.0000 mg | ORAL_TABLET | Freq: Every day | ORAL | 3 refills | Status: DC

## 2018-11-05 NOTE — Patient Instructions (Signed)

## 2018-11-05 NOTE — Assessment & Plan Note (Signed)
Stable con't meds 

## 2018-11-05 NOTE — Assessment & Plan Note (Signed)
Zyrtec, flonase and retry singulair  She may need to see the allergist

## 2018-11-05 NOTE — Assessment & Plan Note (Signed)
Encouraged heart healthy diet, increase exercise, avoid trans fats, consider a krill oil cap daily 

## 2018-11-05 NOTE — Progress Notes (Signed)
Patient ID: Erin Acosta, female    DOB: January 02, 1951  Age: 67 y.o. MRN: [id number]    Subjective:  Subjective  HPI Erin Acosta presents for allergies.  She c/o scratchy throat , runny nose   Review of Systems  Constitutional: Negative for appetite change, chills, diaphoresis, fatigue, fever and unexpected weight change.  HENT: Negative for congestion and hearing loss.   Eyes: Negative for pain, discharge, redness and visual disturbance.  Respiratory: Negative for cough, chest tightness, shortness of breath and wheezing.   Cardiovascular: Negative for chest pain, palpitations and leg swelling.  Gastrointestinal: Negative for abdominal pain, blood in stool, constipation, diarrhea, nausea and vomiting.  Endocrine: Negative for cold intolerance, heat intolerance, polydipsia, polyphagia and polyuria.  Genitourinary: Negative for difficulty urinating, dysuria, frequency, hematuria and urgency.  Musculoskeletal: Negative for back pain and myalgias.  Skin: Negative for rash.  Allergic/Immunologic: Negative for environmental allergies.  Neurological: Negative for dizziness, weakness, light-headedness, numbness and headaches.  Hematological: Does not bruise/bleed easily.  Psychiatric/Behavioral: Negative for suicidal ideas. The patient is not nervous/anxious.     History Past Medical History:  Diagnosis Date  . Allergy   . Anxiety   . Depression   . Genital herpes   . H/O cesarean section   . Hypertension   . Rotator cuff tear   . Scoliosis     She has a past surgical history that includes Bilateral carpal tunnel release; Trigger finger release; Foot surgery (Right); Cesarean section; tummy tuck; mini face lift; Blepharoplasty; and Breast excisional biopsy.   Her family history includes Arthritis in her unknown relative; Heart disease in her unknown relative; Hyperlipidemia in her unknown relative; Hypertension in her unknown relative; Stroke in her unknown relative.She reports  that she has never smoked. She has never used smokeless tobacco. She reports current alcohol use of about 1.0 standard drinks of alcohol per week. She reports that she does not use drugs.  Current Outpatient Medications on File Prior to Visit  Medication Sig Dispense Refill  . aspirin EC 81 MG tablet Take 81 mg by mouth daily.    Marland Kitchen. atorvastatin (LIPITOR) 20 MG tablet Take 1 tablet by mouth daily.    . clonazePAM (KLONOPIN) 1 MG tablet Take 1 tablet (1 mg total) by mouth daily as needed. 90 tablet 0  . FLUoxetine (PROZAC) 20 MG capsule TAKE 1 CAPSULE DAILY 90 capsule 1  . Fluticasone-Salmeterol (ADVAIR) 250-50 MCG/DOSE AEPB Inhale 1 puff into the lungs every 12 (twelve) hours.    . ISOtretinoin (ACCUTANE) 40 MG capsule Take 1 capsule by mouth 2 (two) times daily.    Marland Kitchen. lisinopril (PRINIVIL,ZESTRIL) 5 MG tablet Take 1 tablet (5 mg total) by mouth daily. 90 tablet 1  . Olopatadine HCl 0.7 % SOLN Apply to eye.    . prasugrel (EFFIENT) 10 MG TABS tablet Take 1 tablet by mouth daily.    . Probiotic Product (PROBIOTIC DAILY PO) Take by mouth.    . valACYclovir (VALTREX) 500 MG tablet Take 1 tablet (500 mg total) by mouth daily. 90 tablet 1   No current facility-administered medications on file prior to visit.      Objective:  Objective  Physical Exam Vitals signs and nursing note reviewed.  Constitutional:      Appearance: She is well-developed.  HENT:     Head: Normocephalic and atraumatic.  Eyes:     Conjunctiva/sclera: Conjunctivae normal.  Neck:     Musculoskeletal: Normal range of motion and neck supple.  Thyroid: No thyromegaly.     Vascular: No carotid bruit or JVD.  Cardiovascular:     Rate and Rhythm: Normal rate and regular rhythm.     Heart sounds: Normal heart sounds. No murmur.  Pulmonary:     Effort: Pulmonary effort is normal. No respiratory distress.     Breath sounds: Normal breath sounds. No wheezing or rales.  Chest:     Chest wall: No tenderness.  Neurological:      Mental Status: She is alert and oriented to person, place, and time.    BP 130/78 (BP Location: Right Arm, Cuff Size: Normal)   Pulse 74   Temp 98.1 F (36.7 C) (Oral)   Resp 16   Ht 5' (1.524 m)   Wt 144 lb 3.2 oz (65.4 kg)   SpO2 98%   BMI 28.16 kg/m  Wt Readings from Last 3 Encounters:  11/05/18 144 lb 3.2 oz (65.4 kg)  05/10/18 147 lb 12.8 oz (67 kg)  08/24/17 137 lb (62.1 kg)     Lab Results  Component Value Date   WBC 7.5 08/24/2017   HGB 15.1 (H) 08/24/2017   HCT 45.1 08/24/2017   PLT 268.0 08/24/2017   GLUCOSE 96 11/05/2018   CHOL 155 11/05/2018   TRIG 190.0 (H) 11/05/2018   HDL 49.60 11/05/2018   LDLDIRECT 153.0 06/06/2016   LDLCALC 68 11/05/2018   ALT 23 11/05/2018   AST 16 11/05/2018   NA 138 11/05/2018   K 4.2 11/05/2018   CL 102 11/05/2018   CREATININE 0.52 11/05/2018   BUN 11 11/05/2018   CO2 28 11/05/2018   TSH 1.43 08/24/2017    Mm 3d Screen Breast Bilateral  Result Date: 06/05/2018 CLINICAL DATA:  Screening. EXAM: DIGITAL SCREENING BILATERAL MAMMOGRAM WITH TOMO AND CAD COMPARISON:  Previous exam(s). ACR Breast Density Category b: There are scattered areas of fibroglandular density. FINDINGS: There are no findings suspicious for malignancy. Images were processed with CAD. IMPRESSION: No mammographic evidence of malignancy. A result letter of this screening mammogram will be mailed directly to the patient. RECOMMENDATION: Screening mammogram in one year. (Code:SM-B-01Y) BI-RADS CATEGORY  1: Negative. Electronically Signed   By: Amie Portland M.D.   On: 06/05/2018 15:34     Assessment & Plan:  Plan  I have discontinued Erin Acosta. Erin Acosta's montelukast, Collagenase, psyllium, and Whey Protein Concentrate. I am also having her start on fluticasone, cetirizine, and montelukast. Additionally, I am having her maintain her aspirin EC, Probiotic Product (PROBIOTIC DAILY PO), valACYclovir, clonazePAM, lisinopril, FLUoxetine, atorvastatin,  Fluticasone-Salmeterol, Olopatadine HCl, ISOtretinoin, and prasugrel.  Meds ordered this encounter  Medications  . fluticasone (FLONASE) 50 MCG/ACT nasal spray    Sig: Place 2 sprays into both nostrils daily.    Dispense:  16 g    Refill:  6  . cetirizine (ZYRTEC) 10 MG tablet    Sig: Take 1 tablet (10 mg total) by mouth daily.    Dispense:  30 tablet    Refill:  11  . montelukast (SINGULAIR) 10 MG tablet    Sig: Take 1 tablet (10 mg total) by mouth at bedtime.    Dispense:  30 tablet    Refill:  3    Problem List Items Addressed This Visit      Unprioritized   Allergies - Primary    Zyrtec, flonase and retry singulair  She may need to see the allergist      Relevant Medications   fluticasone (FLONASE) 50 MCG/ACT nasal spray  cetirizine (ZYRTEC) 10 MG tablet   montelukast (SINGULAIR) 10 MG tablet   Essential hypertension    Stable con't meds      Relevant Medications   atorvastatin (LIPITOR) 20 MG tablet   Hyperlipidemia    Encouraged heart healthy diet, increase exercise, avoid trans fats, consider a krill oil cap daily      Relevant Medications   atorvastatin (LIPITOR) 20 MG tablet   Other Relevant Orders   Lipid panel (Completed)   Comprehensive metabolic panel (Completed)      Follow-up: Return in about 6 months (around 05/07/2019), or if symptoms worsen or fail to improve.  [name], DO

## 2018-11-13 ENCOUNTER — Other Ambulatory Visit: Payer: Self-pay | Admitting: Family Medicine

## 2018-11-13 ENCOUNTER — Encounter: Payer: Self-pay | Admitting: *Deleted

## 2018-11-13 DIAGNOSIS — E785 Hyperlipidemia, unspecified: Secondary | ICD-10-CM

## 2018-11-13 DIAGNOSIS — I10 Essential (primary) hypertension: Secondary | ICD-10-CM

## 2018-12-06 DIAGNOSIS — Z79899 Other long term (current) drug therapy: Secondary | ICD-10-CM | POA: Diagnosis not present

## 2018-12-06 DIAGNOSIS — L700 Acne vulgaris: Secondary | ICD-10-CM | POA: Diagnosis not present

## 2018-12-24 DIAGNOSIS — M25511 Pain in right shoulder: Secondary | ICD-10-CM | POA: Diagnosis not present

## 2018-12-24 DIAGNOSIS — M7541 Impingement syndrome of right shoulder: Secondary | ICD-10-CM | POA: Diagnosis not present

## 2018-12-24 DIAGNOSIS — M1711 Unilateral primary osteoarthritis, right knee: Secondary | ICD-10-CM | POA: Diagnosis not present

## 2018-12-31 ENCOUNTER — Telehealth: Payer: Self-pay | Admitting: Family Medicine

## 2018-12-31 NOTE — Telephone Encounter (Signed)
Copied from CRM [id number]. Topic: Quick Communication - Rx Refill/Question >> Dec 31, 2018  4:07 PM Maia Petties wrote: Medication: clonazePAM (KLONOPIN) 1 MG tablet - pt has 5 tablets - pt taking prn, she is stressed now for planning her daughter's wedding and using more frequently & her sister is in the hospital with blood auto-immune disease  Has the patient contacted their pharmacy?  No - states they always tell her to call the doctor Preferred Pharmacy (with phone number or street name): [facility] [address] [place], [place] - [address] [phone] (Phone)   [phone] (Fax)

## 2019-01-03 ENCOUNTER — Other Ambulatory Visit: Payer: Self-pay | Admitting: Family Medicine

## 2019-01-03 DIAGNOSIS — L700 Acne vulgaris: Secondary | ICD-10-CM | POA: Diagnosis not present

## 2019-01-03 DIAGNOSIS — L810 Postinflammatory hyperpigmentation: Secondary | ICD-10-CM | POA: Diagnosis not present

## 2019-01-03 DIAGNOSIS — F411 Generalized anxiety disorder: Secondary | ICD-10-CM

## 2019-01-03 MED ORDER — CLONAZEPAM 1 MG PO TABS: 1.0000 mg | ORAL_TABLET | Freq: Every day | ORAL | 0 refills | Status: DC | PRN

## 2019-01-03 NOTE — Telephone Encounter (Signed)
Sent in

## 2019-01-03 NOTE — Telephone Encounter (Signed)
Last written: 04/26/18 Last ov: 11/05/18 Next ov: none Contract: 05/05/19 UDS: 05/05/19

## 2019-01-04 ENCOUNTER — Other Ambulatory Visit: Payer: Self-pay | Admitting: Family Medicine

## 2019-01-04 DIAGNOSIS — F411 Generalized anxiety disorder: Secondary | ICD-10-CM

## 2019-01-04 MED ORDER — CLONAZEPAM 1 MG PO TABS: 1.0000 mg | ORAL_TABLET | Freq: Every day | ORAL | 0 refills | Status: DC | PRN

## 2019-01-04 NOTE — Telephone Encounter (Signed)
sent 

## 2019-01-04 NOTE — Telephone Encounter (Signed)
Patient would like to use [facility] pharmacy, can you please resend?

## 2019-01-09 DIAGNOSIS — M75101 Unspecified rotator cuff tear or rupture of right shoulder, not specified as traumatic: Secondary | ICD-10-CM | POA: Diagnosis not present

## 2019-01-09 DIAGNOSIS — M75111 Incomplete rotator cuff tear or rupture of right shoulder, not specified as traumatic: Secondary | ICD-10-CM | POA: Diagnosis not present

## 2019-01-09 DIAGNOSIS — M75121 Complete rotator cuff tear or rupture of right shoulder, not specified as traumatic: Secondary | ICD-10-CM | POA: Diagnosis not present

## 2019-01-09 DIAGNOSIS — M19011 Primary osteoarthritis, right shoulder: Secondary | ICD-10-CM | POA: Diagnosis not present

## 2019-01-11 ENCOUNTER — Other Ambulatory Visit: Payer: Self-pay | Admitting: *Deleted

## 2019-01-11 DIAGNOSIS — T7840XA Allergy, unspecified, initial encounter: Secondary | ICD-10-CM

## 2019-01-11 MED ORDER — CETIRIZINE HCL 10 MG PO TABS: 10.0000 mg | ORAL_TABLET | Freq: Every day | ORAL | 3 refills | Status: DC

## 2019-01-12 ENCOUNTER — Other Ambulatory Visit: Payer: Self-pay | Admitting: Family Medicine

## 2019-02-04 DIAGNOSIS — M7541 Impingement syndrome of right shoulder: Secondary | ICD-10-CM | POA: Diagnosis not present

## 2019-02-04 DIAGNOSIS — M19011 Primary osteoarthritis, right shoulder: Secondary | ICD-10-CM | POA: Diagnosis not present

## 2019-02-04 DIAGNOSIS — S46211D Strain of muscle, fascia and tendon of other parts of biceps, right arm, subsequent encounter: Secondary | ICD-10-CM | POA: Diagnosis not present

## 2019-02-04 DIAGNOSIS — M75121 Complete rotator cuff tear or rupture of right shoulder, not specified as traumatic: Secondary | ICD-10-CM | POA: Diagnosis not present

## 2019-02-12 ENCOUNTER — Other Ambulatory Visit: Payer: Self-pay | Admitting: Family Medicine

## 2019-03-13 DIAGNOSIS — Z79899 Other long term (current) drug therapy: Secondary | ICD-10-CM | POA: Diagnosis not present

## 2019-03-13 DIAGNOSIS — L700 Acne vulgaris: Secondary | ICD-10-CM | POA: Diagnosis not present

## 2019-05-13 DIAGNOSIS — R0600 Dyspnea, unspecified: Secondary | ICD-10-CM | POA: Diagnosis not present

## 2019-05-13 DIAGNOSIS — E782 Mixed hyperlipidemia: Secondary | ICD-10-CM | POA: Diagnosis not present

## 2019-05-13 DIAGNOSIS — Z7982 Long term (current) use of aspirin: Secondary | ICD-10-CM | POA: Diagnosis not present

## 2019-05-13 DIAGNOSIS — R079 Chest pain, unspecified: Secondary | ICD-10-CM | POA: Diagnosis not present

## 2019-05-13 DIAGNOSIS — I10 Essential (primary) hypertension: Secondary | ICD-10-CM | POA: Diagnosis not present

## 2019-05-13 DIAGNOSIS — F411 Generalized anxiety disorder: Secondary | ICD-10-CM | POA: Diagnosis not present

## 2019-05-13 DIAGNOSIS — I2510 Atherosclerotic heart disease of native coronary artery without angina pectoris: Secondary | ICD-10-CM | POA: Diagnosis not present

## 2019-05-14 DIAGNOSIS — R079 Chest pain, unspecified: Secondary | ICD-10-CM | POA: Diagnosis not present

## 2019-05-22 ENCOUNTER — Other Ambulatory Visit (HOSPITAL_BASED_OUTPATIENT_CLINIC_OR_DEPARTMENT_OTHER): Payer: Self-pay | Admitting: Family Medicine

## 2019-05-22 DIAGNOSIS — Z1231 Encounter for screening mammogram for malignant neoplasm of breast: Secondary | ICD-10-CM

## 2019-05-30 DIAGNOSIS — R079 Chest pain, unspecified: Secondary | ICD-10-CM | POA: Diagnosis not present

## 2019-05-30 DIAGNOSIS — R0602 Shortness of breath: Secondary | ICD-10-CM | POA: Diagnosis not present

## 2019-05-30 DIAGNOSIS — I2510 Atherosclerotic heart disease of native coronary artery without angina pectoris: Secondary | ICD-10-CM | POA: Diagnosis not present

## 2019-05-30 DIAGNOSIS — R0609 Other forms of dyspnea: Secondary | ICD-10-CM | POA: Diagnosis not present

## 2019-06-14 ENCOUNTER — Encounter: Payer: Self-pay | Admitting: Family Medicine

## 2019-06-14 ENCOUNTER — Other Ambulatory Visit: Payer: Self-pay

## 2019-06-14 ENCOUNTER — Ambulatory Visit (INDEPENDENT_AMBULATORY_CARE_PROVIDER_SITE_OTHER): Payer: Medicare Other | Admitting: Family Medicine

## 2019-06-14 DIAGNOSIS — F411 Generalized anxiety disorder: Secondary | ICD-10-CM

## 2019-06-14 MED ORDER — CLONAZEPAM 1 MG PO TABS: 1.0000 mg | ORAL_TABLET | Freq: Every day | ORAL | 0 refills | Status: DC | PRN

## 2019-06-14 MED ORDER — SERTRALINE HCL 50 MG PO TABS: 50.0000 mg | ORAL_TABLET | Freq: Every day | ORAL | 3 refills | Status: DC

## 2019-06-14 NOTE — Patient Instructions (Signed)

## 2019-06-14 NOTE — Progress Notes (Signed)
Patient ID: Erin Acosta, female    DOB: May 25, 1951  Age: 68 y.o. MRN: [id number]    Subjective:  Subjective  HPI Erin Acosta presents for anxiety problems.   She recently had a cath and stent-- and saw cardiology recently for sob.  They feel its her anxiety and told her to see [place].    Review of Systems  Constitutional: Negative for activity change, appetite change, fatigue and unexpected weight change.  Respiratory: Negative for cough and shortness of breath.   Cardiovascular: Negative for chest pain and palpitations.  Psychiatric/Behavioral: Negative for behavioral problems, dysphoric mood, self-injury, sleep disturbance and suicidal ideas. The patient is nervous/anxious.     History Past Medical History:  Diagnosis Date  . Allergy   . Anxiety   . Depression   . Genital herpes   . H/O cesarean section   . Hypertension   . Rotator cuff tear   . Scoliosis     She has a past surgical history that includes Bilateral carpal tunnel release; Trigger finger release; Foot surgery (Right); Cesarean section; tummy tuck; mini face lift; Blepharoplasty; and Breast excisional biopsy.   Her family history includes Arthritis in her unknown relative; Heart disease in her unknown relative; Hyperlipidemia in her unknown relative; Hypertension in her unknown relative; Stroke in her unknown relative.She reports that she has never smoked. She has never used smokeless tobacco. She reports current alcohol use of about 1.0 standard drinks of alcohol per week. She reports that she does not use drugs.  Current Outpatient Medications on File Prior to Visit  Medication Sig Dispense Refill  . aspirin EC 81 MG tablet Take 81 mg by mouth daily.    Marland Kitchen atorvastatin (LIPITOR) 20 MG tablet Take 1 tablet by mouth daily.    . cetirizine (ZYRTEC) 10 MG tablet Take 1 tablet (10 mg total) by mouth daily. 90 tablet 3  . fluticasone (FLONASE) 50 MCG/ACT nasal spray Place 2 sprays into both nostrils daily. 16 g 6  .  Fluticasone-Salmeterol (ADVAIR) 250-50 MCG/DOSE AEPB Inhale 1 puff into the lungs every 12 (twelve) hours.    . ISOtretinoin (ACCUTANE) 40 MG capsule Take 1 capsule by mouth 2 (two) times daily.    Marland Kitchen lisinopril (PRINIVIL,ZESTRIL) 5 MG tablet Take 1 tablet (5 mg total) by mouth daily. 90 tablet 1  . montelukast (SINGULAIR) 10 MG tablet Take 1 tablet (10 mg total) by mouth at bedtime. 30 tablet 3  . Olopatadine HCl 0.7 % SOLN Apply to eye.    . prasugrel (EFFIENT) 10 MG TABS tablet Take 1 tablet by mouth daily.    . Probiotic Product (PROBIOTIC DAILY PO) Take by mouth.    . valACYclovir (VALTREX) 500 MG tablet TAKE 1 TABLET DAILY 90 tablet 4   No current facility-administered medications on file prior to visit.      Objective:  Objective  Physical Exam Vitals signs and nursing note reviewed.  Constitutional:      Appearance: She is well-developed.  HENT:     Head: Normocephalic and atraumatic.  Eyes:     Conjunctiva/sclera: Conjunctivae normal.  Neck:     Musculoskeletal: Normal range of motion and neck supple.     Thyroid: No thyromegaly.     Vascular: No carotid bruit or JVD.  Cardiovascular:     Rate and Rhythm: Normal rate and regular rhythm.     Heart sounds: Normal heart sounds. No murmur.  Pulmonary:     Effort: Pulmonary effort is normal. No respiratory  distress.     Breath sounds: Normal breath sounds. No wheezing or rales.  Chest:     Chest wall: No tenderness.  Neurological:     Mental Status: She is alert and oriented to person, place, and time.  Psychiatric:        Attention and Perception: Attention normal.        Mood and Affect: Mood is anxious. Mood is not depressed. Affect is not labile, flat, angry, tearful or inappropriate.        Speech: Speech normal.        Behavior: Behavior normal.        Thought Content: Thought content normal.    BP 117/70 (BP Location: Left Arm, Patient Position: Sitting, Cuff Size: Normal)   Pulse 71   Temp 98.2 F (36.8 C)  (Oral)   Resp 18   Ht 5' (1.524 m)   Wt 141 lb (64 kg)   SpO2 100%   BMI 27.54 kg/m  Wt Readings from Last 3 Encounters:  06/14/19 141 lb (64 kg)  11/05/18 144 lb 3.2 oz (65.4 kg)  05/10/18 147 lb 12.8 oz (67 kg)     Lab Results  Component Value Date   WBC 7.5 08/24/2017   HGB 15.1 (H) 08/24/2017   HCT 45.1 08/24/2017   PLT 268.0 08/24/2017   GLUCOSE 96 11/05/2018   CHOL 155 11/05/2018   TRIG 190.0 (H) 11/05/2018   HDL 49.60 11/05/2018   LDLDIRECT 153.0 06/06/2016   LDLCALC 68 11/05/2018   ALT 23 11/05/2018   AST 16 11/05/2018   NA 138 11/05/2018   K 4.2 11/05/2018   CL 102 11/05/2018   CREATININE 0.52 11/05/2018   BUN 11 11/05/2018   CO2 28 11/05/2018   TSH 1.43 08/24/2017    Mm 3d Screen Breast Bilateral  Result Date: 06/05/2018 CLINICAL DATA:  Screening. EXAM: DIGITAL SCREENING BILATERAL MAMMOGRAM WITH TOMO AND CAD COMPARISON:  Previous exam(s). ACR Breast Density Category b: There are scattered areas of fibroglandular density. FINDINGS: There are no findings suspicious for malignancy. Images were processed with CAD. IMPRESSION: No mammographic evidence of malignancy. A result letter of this screening mammogram will be mailed directly to the patient. RECOMMENDATION: Screening mammogram in one year. (Code:SM-B-01Y) BI-RADS CATEGORY  1: Negative. Electronically Signed   By: Amie Portlandavid  Ormond M.D.   On: 06/05/2018 15:34     Assessment & Plan:  Plan  I have discontinued Alease FrameCynthia R. Muller's FLUoxetine. I am also having her start on sertraline. Additionally, I am having her maintain her aspirin EC, Probiotic Product (PROBIOTIC DAILY PO), lisinopril, atorvastatin, Fluticasone-Salmeterol, Olopatadine HCl, ISOtretinoin, prasugrel, fluticasone, montelukast, cetirizine, valACYclovir, and clonazePAM.  Meds ordered this encounter  Medications  . sertraline (ZOLOFT) 50 MG tablet    Sig: Take 1 tablet (50 mg total) by mouth daily.    Dispense:  30 tablet    Refill:  3  . clonazePAM  (KLONOPIN) 1 MG tablet    Sig: Take 1 tablet (1 mg total) by mouth daily as needed.    Dispense:  90 tablet    Refill:  0    Problem List Items Addressed This Visit      Unprioritized   Generalized anxiety disorder   Relevant Medications   sertraline (ZOLOFT) 50 MG tablet   clonazePAM (KLONOPIN) 1 MG tablet    take 1/2 tab a day for 1 week then inc to 1 po qd F/u 1 week  Or sooner prn   Follow-up: Return in about  4 weeks (around 07/12/2019), or if symptoms worsen or fail to improve, for anxiety.  Donato SchultzYvonne R Lowne Chase, DO

## 2019-06-24 ENCOUNTER — Other Ambulatory Visit: Payer: Self-pay

## 2019-06-24 ENCOUNTER — Ambulatory Visit (HOSPITAL_BASED_OUTPATIENT_CLINIC_OR_DEPARTMENT_OTHER)
Admission: RE | Admit: 2019-06-24 | Discharge: 2019-06-24 | Disposition: A | Discharge status: Home / Self Care | Payer: Medicare Other | Source: Ambulatory Visit | Attending: Family Medicine | Admitting: Family Medicine

## 2019-06-24 ENCOUNTER — Encounter (HOSPITAL_BASED_OUTPATIENT_CLINIC_OR_DEPARTMENT_OTHER): Payer: Self-pay

## 2019-06-24 DIAGNOSIS — Z1231 Encounter for screening mammogram for malignant neoplasm of breast: Secondary | ICD-10-CM | POA: Insufficient documentation

## 2019-07-03 DIAGNOSIS — R0600 Dyspnea, unspecified: Secondary | ICD-10-CM | POA: Diagnosis not present

## 2019-07-03 DIAGNOSIS — F411 Generalized anxiety disorder: Secondary | ICD-10-CM | POA: Diagnosis not present

## 2019-07-03 DIAGNOSIS — R918 Other nonspecific abnormal finding of lung field: Secondary | ICD-10-CM | POA: Diagnosis not present

## 2019-07-12 ENCOUNTER — Other Ambulatory Visit: Payer: Self-pay

## 2019-07-12 DIAGNOSIS — H04123 Dry eye syndrome of bilateral lacrimal glands: Secondary | ICD-10-CM | POA: Diagnosis not present

## 2019-07-12 DIAGNOSIS — H2513 Age-related nuclear cataract, bilateral: Secondary | ICD-10-CM | POA: Diagnosis not present

## 2019-07-12 DIAGNOSIS — H10413 Chronic giant papillary conjunctivitis, bilateral: Secondary | ICD-10-CM | POA: Diagnosis not present

## 2019-07-15 ENCOUNTER — Ambulatory Visit (INDEPENDENT_AMBULATORY_CARE_PROVIDER_SITE_OTHER): Payer: Medicare Other | Admitting: Family Medicine

## 2019-07-15 ENCOUNTER — Encounter: Payer: Self-pay | Admitting: Family Medicine

## 2019-07-15 ENCOUNTER — Other Ambulatory Visit: Payer: Self-pay

## 2019-07-15 VITALS — BP 104/60 | HR 90 | Temp 97.3°F | Resp 18 | Ht 60.0 in | Wt 141.2 lb

## 2019-07-15 DIAGNOSIS — Z23 Encounter for immunization: Secondary | ICD-10-CM

## 2019-07-15 DIAGNOSIS — F411 Generalized anxiety disorder: Secondary | ICD-10-CM

## 2019-07-15 MED ORDER — CLONAZEPAM 1 MG PO TABS: 1.0000 mg | ORAL_TABLET | Freq: Every day | ORAL | 0 refills | Status: DC | PRN

## 2019-07-15 NOTE — Progress Notes (Signed)
Patient ID: Erin Acosta, female    DOB: [birth year]  Age: 68 y.o. MRN: [id number]    Subjective:  Subjective  HPI Erin Acosta presents for f/u anxiety.  She stopped the zoloft due to headache and she has felt fine just taking an occasional klonopin.   Review of Systems  Constitutional: Negative for activity change, appetite change, fatigue and unexpected weight change.  Respiratory: Negative for cough and shortness of breath.   Cardiovascular: Negative for chest pain and palpitations.  Psychiatric/Behavioral: Negative for behavioral problems and dysphoric mood. The patient is nervous/anxious.     History Past Medical History:  Diagnosis Date  . Allergy   . Anxiety   . Depression   . Genital herpes   . H/O cesarean section   . Hypertension   . Rotator cuff tear   . Scoliosis     She has a past surgical history that includes Bilateral carpal tunnel release; Trigger finger release; Foot surgery (Right); Cesarean section; tummy tuck; mini face lift; Blepharoplasty; and Breast excisional biopsy.   Her family history includes Arthritis in an other family member; Heart disease in an other family member; Hyperlipidemia in an other family member; Hypertension in an other family member; Stroke in an other family member.She reports that she has never smoked. She has never used smokeless tobacco. She reports current alcohol use of about 1.0 standard drinks of alcohol per week. She reports that she does not use drugs.  Current Outpatient Medications on File Prior to Visit  Medication Sig Dispense Refill  . aspirin EC 81 MG tablet Take 81 mg by mouth daily.    Marland Kitchen. atorvastatin (LIPITOR) 20 MG tablet Take 1 tablet by mouth daily.    . cetirizine (ZYRTEC) 10 MG tablet Take 1 tablet (10 mg total) by mouth daily. 90 tablet 3  . fluticasone (FLONASE) 50 MCG/ACT nasal spray Place 2 sprays into both nostrils daily. 16 g 6  . Fluticasone-Salmeterol (ADVAIR) 250-50 MCG/DOSE AEPB Inhale 1 puff into  the lungs every 12 (twelve) hours.    . ISOtretinoin (ACCUTANE) 40 MG capsule Take 1 capsule by mouth 2 (two) times daily.    Marland Kitchen. lisinopril (PRINIVIL,ZESTRIL) 5 MG tablet Take 1 tablet (5 mg total) by mouth daily. 90 tablet 1  . montelukast (SINGULAIR) 10 MG tablet Take 1 tablet (10 mg total) by mouth at bedtime. 30 tablet 3  . Olopatadine HCl 0.7 % SOLN Apply to eye.    . prasugrel (EFFIENT) 10 MG TABS tablet Take 1 tablet by mouth daily.    . Probiotic Product (PROBIOTIC DAILY PO) Take by mouth.    . valACYclovir (VALTREX) 500 MG tablet TAKE 1 TABLET DAILY 90 tablet 4   No current facility-administered medications on file prior to visit.      Objective:  Objective  Physical Exam Vitals signs and nursing note reviewed.  Constitutional:      Appearance: She is well-developed.  HENT:     Head: Normocephalic and atraumatic.  Eyes:     Conjunctiva/sclera: Conjunctivae normal.  Neck:     Musculoskeletal: Normal range of motion and neck supple.     Thyroid: No thyromegaly.     Vascular: No carotid bruit or JVD.  Cardiovascular:     Rate and Rhythm: Normal rate and regular rhythm.     Heart sounds: Normal heart sounds. No murmur.  Pulmonary:     Effort: Pulmonary effort is normal. No respiratory distress.     Breath sounds: Normal breath sounds.  No wheezing or rales.  Chest:     Chest wall: No tenderness.  Neurological:     Mental Status: She is alert and oriented to person, place, and time.  Psychiatric:        Mood and Affect: Mood is anxious.    BP 104/60 (BP Location: Left Arm, Patient Position: Sitting, Cuff Size: Normal)   Pulse 90   Temp (!) 97.3 F (36.3 C) (Temporal)   Resp 18   Ht 5' (1.524 m)   Wt 141 lb 3.2 oz (64 kg)   SpO2 95%   BMI 27.58 kg/m  Wt Readings from Last 3 Encounters:  07/15/19 141 lb 3.2 oz (64 kg)  06/14/19 141 lb (64 kg)  11/05/18 144 lb 3.2 oz (65.4 kg)     Lab Results  Component Value Date   WBC 7.5 08/24/2017   HGB 15.1 (H)  08/24/2017   HCT 45.1 08/24/2017   PLT 268.0 08/24/2017   GLUCOSE 96 11/05/2018   CHOL 155 11/05/2018   TRIG 190.0 (H) 11/05/2018   HDL 49.60 11/05/2018   LDLDIRECT 153.0 06/06/2016   LDLCALC 68 11/05/2018   ALT 23 11/05/2018   AST 16 11/05/2018   NA 138 11/05/2018   K 4.2 11/05/2018   CL 102 11/05/2018   CREATININE 0.52 11/05/2018   BUN 11 11/05/2018   CO2 28 11/05/2018   TSH 1.43 08/24/2017    Mm 3d Screen Breast Bilateral  Result Date: 06/24/2019 CLINICAL DATA:  Screening. EXAM: DIGITAL SCREENING BILATERAL MAMMOGRAM WITH TOMO AND CAD COMPARISON:  Previous exam(s). ACR Breast Density Category b: There are scattered areas of fibroglandular density. FINDINGS: There are no findings suspicious for malignancy. Images were processed with CAD. IMPRESSION: No mammographic evidence of malignancy. A result letter of this screening mammogram will be mailed directly to the patient. RECOMMENDATION: Screening mammogram in one year. (Code:SM-B-01Y) BI-RADS CATEGORY  1: Negative. Electronically Signed   By: Franki Cabot M.D.   On: 06/24/2019 17:12     Assessment & Plan:  Plan  I have discontinued [name]. [name]'s sertraline. I am also having her maintain her aspirin EC, Probiotic Product (PROBIOTIC DAILY PO), lisinopril, atorvastatin, Fluticasone-Salmeterol, Olopatadine HCl, ISOtretinoin, prasugrel, fluticasone, montelukast, cetirizine, valACYclovir, and clonazePAM.  Meds ordered this encounter  Medications  . clonazePAM (KLONOPIN) 1 MG tablet    Sig: Take 1 tablet (1 mg total) by mouth daily as needed.    Dispense:  90 tablet    Refill:  0    Problem List Items Addressed This Visit      Unprioritized   Generalized anxiety disorder    Pt only taking prn klonopin Doing well with them-- she does not want anothr med I did still recommend she seek counseling       Relevant Medications   clonazePAM (KLONOPIN) 1 MG tablet    Other Visit Diagnoses    Need for influenza vaccination    -   Primary   Relevant Orders   Flu Vaccine QUAD High Dose(Fluad) (Completed)      Follow-up: Return in about 3 months (around 10/15/2019), or if symptoms worsen or fail to improve.  [name], DO

## 2019-07-15 NOTE — Assessment & Plan Note (Signed)
Pt only taking prn klonopin Doing well with them-- she does not want anothr med I did still recommend she seek counseling

## 2019-08-14 DIAGNOSIS — I2510 Atherosclerotic heart disease of native coronary artery without angina pectoris: Secondary | ICD-10-CM | POA: Diagnosis not present

## 2019-08-14 DIAGNOSIS — E789 Disorder of lipoprotein metabolism, unspecified: Secondary | ICD-10-CM | POA: Diagnosis not present

## 2019-08-14 DIAGNOSIS — I771 Stricture of artery: Secondary | ICD-10-CM | POA: Diagnosis not present

## 2019-08-14 DIAGNOSIS — I10 Essential (primary) hypertension: Secondary | ICD-10-CM | POA: Diagnosis not present

## 2019-08-21 DIAGNOSIS — M654 Radial styloid tenosynovitis [de Quervain]: Secondary | ICD-10-CM | POA: Diagnosis not present

## 2019-10-14 ENCOUNTER — Ambulatory Visit: Payer: Medicare Other | Admitting: Family Medicine

## 2019-10-28 ENCOUNTER — Other Ambulatory Visit: Payer: Self-pay

## 2019-10-29 ENCOUNTER — Other Ambulatory Visit: Payer: Self-pay

## 2019-10-29 ENCOUNTER — Ambulatory Visit (INDEPENDENT_AMBULATORY_CARE_PROVIDER_SITE_OTHER): Payer: Medicare Other | Admitting: Family Medicine

## 2019-10-29 ENCOUNTER — Encounter: Payer: Self-pay | Admitting: Family Medicine

## 2019-10-29 VITALS — BP 118/80 | HR 83 | Temp 97.0°F | Resp 18 | Ht 60.0 in | Wt 146.4 lb

## 2019-10-29 DIAGNOSIS — F418 Other specified anxiety disorders: Secondary | ICD-10-CM

## 2019-10-29 DIAGNOSIS — F419 Anxiety disorder, unspecified: Secondary | ICD-10-CM

## 2019-10-29 DIAGNOSIS — I771 Stricture of artery: Secondary | ICD-10-CM

## 2019-10-29 DIAGNOSIS — E785 Hyperlipidemia, unspecified: Secondary | ICD-10-CM | POA: Diagnosis not present

## 2019-10-29 DIAGNOSIS — I10 Essential (primary) hypertension: Secondary | ICD-10-CM

## 2019-10-29 DIAGNOSIS — Z79899 Other long term (current) drug therapy: Secondary | ICD-10-CM | POA: Diagnosis not present

## 2019-10-29 LAB — LIPID PANEL
Cholesterol: 174 mg/dL (ref 0–200)
HDL: 57.5 mg/dL (ref >39.00)
LDL Cholesterol: 92 mg/dL (ref 0–99)
NonHDL: 116.98
Total CHOL/HDL Ratio: 3
Triglycerides: 127 mg/dL (ref 0.0–149.0)
VLDL: 25.4 mg/dL (ref 0.0–40.0)

## 2019-10-29 LAB — COMPREHENSIVE METABOLIC PANEL
ALT: 19 U/L (ref 0–35)
AST: 16 U/L (ref 0–37)
Albumin: 4.4 g/dL (ref 3.5–5.2)
Alkaline Phosphatase: 79 U/L (ref 39–117)
BUN: 15 mg/dL (ref 6–23)
CO2: 29 mEq/L (ref 19–32)
Calcium: 9.6 mg/dL (ref 8.4–10.5)
Chloride: 102 mEq/L (ref 96–112)
Creatinine, Ser: 0.64 mg/dL (ref 0.40–1.20)
GFR: 92.09 mL/min (ref >60.00)
Glucose, Bld: 93 mg/dL (ref 70–99)
Potassium: 4.8 mEq/L (ref 3.5–5.1)
Sodium: 138 mEq/L (ref 135–145)
Total Bilirubin: 0.7 mg/dL (ref 0.2–1.2)
Total Protein: 6.8 g/dL (ref 6.0–8.3)

## 2019-10-29 NOTE — Assessment & Plan Note (Signed)
Tolerating statin, encouraged heart healthy diet, avoid trans fats, minimize simple carbs and saturated fats. Increase exercise as tolerated 

## 2019-10-29 NOTE — Patient Instructions (Signed)

## 2019-10-29 NOTE — Progress Notes (Signed)
Patient ID: Erin Acosta, female    DOB: 1951/03/21  Age: 68 y.o. MRN: [id number]    Subjective:  Subjective  HPI Erin Acosta presents for f/u anxiety and bp.  She is off lisinopril per cardiologist Pt is frustrated with weight gain.  She is looking forward to exercising again  She has been frustrated with people protesting against [place] and talked about that for a while.   She is very stressed about covid and holidays      No other complaints   Review of Systems  Constitutional: Negative for appetite change, diaphoresis, fatigue and unexpected weight change.  Eyes: Negative for pain, redness and visual disturbance.  Respiratory: Negative for cough, chest tightness, shortness of breath and wheezing.   Cardiovascular: Negative for chest pain, palpitations and leg swelling.  Endocrine: Negative for cold intolerance, heat intolerance, polydipsia, polyphagia and polyuria.  Genitourinary: Negative for difficulty urinating, dysuria and frequency.  Neurological: Negative for dizziness, light-headedness, numbness and headaches.  Psychiatric/Behavioral: Negative for self-injury. The patient is nervous/anxious.     History Past Medical History:  Diagnosis Date   Allergy    Anxiety    Depression    Genital herpes    H/O cesarean section    Hypertension    Rotator cuff tear    Scoliosis     She has a past surgical history that includes Bilateral carpal tunnel release; Trigger finger release; Foot surgery (Right); Cesarean section; tummy tuck; mini face lift; Blepharoplasty; and Breast excisional biopsy.   Her family history includes Arthritis in an other family member; Heart disease in an other family member; Hyperlipidemia in an other family member; Hypertension in an other family member; Stroke in an other family member.She reports that she has never smoked. She has never used smokeless tobacco. She reports current alcohol use of about 1.0 standard  drinks of alcohol per week. She reports that she does not use drugs.  Current Outpatient Medications on File Prior to Visit  Medication Sig Dispense Refill   aspirin EC 81 MG tablet Take 81 mg by mouth daily.     atorvastatin (LIPITOR) 20 MG tablet Take 1 tablet by mouth daily.     cetirizine (ZYRTEC) 10 MG tablet Take 1 tablet (10 mg total) by mouth daily. 90 tablet 3   clonazePAM (KLONOPIN) 1 MG tablet Take 1 tablet (1 mg total) by mouth daily as needed. 90 tablet 0   Probiotic Product (PROBIOTIC DAILY PO) Take by mouth.     spironolactone (ALDACTONE) 25 MG tablet Take 25 mg by mouth daily.     valACYclovir (VALTREX) 500 MG tablet TAKE 1 TABLET DAILY 90 tablet 4   Fluticasone-Salmeterol (ADVAIR) 250-50 MCG/DOSE AEPB Inhale 1 puff into the lungs every 12 (twelve) hours.     ISOtretinoin (ACCUTANE) 40 MG capsule Take 1 capsule by mouth 2 (two) times daily.     Olopatadine HCl 0.7 % SOLN Apply to eye.     prasugrel (EFFIENT) 10 MG TABS tablet Take 1 tablet by mouth daily.     No current facility-administered medications on file prior to visit.      Objective:  Objective  Physical Exam Vitals signs and nursing note reviewed.  Constitutional:      Appearance: She is well-developed.  HENT:     Head: Normocephalic and atraumatic.  Eyes:     Conjunctiva/sclera: Conjunctivae normal.  Neck:     Musculoskeletal: Normal range of motion  and neck supple. Muscular tenderness present.     Thyroid: No thyromegaly.     Vascular: No carotid bruit or JVD.  Cardiovascular:     Rate and Rhythm: Normal rate and regular rhythm.     Heart sounds: Normal heart sounds. No murmur.  Pulmonary:     Effort: Pulmonary effort is normal. No respiratory distress.     Breath sounds: Normal breath sounds. No wheezing or rales.  Chest:     Chest wall: No tenderness.  Neurological:     Mental Status: She is alert and oriented to person, place, and time.  Psychiatric:        Mood and Affect: Mood is  anxious. Mood is not depressed or elated. Affect is not tearful.        Speech: Speech normal.        Behavior: Behavior normal.        Cognition and Memory: Cognition normal.    BP 118/80 (BP Location: Right Arm, Patient Position: Sitting, Cuff Size: Normal)    Pulse 83    Temp (!) 97 F (36.1 C) (Temporal)    Resp 18    Ht 5' (1.524 m)    Wt 146 lb 6.4 oz (66.4 kg)    SpO2 97%    BMI 28.59 kg/m  Wt Readings from Last 3 Encounters:  10/29/19 146 lb 6.4 oz (66.4 kg)  07/15/19 141 lb 3.2 oz (64 kg)  06/14/19 141 lb (64 kg)     Lab Results  Component Value Date   WBC 7.5 08/24/2017   HGB 15.1 (H) 08/24/2017   HCT 45.1 08/24/2017   PLT 268.0 08/24/2017   GLUCOSE 96 11/05/2018   CHOL 155 11/05/2018   TRIG 190.0 (H) 11/05/2018   HDL 49.60 11/05/2018   LDLDIRECT 153.0 06/06/2016   LDLCALC 68 11/05/2018   ALT 23 11/05/2018   AST 16 11/05/2018   NA 138 11/05/2018   K 4.2 11/05/2018   CL 102 11/05/2018   CREATININE 0.52 11/05/2018   BUN 11 11/05/2018   CO2 28 11/05/2018   TSH 1.43 08/24/2017    Mm 3d Screen Breast Bilateral  Result Date: 06/24/2019 CLINICAL DATA:  Screening. EXAM: DIGITAL SCREENING BILATERAL MAMMOGRAM WITH TOMO AND CAD COMPARISON:  Previous exam(s). ACR Breast Density Category b: There are scattered areas of fibroglandular density. FINDINGS: There are no findings suspicious for malignancy. Images were processed with CAD. IMPRESSION: No mammographic evidence of malignancy. A result letter of this screening mammogram will be mailed directly to the patient. RECOMMENDATION: Screening mammogram in one year. (Code:SM-B-01Y) BI-RADS CATEGORY  1: Negative. Electronically Signed   By: Bary Richard M.D.   On: 06/24/2019 17:12     Assessment & Plan:  Plan  I have discontinued Erin Acosta. Erin Acosta's lisinopril, fluticasone, and montelukast. I am also having her maintain her aspirin EC, Probiotic Product (PROBIOTIC DAILY PO), atorvastatin, Fluticasone-Salmeterol, Olopatadine HCl,  ISOtretinoin, prasugrel, cetirizine, valACYclovir, clonazePAM, and spironolactone.  No orders of the defined types were placed in this encounter.   Problem List Items Addressed This Visit      Unprioritized   Depression with anxiety    Pt still refuses more meds and counseling       Essential hypertension    bp good off meds  Well controlled, no changes to meds. Encouraged heart healthy diet such as the DASH diet and exercise as tolerated.       Relevant Medications   spironolactone (ALDACTONE) 25 MG tablet   Hyperlipidemia -  Primary    Tolerating statin, encouraged heart healthy diet, avoid trans fats, minimize simple carbs and saturated fats. Increase exercise as tolerated      Relevant Medications   spironolactone (ALDACTONE) 25 MG tablet   Other Relevant Orders   Lipid panel   Comprehensive metabolic panel   Stenosis of subclavian artery (HCC)   Relevant Medications   spironolactone (ALDACTONE) 25 MG tablet    Other Visit Diagnoses    Anxiety       Relevant Orders   Pain Mgmt, Profile 8 w/Conf, U      Follow-up: Return in 6 months (on 04/28/2020), or if symptoms worsen or fail to improve, for hypertension, anxiety.  [name], DO

## 2019-10-29 NOTE — Assessment & Plan Note (Signed)
bp good off meds  Well controlled, no changes to meds. Encouraged heart healthy diet such as the DASH diet and exercise as tolerated.

## 2019-10-29 NOTE — Progress Notes (Deleted)
Patient ID: Erin Acosta, female    DOB: 1951-05-26  Age: 68 y.o. MRN: [id number]    Subjective:  Subjective  HPI Erin Acosta presents for ***  Review of Systems  Constitutional: Negative for appetite change, diaphoresis, fatigue and unexpected weight change.  Eyes: Negative for pain, redness and visual disturbance.  Respiratory: Negative for cough, chest tightness, shortness of breath and wheezing.   Cardiovascular: Negative for chest pain, palpitations and leg swelling.  Endocrine: Negative for cold intolerance, heat intolerance, polydipsia, polyphagia and polyuria.  Genitourinary: Negative for difficulty urinating, dysuria and frequency.  Neurological: Negative for dizziness, light-headedness, numbness and headaches.  Psychiatric/Behavioral: Negative for decreased concentration and self-injury. The patient is nervous/anxious.     History Past Medical History:  Diagnosis Date  . Allergy   . Anxiety   . Depression   . Genital herpes   . H/O cesarean section   . Hypertension   . Rotator cuff tear   . Scoliosis     She has a past surgical history that includes Bilateral carpal tunnel release; Trigger finger release; Foot surgery (Right); Cesarean section; tummy tuck; mini face lift; Blepharoplasty; and Breast excisional biopsy.   Her family history includes Arthritis in an other family member; Heart disease in an other family member; Hyperlipidemia in an other family member; Hypertension in an other family member; Stroke in an other family member.She reports that she has never smoked. She has never used smokeless tobacco. She reports current alcohol use of about 1.0 standard drinks of alcohol per week. She reports that she does not use drugs.  Current Outpatient Medications on File Prior to Visit  Medication Sig Dispense Refill  . aspirin EC 81 MG tablet Take 81 mg by mouth daily.    . cetirizine (ZYRTEC) 10 MG tablet Take 1 tablet (10 mg total) by mouth daily. 90 tablet 3   . fluticasone (FLONASE) 50 MCG/ACT nasal spray Place 2 sprays into both nostrils daily. 16 g 6  . Fluticasone-Salmeterol (ADVAIR) 250-50 MCG/DOSE AEPB Inhale 1 puff into the lungs every 12 (twelve) hours.    . ISOtretinoin (ACCUTANE) 40 MG capsule Take 1 capsule by mouth 2 (two) times daily.    Marland Kitchen lisinopril (PRINIVIL,ZESTRIL) 5 MG tablet Take 1 tablet (5 mg total) by mouth daily. 90 tablet 1  . montelukast (SINGULAIR) 10 MG tablet Take 1 tablet (10 mg total) by mouth at bedtime. 30 tablet 3  . Olopatadine HCl 0.7 % SOLN Apply to eye.    . prasugrel (EFFIENT) 10 MG TABS tablet Take 1 tablet by mouth daily.    . Probiotic Product (PROBIOTIC DAILY PO) Take by mouth.    Marland Kitchen atorvastatin (LIPITOR) 20 MG tablet Take 1 tablet by mouth daily.    . clonazePAM (KLONOPIN) 1 MG tablet Take 1 tablet (1 mg total) by mouth daily as needed. (Patient not taking: Reported on 10/29/2019) 90 tablet 0  . valACYclovir (VALTREX) 500 MG tablet TAKE 1 TABLET DAILY (Patient not taking: Reported on 10/29/2019) 90 tablet 4   No current facility-administered medications on file prior to visit.      Objective:  Objective  Physical Exam There were no vitals taken for this visit. Wt Readings from Last 3 Encounters:  07/15/19 141 lb 3.2 oz (64 kg)  06/14/19 141 lb (64 kg)  11/05/18 144 lb 3.2 oz (65.4 kg)     Lab Results  Component Value Date   WBC 7.5 08/24/2017   HGB 15.1 (H) 08/24/2017   HCT  45.1 08/24/2017   PLT 268.0 08/24/2017   GLUCOSE 96 11/05/2018   CHOL 155 11/05/2018   TRIG 190.0 (H) 11/05/2018   HDL 49.60 11/05/2018   LDLDIRECT 153.0 06/06/2016   LDLCALC 68 11/05/2018   ALT 23 11/05/2018   AST 16 11/05/2018   NA 138 11/05/2018   K 4.2 11/05/2018   CL 102 11/05/2018   CREATININE 0.52 11/05/2018   BUN 11 11/05/2018   CO2 28 11/05/2018   TSH 1.43 08/24/2017    Mm 3d Screen Breast Bilateral  Result Date: 06/24/2019 CLINICAL DATA:  Screening. EXAM: DIGITAL SCREENING BILATERAL MAMMOGRAM WITH  TOMO AND CAD COMPARISON:  Previous exam(s). ACR Breast Density Category b: There are scattered areas of fibroglandular density. FINDINGS: There are no findings suspicious for malignancy. Images were processed with CAD. IMPRESSION: No mammographic evidence of malignancy. A result letter of this screening mammogram will be mailed directly to the patient. RECOMMENDATION: Screening mammogram in one year. (Code:SM-B-01Y) BI-RADS CATEGORY  1: Negative. Electronically Signed   By: Franki Cabot M.D.   On: 06/24/2019 17:12     Assessment & Plan:  Plan  I am having Derek Jack. Nahar maintain her aspirin EC, Probiotic Product (PROBIOTIC DAILY PO), lisinopril, atorvastatin, Fluticasone-Salmeterol, Olopatadine HCl, ISOtretinoin, prasugrel, fluticasone, montelukast, cetirizine, valACYclovir, and clonazePAM.  No orders of the defined types were placed in this encounter.   Problem List Items Addressed This Visit    None      Follow-up: No follow-ups on file.  [name], DO

## 2019-10-29 NOTE — Assessment & Plan Note (Signed)
Pt still refuses more meds and counseling

## 2019-10-30 LAB — PAIN MGMT, PROFILE 8 W/CONF, U
6 Acetylmorphine: NEGATIVE ng/mL
Alcohol Metabolites: NEGATIVE ng/mL (ref <500)
Amphetamines: NEGATIVE ng/mL
Benzodiazepines: NEGATIVE ng/mL
Buprenorphine, Urine: NEGATIVE ng/mL
Cocaine Metabolite: NEGATIVE ng/mL
Creatinine: 51.1 mg/dL
MDMA: NEGATIVE ng/mL
Marijuana Metabolite: NEGATIVE ng/mL
Opiates: NEGATIVE ng/mL
Oxidant: NEGATIVE ug/mL
Oxycodone: NEGATIVE ng/mL
pH: 6.9 (ref 4.5–9.0)

## 2019-11-12 ENCOUNTER — Encounter: Payer: Self-pay | Admitting: Family Medicine

## 2019-11-18 ENCOUNTER — Other Ambulatory Visit: Payer: Self-pay

## 2019-11-18 ENCOUNTER — Other Ambulatory Visit (HOSPITAL_COMMUNITY)
Admission: RE | Admit: 2019-11-18 | Discharge: 2019-11-18 | Disposition: A | Discharge status: Home / Self Care | Payer: Medicare Other | Source: Ambulatory Visit | Attending: Family Medicine | Admitting: Family Medicine

## 2019-11-18 ENCOUNTER — Encounter: Payer: Self-pay | Admitting: Family Medicine

## 2019-11-18 ENCOUNTER — Ambulatory Visit (INDEPENDENT_AMBULATORY_CARE_PROVIDER_SITE_OTHER): Payer: Medicare Other | Admitting: Family Medicine

## 2019-11-18 VITALS — BP 118/82 | HR 88 | Temp 96.5°F | Resp 17 | Ht 60.0 in | Wt 153.0 lb

## 2019-11-18 DIAGNOSIS — N898 Other specified noninflammatory disorders of vagina: Secondary | ICD-10-CM | POA: Insufficient documentation

## 2019-11-18 DIAGNOSIS — N644 Mastodynia: Secondary | ICD-10-CM

## 2019-11-18 LAB — POC URINALSYSI DIPSTICK (AUTOMATED)
Bilirubin, UA: NEGATIVE
Blood, UA: 10
Glucose, UA: NEGATIVE
Ketones, UA: NEGATIVE
Leukocytes, UA: NEGATIVE
Nitrite, UA: NEGATIVE
Protein, UA: NEGATIVE
Spec Grav, UA: 1.02 (ref 1.010–1.025)
Urobilinogen, UA: 0.2 E.U./dL
pH, UA: 6 (ref 5.0–8.0)

## 2019-11-18 MED ORDER — CEPHALEXIN 500 MG PO CAPS: 500.0000 mg | ORAL_CAPSULE | Freq: Two times a day (BID) | ORAL | 0 refills | Status: DC

## 2019-11-18 MED FILL — CEPHALEXIN 500 MG CAPSULE: 500 | 10 days supply | Qty: 20 | Fill #0

## 2019-11-18 NOTE — Patient Instructions (Signed)

## 2019-11-18 NOTE — Progress Notes (Signed)
Patient ID: Erin Acosta, female    DOB: [birth year]  Age: 68 y.o. MRN: [id number]      Subjective:  Subjective  HPI Erin Acosta presents for r breast tenderness with palpation.   No known injury  She also c/o urinary odor--- no d/c, no dysuria   Review of Systems  Constitutional: Negative for appetite change, diaphoresis, fatigue and unexpected weight change.  Eyes: Negative for pain, redness and visual disturbance.  Respiratory: Negative for cough, chest tightness, shortness of breath and wheezing.   Cardiovascular: Negative for chest pain, palpitations and leg swelling.  Endocrine: Negative for cold intolerance, heat intolerance, polydipsia, polyphagia and polyuria.  Genitourinary: Negative for difficulty urinating, dysuria and frequency.  Neurological: Negative for dizziness, light-headedness, numbness and headaches.    History Past Medical History:  Diagnosis Date  . Allergy   . Anxiety   . Depression   . Genital herpes   . H/O cesarean section   . Hypertension   . Rotator cuff tear   . Scoliosis     She has a past surgical history that includes Bilateral carpal tunnel release; Trigger finger release; Foot surgery (Right); Cesarean section; tummy tuck; mini face lift; Blepharoplasty; and Breast excisional biopsy.   Her family history includes Arthritis in an other family member; Heart disease in an other family member; Hyperlipidemia in an other family member; Hypertension in an other family member; Stroke in an other family member.She reports that she has never smoked. She has never used smokeless tobacco. She reports current alcohol use of about 1.0 standard drinks of alcohol per week. She reports that she does not use drugs.  Current Outpatient Medications on File Prior to Visit  Medication Sig Dispense Refill  . aspirin EC 81 MG tablet Take 81 mg by mouth daily.    Marland Kitchen atorvastatin (LIPITOR) 20 MG tablet Take 1 tablet by mouth daily.    . cetirizine (ZYRTEC) 10 MG  tablet Take 1 tablet (10 mg total) by mouth daily. 90 tablet 3  . clonazePAM (KLONOPIN) 1 MG tablet Take 1 tablet (1 mg total) by mouth daily as needed. 90 tablet 0  . Probiotic Product (PROBIOTIC DAILY PO) Take by mouth.    . spironolactone (ALDACTONE) 25 MG tablet Take 25 mg by mouth daily.    . valACYclovir (VALTREX) 500 MG tablet TAKE 1 TABLET DAILY 90 tablet 4   No current facility-administered medications on file prior to visit.     Objective:  Objective  Physical Exam Vitals and nursing note reviewed.  Constitutional:      Appearance: She is well-developed.  HENT:     Head: Normocephalic and atraumatic.  Eyes:     Conjunctiva/sclera: Conjunctivae normal.  Neck:     Thyroid: No thyromegaly.     Vascular: No carotid bruit or JVD.  Cardiovascular:     Rate and Rhythm: Normal rate and regular rhythm.     Heart sounds: Normal heart sounds. No murmur.  Pulmonary:     Effort: Pulmonary effort is normal. No respiratory distress.     Breath sounds: Normal breath sounds. No wheezing or rales.  Chest:     Chest wall: No tenderness.     Breasts:        Right: Tenderness present.        Left: No swelling, bleeding, inverted nipple, mass, nipple discharge, skin change or tenderness.    Musculoskeletal:     Cervical back: Normal range of motion and neck supple.  Neurological:  Mental Status: She is alert and oriented to person, place, and time.    BP 118/82 (BP Location: Right Arm, Patient Position: Sitting, Cuff Size: Normal)   Pulse 88   Temp (!) 96.5 F (35.8 C) (Temporal)   Resp 17   Ht 5' (1.524 m)   Wt 153 lb (69.4 kg)   SpO2 98%   BMI 29.88 kg/m  Wt Readings from Last 3 Encounters:  11/18/19 153 lb (69.4 kg)  10/29/19 146 lb 6.4 oz (66.4 kg)  07/15/19 141 lb 3.2 oz (64 kg)     Lab Results  Component Value Date   WBC 7.5 08/24/2017   HGB 15.1 (H) 08/24/2017   HCT 45.1 08/24/2017   PLT 268.0 08/24/2017   GLUCOSE 93 10/29/2019   CHOL 174 10/29/2019    TRIG 127.0 10/29/2019   HDL 57.50 10/29/2019   LDLDIRECT 153.0 06/06/2016   LDLCALC 92 10/29/2019   ALT 19 10/29/2019   AST 16 10/29/2019   NA 138 10/29/2019   K 4.8 10/29/2019   CL 102 10/29/2019   CREATININE 0.64 10/29/2019   BUN 15 10/29/2019   CO2 29 10/29/2019   TSH 1.43 08/24/2017    MM 3D SCREEN BREAST BILATERAL  Result Date: 06/24/2019 CLINICAL DATA:  Screening. EXAM: DIGITAL SCREENING BILATERAL MAMMOGRAM WITH TOMO AND CAD COMPARISON:  Previous exam(s). ACR Breast Density Category b: There are scattered areas of fibroglandular density. FINDINGS: There are no findings suspicious for malignancy. Images were processed with CAD. IMPRESSION: No mammographic evidence of malignancy. A result letter of this screening mammogram will be mailed directly to the patient. RECOMMENDATION: Screening mammogram in one year. (Code:SM-B-01Y) BI-RADS CATEGORY  1: Negative. Electronically Signed   By: Franki Cabot M.D.   On: 06/24/2019 17:12     Assessment & Plan:  Plan  I have discontinued Erielle Gawronski. Gaudin's Fluticasone-Salmeterol, Olopatadine HCl, ISOtretinoin, and prasugrel. I am also having her start on cephALEXin. Additionally, I am having her maintain her aspirin EC, Probiotic Product (PROBIOTIC DAILY PO), atorvastatin, cetirizine, valACYclovir, clonazePAM, and spironolactone.  Meds ordered this encounter  Medications  . cephALEXin (KEFLEX) 500 MG capsule    Sig: Take 1 capsule (500 mg total) by mouth 2 (two) times daily.    Dispense:  20 capsule    Refill:  0    Problem List Items Addressed This Visit    None    Visit Diagnoses    Breast pain, right    -  Primary   Relevant Medications   cephALEXin (KEFLEX) 500 MG capsule   Other Relevant Orders   MM Digital Diagnostic Bilat   US BREAST COMPLETE UNI RIGHT INC AXILLA   Vaginal odor       Relevant Orders   Urine cytology ancillary only(Tea)   POCT Urinalysis Dipstick (Automated) (Completed)      Follow-up: Return if  symptoms worsen or fail to improve.  [name], DO

## 2019-11-26 LAB — URINE CYTOLOGY ANCILLARY ONLY
Bacterial Vaginitis-Urine: NEGATIVE
Candida Urine: NEGATIVE
Chlamydia: NEGATIVE
Comment: NEGATIVE
Comment: NEGATIVE
Comment: NORMAL
Neisseria Gonorrhea: NEGATIVE
Trichomonas: NEGATIVE

## 2019-12-06 ENCOUNTER — Ambulatory Visit
Admission: RE | Admit: 2019-12-06 | Discharge: 2019-12-06 | Disposition: A | Discharge status: Home / Self Care | Payer: Medicare Other | Source: Ambulatory Visit | Attending: Family Medicine | Admitting: Family Medicine

## 2019-12-06 ENCOUNTER — Other Ambulatory Visit: Payer: Self-pay

## 2019-12-06 DIAGNOSIS — N644 Mastodynia: Secondary | ICD-10-CM

## 2019-12-06 DIAGNOSIS — N6489 Other specified disorders of breast: Secondary | ICD-10-CM | POA: Diagnosis not present

## 2019-12-06 DIAGNOSIS — R922 Inconclusive mammogram: Secondary | ICD-10-CM | POA: Diagnosis not present

## 2019-12-09 ENCOUNTER — Encounter: Payer: Self-pay | Admitting: Family Medicine

## 2019-12-12 ENCOUNTER — Other Ambulatory Visit: Payer: Self-pay

## 2019-12-12 ENCOUNTER — Ambulatory Visit (INDEPENDENT_AMBULATORY_CARE_PROVIDER_SITE_OTHER): Payer: Medicare Other | Admitting: Family Medicine

## 2019-12-12 ENCOUNTER — Encounter: Payer: Self-pay | Admitting: Family Medicine

## 2019-12-12 VITALS — Ht 60.0 in | Wt 150.0 lb

## 2019-12-12 DIAGNOSIS — T7840XA Allergy, unspecified, initial encounter: Secondary | ICD-10-CM | POA: Diagnosis not present

## 2019-12-12 DIAGNOSIS — E663 Overweight: Secondary | ICD-10-CM | POA: Diagnosis not present

## 2019-12-12 DIAGNOSIS — F418 Other specified anxiety disorders: Secondary | ICD-10-CM

## 2019-12-12 MED ORDER — BUPROPION HCL ER (XL) 150 MG PO TB24: 150.0000 mg | ORAL_TABLET | Freq: Every day | ORAL | 0 refills | Status: DC

## 2019-12-12 MED ORDER — CETIRIZINE HCL 10 MG PO TABS: 10.0000 mg | ORAL_TABLET | Freq: Every day | ORAL | 3 refills | Status: DC

## 2019-12-12 NOTE — Patient Instructions (Addendum)
COVID-19 Vaccine Information can be found at: [email or web address] For questions related to vaccine distribution or appointments, please email [email or web address] or call [phone].   +++++++++++++++++++++++++    Major Depressive Disorder, Adult Major depressive disorder (MDD) is a mental health condition. MDD often makes you feel sad, hopeless, or helpless. MDD can also cause symptoms in your body. MDD can affect your:  Work.  School.  Relationships.  Other normal activities. MDD can range from mild to very bad. It may occur once (single episode MDD). It can also occur many times (recurrent MDD). The main symptoms of MDD often include:  Feeling sad, depressed, or irritable most of the time.  Loss of interest. MDD symptoms also include:  Sleeping too much or too little.  Eating too much or too little.  A change in your weight.  Feeling tired (fatigue) or having low energy.  Feeling worthless.  Feeling guilty.  Trouble making decisions.  Trouble thinking clearly.  Thoughts of suicide or harming others.  Feeling weak.  Feeling agitated.  Keeping yourself from being around other people (isolation). Follow these instructions at home: Activity  Do these things as told by your doctor: ? Go back to your normal activities. ? Exercise regularly. ? Spend time outdoors. Alcohol  Talk with your doctor about how alcohol can affect your antidepressant medicines.  Do not drink alcohol. Or, limit how much alcohol you drink. ? This means no more than 1 drink a day for nonpregnant women and 2 drinks a day for men. One drink equals one of these:  12 oz of beer.  5 oz of wine.  1 oz of hard liquor. General instructions  Take over-the-counter and prescription medicines only as told by your doctor.  Eat a healthy diet.  Get plenty of sleep.  Find activities that you enjoy. Make time to do  them.  Think about joining a support group. Your doctor may be able to suggest a group for you.  Keep all follow-up visits as told by your doctor. This is important. Where to find more information:  National Alliance on Mental Illness: ? www.nami.org  [place] National Institute of Mental Health: ? www.nimh.nih.gov  National Suicide Prevention Lifeline: ? 1-800-273-8255. This is free, 24-hour help. Contact a doctor if:  Your symptoms get worse.  You have new symptoms. Get help right away if:  You self-harm.  You see, hear, taste, smell, or feel things that are not present (hallucinate). If you ever feel like you may hurt yourself or others, or have thoughts about taking your own life, get help right away. You can go to your nearest emergency department or call:  Your local emergency services (911 in the [place]  A suicide crisis helpline, such as the National Suicide Prevention Lifeline: ? 1-800-273-8255. This is open 24 hours a day. This information is not intended to replace advice given to you by your health care provider. Make sure you discuss any questions you have with your health care provider. Document Revised: 10/20/2017 Document Reviewed: 07/24/2016 Elsevier Patient Education  2020 Elsevier Inc.  

## 2019-12-12 NOTE — Progress Notes (Signed)
Virtual Visit via Video Note  I connected with Erin Acosta on 12/12/19 at  4:00 PM EST by a video enabled telemedicine application and verified that I am speaking with the correct person using two identifiers.  Location: Patient: home alone  Provider: office    I discussed the limitations of evaluation and management by telemedicine and the availability of in person appointments. The patient expressed understanding and agreed to proceed.  History of Present Illness: Pt is home c/o difficulty  Observations/Objective: There were no vitals filed for this visit.   Assessment and Plan: 1. Allergy, initial encounter Stable , refill meds  - cetirizine (ZYRTEC) 10 MG tablet; Take 1 tablet (10 mg total) by mouth daily.  Dispense: 90 tablet; Refill: 3  2. Overweight (BMI 25.0-29.9) D/w pt -- she is not a good candidate for phenteramine ---  Her ins will not pay for saxenda We will start wellbutrin for depression which may help with appetite  3. Depression with anxiety  - buPROPion (WELLBUTRIN XL) 150 MG 24 hr tablet; Take 1 tablet (150 mg total) by mouth daily.  Dispense: 30 tablet; Refill: 0 F/u 1 month Follow Up Instructions:    I discussed the assessment and treatment plan with the patient. The patient was provided an opportunity to ask questions and all were answered. The patient agreed with the plan and demonstrated an understanding of the instructions.   The patient was advised to call back or seek an in-person evaluation if the symptoms worsen or if the condition fails to improve as anticipated.  I provided 20 minutes of non-face-to-face time during this encounter.   [name], DO

## 2020-01-07 DIAGNOSIS — L700 Acne vulgaris: Secondary | ICD-10-CM | POA: Diagnosis not present

## 2020-01-08 DIAGNOSIS — I700 Atherosclerosis of aorta: Secondary | ICD-10-CM | POA: Diagnosis not present

## 2020-01-08 DIAGNOSIS — R918 Other nonspecific abnormal finding of lung field: Secondary | ICD-10-CM | POA: Diagnosis not present

## 2020-01-08 DIAGNOSIS — R0600 Dyspnea, unspecified: Secondary | ICD-10-CM | POA: Diagnosis not present

## 2020-01-08 DIAGNOSIS — R911 Solitary pulmonary nodule: Secondary | ICD-10-CM | POA: Diagnosis not present

## 2020-01-10 ENCOUNTER — Other Ambulatory Visit: Payer: Self-pay | Admitting: Family Medicine

## 2020-01-10 DIAGNOSIS — F411 Generalized anxiety disorder: Secondary | ICD-10-CM

## 2020-01-10 MED ORDER — CLONAZEPAM 1 MG PO TABS: 1.0000 mg | ORAL_TABLET | Freq: Every day | ORAL | 0 refills | Status: DC | PRN

## 2020-01-10 NOTE — Telephone Encounter (Signed)
Medication:clonazePAM (KLONOPIN) 1 MG tablet [  Has the patient contacted their pharmacy? No. (If no, request that the patient contact the pharmacy for the refill.)  (If yes, when and what did the pharmacy advise?)  Preferred Pharmacy (with phone number or street name): [facility]  Is the correct pharmacy   Agent: Please be advised that RX refills may take up to 3 business days. We ask that you follow-up with your pharmacy.

## 2020-01-10 NOTE — Telephone Encounter (Signed)
Requesting: Klonopin Contract: 05/04/2018 UDS: 10/29/19 Last OV: 12/12/19 Next OV: N/A Last Refill: 07/15/2019, #90--0 RF Database:   Please advise

## 2020-01-13 ENCOUNTER — Other Ambulatory Visit: Payer: Self-pay | Admitting: Family Medicine

## 2020-01-13 ENCOUNTER — Other Ambulatory Visit: Payer: Self-pay

## 2020-01-13 DIAGNOSIS — F411 Generalized anxiety disorder: Secondary | ICD-10-CM

## 2020-01-13 MED ORDER — CLONAZEPAM 1 MG PO TABS: 1.0000 mg | ORAL_TABLET | Freq: Every day | ORAL | 0 refills | Status: DC | PRN

## 2020-01-13 NOTE — Telephone Encounter (Signed)
Patient called in to see if Dr. Laury Axon can resend the prescription for clonazePAM (KLONOPIN) 1 MG tablet [[id number]]    To the correct pharmacy that the patient will be using going forward.  Please update in the patients profile thanks.    [facility]  Telephone # [phone]

## 2020-01-14 DIAGNOSIS — M7062 Trochanteric bursitis, left hip: Secondary | ICD-10-CM | POA: Diagnosis not present

## 2020-01-21 ENCOUNTER — Telehealth: Payer: Self-pay

## 2020-01-21 DIAGNOSIS — F418 Other specified anxiety disorders: Secondary | ICD-10-CM

## 2020-01-21 MED ORDER — BUPROPION HCL ER (XL) 150 MG PO TB24: 150.0000 mg | ORAL_TABLET | Freq: Every day | ORAL | 2 refills | Status: DC

## 2020-01-21 NOTE — Telephone Encounter (Signed)
Refill sent.

## 2020-01-21 NOTE — Telephone Encounter (Signed)
Patient called in to see if Dr. Laury Axon could send in a prescription for  buPROPion (WELLBUTRIN XL) 150 MG 24 hr tablet [[id number]]    Please send to [facility] [address] [place], [place] - [address]  [address] [place] [address]  Phone:  [phone] Fax:  [phone]  DEA #:  --  Thanks,

## 2020-02-06 ENCOUNTER — Telehealth: Payer: Self-pay

## 2020-02-06 DIAGNOSIS — E782 Mixed hyperlipidemia: Secondary | ICD-10-CM | POA: Diagnosis not present

## 2020-02-06 DIAGNOSIS — I2510 Atherosclerotic heart disease of native coronary artery without angina pectoris: Secondary | ICD-10-CM | POA: Diagnosis not present

## 2020-02-06 NOTE — Telephone Encounter (Signed)
Patient called in to get Dr. Laury Axon to send in a prescription for valACYclovir (VALTREX) 500 MG tablet [[id number]]    Please send it to [facility] - [place], [place] - [address]  [address] [place] [address]  Phone:  [phone] Fax:  [phone]  DEA #:  --

## 2020-02-07 MED ORDER — VALACYCLOVIR HCL 500 MG PO TABS: 500.0000 mg | ORAL_TABLET | Freq: Every day | ORAL | 4 refills | Status: DC

## 2020-02-07 NOTE — Telephone Encounter (Signed)
Refill sent.

## 2020-02-14 ENCOUNTER — Other Ambulatory Visit: Payer: Self-pay

## 2020-02-14 ENCOUNTER — Ambulatory Visit (INDEPENDENT_AMBULATORY_CARE_PROVIDER_SITE_OTHER): Payer: Medicare Other | Admitting: Family Medicine

## 2020-02-14 DIAGNOSIS — R079 Chest pain, unspecified: Secondary | ICD-10-CM | POA: Diagnosis not present

## 2020-02-14 DIAGNOSIS — F419 Anxiety disorder, unspecified: Secondary | ICD-10-CM | POA: Diagnosis not present

## 2020-02-14 MED ORDER — ESCITALOPRAM OXALATE 10 MG PO TABS: 10.0000 mg | ORAL_TABLET | Freq: Every day | ORAL | 1 refills | Status: DC

## 2020-02-14 NOTE — Progress Notes (Signed)
Virtual Visit via Video Note  I connected with Erin Acosta on 02/14/20 at  1:40 PM EDT by a video enabled telemedicine application and verified that I am speaking with the correct person using two identifiers.  Location: Patient: home alone  Provider: office    I discussed the limitations of evaluation and management by telemedicine and the availability of in person appointments. The patient expressed understanding and agreed to proceed.  History of Present Illness: Pt has been home c/o chest pressure and sob=== she saw cardiology--- and stress test was scheduled for end of April.  Pt is calling us because anxiety is worsening because of cp and sob.  She has been taking a klonopin daily and sometimes bid     Observations/Objective: There were no vitals filed for this visit. Pt is in nad, no chest pain today   Assessment and Plan: 1. Anxiety Start lexapro,   con't klonopin  F/u 1 month or sooner prn  - escitalopram (LEXAPRO) 10 MG tablet; Take 1 tablet (10 mg total) by mouth daily.  Dispense: 30 tablet; Refill: 1  2. Chest pain, unspecified type Pt has spoken to cardiology  I advised she call them back to see if she can be seen sooner Pt advised to go to ER if cp returns   Follow Up Instructions:    I discussed the assessment and treatment plan with the patient. The patient was provided an opportunity to ask questions and all were answered. The patient agreed with the plan and demonstrated an understanding of the instructions.   The patient was advised to call back or seek an in-person evaluation if the symptoms worsen or if the condition fails to improve as anticipated.  I provided 30 minutes of non-face-to-face time during this encounter.   [name], DO

## 2020-02-14 NOTE — Patient Instructions (Signed)

## 2020-02-15 ENCOUNTER — Encounter: Payer: Self-pay | Admitting: Family Medicine

## 2020-03-02 DIAGNOSIS — M25552 Pain in left hip: Secondary | ICD-10-CM | POA: Diagnosis not present

## 2020-03-02 DIAGNOSIS — M6281 Muscle weakness (generalized): Secondary | ICD-10-CM | POA: Diagnosis not present

## 2020-03-04 DIAGNOSIS — M25552 Pain in left hip: Secondary | ICD-10-CM | POA: Diagnosis not present

## 2020-03-04 DIAGNOSIS — M6281 Muscle weakness (generalized): Secondary | ICD-10-CM | POA: Diagnosis not present

## 2020-03-10 DIAGNOSIS — M25552 Pain in left hip: Secondary | ICD-10-CM | POA: Diagnosis not present

## 2020-03-10 DIAGNOSIS — M6281 Muscle weakness (generalized): Secondary | ICD-10-CM | POA: Diagnosis not present

## 2020-03-12 DIAGNOSIS — I2510 Atherosclerotic heart disease of native coronary artery without angina pectoris: Secondary | ICD-10-CM | POA: Diagnosis not present

## 2020-03-13 DIAGNOSIS — M6281 Muscle weakness (generalized): Secondary | ICD-10-CM | POA: Diagnosis not present

## 2020-03-13 DIAGNOSIS — M25552 Pain in left hip: Secondary | ICD-10-CM | POA: Diagnosis not present

## 2020-03-18 DIAGNOSIS — M25552 Pain in left hip: Secondary | ICD-10-CM | POA: Diagnosis not present

## 2020-03-18 DIAGNOSIS — M6281 Muscle weakness (generalized): Secondary | ICD-10-CM | POA: Diagnosis not present

## 2020-03-23 DIAGNOSIS — M6281 Muscle weakness (generalized): Secondary | ICD-10-CM | POA: Diagnosis not present

## 2020-03-23 DIAGNOSIS — M25552 Pain in left hip: Secondary | ICD-10-CM | POA: Diagnosis not present

## 2020-03-26 DIAGNOSIS — M25552 Pain in left hip: Secondary | ICD-10-CM | POA: Diagnosis not present

## 2020-03-26 DIAGNOSIS — M6281 Muscle weakness (generalized): Secondary | ICD-10-CM | POA: Diagnosis not present

## 2020-03-30 DIAGNOSIS — M25552 Pain in left hip: Secondary | ICD-10-CM | POA: Diagnosis not present

## 2020-03-30 DIAGNOSIS — M6281 Muscle weakness (generalized): Secondary | ICD-10-CM | POA: Diagnosis not present

## 2020-04-01 DIAGNOSIS — M6281 Muscle weakness (generalized): Secondary | ICD-10-CM | POA: Diagnosis not present

## 2020-04-01 DIAGNOSIS — M25552 Pain in left hip: Secondary | ICD-10-CM | POA: Diagnosis not present

## 2020-04-06 DIAGNOSIS — M6281 Muscle weakness (generalized): Secondary | ICD-10-CM | POA: Diagnosis not present

## 2020-04-06 DIAGNOSIS — M25552 Pain in left hip: Secondary | ICD-10-CM | POA: Diagnosis not present

## 2020-04-08 DIAGNOSIS — M25552 Pain in left hip: Secondary | ICD-10-CM | POA: Diagnosis not present

## 2020-04-08 DIAGNOSIS — M6281 Muscle weakness (generalized): Secondary | ICD-10-CM | POA: Diagnosis not present

## 2020-04-09 ENCOUNTER — Telehealth: Payer: Self-pay | Admitting: Family Medicine

## 2020-04-09 NOTE — Telephone Encounter (Signed)
Medication: escitalopram (LEXAPRO) 10 MG tablet clonazePAM (KLONOPIN) 1 MG tablet  Request for 90 day supply Pat states LExapro is working well  Has the patient contacted their pharmacy? No. (If no, request that the patient contact the pharmacy for the refill.) (If yes, when and what did the pharmacy advise?)  Preferred Pharmacy (with phone number or street name): [facility] - [place], [place] - [address] Phone:  [phone]  Fax:  [phone]      Agent: Please be advised that RX refills may take up to 3 business days. We ask that you follow-up with your pharmacy.

## 2020-04-09 NOTE — Telephone Encounter (Signed)
Appointment made

## 2020-04-09 NOTE — Telephone Encounter (Signed)
Patient was last seen 02/14/20, advised to follow up in 4 weeks. Appointment needed for 90 day supply of both medications. Please assist with scheduling, thanks.

## 2020-04-16 ENCOUNTER — Ambulatory Visit: Payer: Medicare Other | Admitting: Family Medicine

## 2020-04-21 ENCOUNTER — Ambulatory Visit (INDEPENDENT_AMBULATORY_CARE_PROVIDER_SITE_OTHER): Payer: Medicare Other | Admitting: Family Medicine

## 2020-04-21 ENCOUNTER — Encounter: Payer: Self-pay | Admitting: Family Medicine

## 2020-04-21 ENCOUNTER — Other Ambulatory Visit: Payer: Self-pay | Admitting: Family Medicine

## 2020-04-21 ENCOUNTER — Other Ambulatory Visit: Payer: Self-pay

## 2020-04-21 VITALS — BP 110/80 | HR 68 | Temp 97.6°F | Resp 18 | Ht 60.0 in | Wt 148.2 lb

## 2020-04-21 DIAGNOSIS — E785 Hyperlipidemia, unspecified: Secondary | ICD-10-CM

## 2020-04-21 DIAGNOSIS — R32 Unspecified urinary incontinence: Secondary | ICD-10-CM | POA: Insufficient documentation

## 2020-04-21 DIAGNOSIS — E1169 Type 2 diabetes mellitus with other specified complication: Secondary | ICD-10-CM

## 2020-04-21 DIAGNOSIS — E663 Overweight: Secondary | ICD-10-CM

## 2020-04-21 DIAGNOSIS — F419 Anxiety disorder, unspecified: Secondary | ICD-10-CM | POA: Diagnosis not present

## 2020-04-21 DIAGNOSIS — I10 Essential (primary) hypertension: Secondary | ICD-10-CM | POA: Diagnosis not present

## 2020-04-21 DIAGNOSIS — Z79899 Other long term (current) drug therapy: Secondary | ICD-10-CM | POA: Diagnosis not present

## 2020-04-21 DIAGNOSIS — T7840XA Allergy, unspecified, initial encounter: Secondary | ICD-10-CM | POA: Diagnosis not present

## 2020-04-21 DIAGNOSIS — E1165 Type 2 diabetes mellitus with hyperglycemia: Secondary | ICD-10-CM

## 2020-04-21 DIAGNOSIS — F411 Generalized anxiety disorder: Secondary | ICD-10-CM

## 2020-04-21 LAB — COMPREHENSIVE METABOLIC PANEL
ALT: 16 U/L (ref 0–35)
AST: 15 U/L (ref 0–37)
Albumin: 4.2 g/dL (ref 3.5–5.2)
Alkaline Phosphatase: 85 U/L (ref 39–117)
BUN: 11 mg/dL (ref 6–23)
CO2: 28 mEq/L (ref 19–32)
Calcium: 9.5 mg/dL (ref 8.4–10.5)
Chloride: 103 mEq/L (ref 96–112)
Creatinine, Ser: 0.6 mg/dL (ref 0.40–1.20)
GFR: 99.07 mL/min (ref >60.00)
Glucose, Bld: 101 mg/dL — ABNORMAL HIGH (ref 70–99)
Potassium: 5 mEq/L (ref 3.5–5.1)
Sodium: 138 mEq/L (ref 135–145)
Total Bilirubin: 0.6 mg/dL (ref 0.2–1.2)
Total Protein: 6.3 g/dL (ref 6.0–8.3)

## 2020-04-21 LAB — LIPID PANEL
Cholesterol: 139 mg/dL (ref 0–200)
HDL: 37.7 mg/dL — ABNORMAL LOW (ref >39.00)
NonHDL: 101.6
Total CHOL/HDL Ratio: 4
Triglycerides: 235 mg/dL — ABNORMAL HIGH (ref 0.0–149.0)
VLDL: 47 mg/dL — ABNORMAL HIGH (ref 0.0–40.0)

## 2020-04-21 LAB — LDL CHOLESTEROL, DIRECT: Direct LDL: 73 mg/dL

## 2020-04-21 MED ORDER — CETIRIZINE HCL 10 MG PO TABS: 10.0000 mg | ORAL_TABLET | Freq: Every day | ORAL | 3 refills | Status: DC

## 2020-04-21 MED ORDER — CLONAZEPAM 1 MG PO TABS: 1.0000 mg | ORAL_TABLET | Freq: Every day | ORAL | 0 refills | Status: DC | PRN

## 2020-04-21 MED ORDER — MIRABEGRON ER 50 MG PO TB24: 50.0000 mg | ORAL_TABLET | Freq: Every day | ORAL | 3 refills | Status: DC

## 2020-04-21 MED ORDER — ESCITALOPRAM OXALATE 10 MG PO TABS: 10.0000 mg | ORAL_TABLET | Freq: Every day | ORAL | 1 refills | Status: DC

## 2020-04-21 NOTE — Progress Notes (Signed)
Patient ID: Erin Acosta, female    DOB: [birth year]  Age: 69 y.o. MRN: [id number]    Subjective:  Subjective  HPI Erin Acosta presents for f/u anxiety. She is doing much better but is still getting sob.  Cardiology and pulm have found nothing wrong.  She admits to not exercising like she should.   Pt also c/o incontinence esp with sneezing/ coughing etc.  She d/w gyn --who told her to use a pad but she really does not want to wear pads.  She would like to try a med because she likes to travel Review of Systems  Constitutional: Negative for appetite change, diaphoresis, fatigue and unexpected weight change.  Eyes: Negative for pain, redness and visual disturbance.  Respiratory: Positive for shortness of breath. Negative for cough, chest tightness and wheezing.   Cardiovascular: Negative for chest pain, palpitations and leg swelling.  Endocrine: Negative for cold intolerance, heat intolerance, polydipsia, polyphagia and polyuria.  Genitourinary: Negative for difficulty urinating, dysuria and frequency.  Neurological: Negative for dizziness, light-headedness, numbness and headaches.    History Past Medical History:  Diagnosis Date  . Allergy   . Anxiety   . Depression   . Genital herpes   . H/O cesarean section   . Hypertension   . Rotator cuff tear   . Scoliosis     She has a past surgical history that includes Bilateral carpal tunnel release; Trigger finger release; Foot surgery (Right); Cesarean section; tummy tuck; mini face lift; Blepharoplasty; and Breast biopsy.   Her family history includes Arthritis in an other family member; Heart disease in an other family member; Hyperlipidemia in an other family member; Hypertension in an other family member; Stroke in an other family member.She reports that she has never smoked. She has never used smokeless tobacco. She reports current alcohol use of about 1.0 standard drinks of alcohol per week. She reports that she does not use  drugs.  Current Outpatient Medications on File Prior to Visit  Medication Sig Dispense Refill  . aspirin EC 81 MG tablet Take 81 mg by mouth daily.    Marland Kitchen atorvastatin (LIPITOR) 20 MG tablet Take 1 tablet by mouth daily.    Marland Kitchen olopatadine (PATANOL) 0.1 % ophthalmic solution     . Probiotic Product (PROBIOTIC DAILY PO) Take by mouth.    . spironolactone (ALDACTONE) 25 MG tablet Take 25 mg by mouth daily.    . valACYclovir (VALTREX) 500 MG tablet Take 1 tablet (500 mg total) by mouth daily. 90 tablet 4   No current facility-administered medications on file prior to visit.     Objective:  Objective  Physical Exam Vitals and nursing note reviewed.  Constitutional:      Appearance: She is well-developed.  HENT:     Head: Normocephalic and atraumatic.  Eyes:     Conjunctiva/sclera: Conjunctivae normal.  Neck:     Thyroid: No thyromegaly.     Vascular: No carotid bruit or JVD.  Cardiovascular:     Rate and Rhythm: Normal rate and regular rhythm.     Heart sounds: Normal heart sounds. No murmur.  Pulmonary:     Effort: Pulmonary effort is normal. No respiratory distress.     Breath sounds: Normal breath sounds. No wheezing or rales.  Chest:     Chest wall: No tenderness.  Musculoskeletal:     Cervical back: Normal range of motion and neck supple.  Neurological:     Mental Status: She is alert and oriented to  person, place, and time.    BP 110/80 (BP Location: Left Arm, Patient Position: Sitting, Cuff Size: Normal)   Pulse 68   Temp 97.6 F (36.4 C) (Temporal)   Resp 18   Ht 5' (1.524 m)   Wt 148 lb 3.2 oz (67.2 kg)   SpO2 97%   BMI 28.94 kg/m  Wt Readings from Last 3 Encounters:  04/21/20 148 lb 3.2 oz (67.2 kg)  12/12/19 150 lb (68 kg)  11/18/19 153 lb (69.4 kg)     Lab Results  Component Value Date   WBC 7.5 08/24/2017   HGB 15.1 (H) 08/24/2017   HCT 45.1 08/24/2017   PLT 268.0 08/24/2017   GLUCOSE 93 10/29/2019   CHOL 174 10/29/2019   TRIG 127.0 10/29/2019    HDL 57.50 10/29/2019   LDLDIRECT 153.0 06/06/2016   LDLCALC 92 10/29/2019   ALT 19 10/29/2019   AST 16 10/29/2019   NA 138 10/29/2019   K 4.8 10/29/2019   CL 102 10/29/2019   CREATININE 0.64 10/29/2019   BUN 15 10/29/2019   CO2 29 10/29/2019   TSH 1.43 08/24/2017    US BREAST LTD UNI RIGHT INC AXILLA  Result Date: 12/06/2019 CLINICAL DATA:  Patient presents for focal tenderness within the right breast. EXAM: DIGITAL DIAGNOSTIC RIGHT MAMMOGRAM WITH CAD AND TOMO ULTRASOUND RIGHT BREAST COMPARISON:  Previous exam(s). ACR Breast Density Category c: The breast tissue is heterogeneously dense, which may obscure small masses. FINDINGS: No concerning masses, calcifications or distortion identified within the right breast. Specifically, no concerning abnormality underlying the palpable marker within the anterior right breast. Mammographic images were processed with CAD. Targeted ultrasound is performed, showing normal tissue without suspicious mass within the right breast 6 o'clock position at the site of focal tenderness. IMPRESSION: No mammographic evidence for malignancy. RECOMMENDATION: Continued clinical evaluation for right breast tenderness. Return to annual screening mammography 06/2020 I have discussed the findings and recommendations with the patient. If applicable, a reminder letter will be sent to the patient regarding the next appointment. BI-RADS CATEGORY  2: Benign. Electronically Signed   By: Annia Belt M.D.   On: 12/06/2019 11:49   MM DIAG BREAST TOMO UNI RIGHT  Result Date: 12/06/2019 CLINICAL DATA:  Patient presents for focal tenderness within the right breast. EXAM: DIGITAL DIAGNOSTIC RIGHT MAMMOGRAM WITH CAD AND TOMO ULTRASOUND RIGHT BREAST COMPARISON:  Previous exam(s). ACR Breast Density Category c: The breast tissue is heterogeneously dense, which may obscure small masses. FINDINGS: No concerning masses, calcifications or distortion identified within the right breast.  Specifically, no concerning abnormality underlying the palpable marker within the anterior right breast. Mammographic images were processed with CAD. Targeted ultrasound is performed, showing normal tissue without suspicious mass within the right breast 6 o'clock position at the site of focal tenderness. IMPRESSION: No mammographic evidence for malignancy. RECOMMENDATION: Continued clinical evaluation for right breast tenderness. Return to annual screening mammography 06/2020 I have discussed the findings and recommendations with the patient. If applicable, a reminder letter will be sent to the patient regarding the next appointment. BI-RADS CATEGORY  2: Benign. Electronically Signed   By: Annia Belt M.D.   On: 12/06/2019 11:49     Assessment & Plan:  Plan  I am having Erin Acosta. Erin Acosta start on mirabegron ER. I am also having her maintain her aspirin EC, Probiotic Product (PROBIOTIC DAILY PO), atorvastatin, spironolactone, valACYclovir, olopatadine, escitalopram, cetirizine, and clonazePAM.  Meds ordered this encounter  Medications  . escitalopram (LEXAPRO) 10 MG tablet  Sig: Take 1 tablet (10 mg total) by mouth daily.    Dispense:  90 tablet    Refill:  1  . cetirizine (ZYRTEC) 10 MG tablet    Sig: Take 1 tablet (10 mg total) by mouth daily.    Dispense:  90 tablet    Refill:  3  . clonazePAM (KLONOPIN) 1 MG tablet    Sig: Take 1 tablet (1 mg total) by mouth daily as needed.    Dispense:  90 tablet    Refill:  0  . mirabegron ER (MYRBETRIQ) 50 MG TB24 tablet    Sig: Take 1 tablet (50 mg total) by mouth daily.    Dispense:  90 tablet    Refill:  3    Problem List Items Addressed This Visit      Unprioritized   Allergies    Stable  con't prn meds      Relevant Medications   cetirizine (ZYRTEC) 10 MG tablet   Essential hypertension    Well controlled, no changes to meds. Encouraged heart healthy diet such as the DASH diet and exercise as tolerated.       Generalized anxiety  disorder    Stable con't meds F/u 6 months or sooner prn       Relevant Medications   escitalopram (LEXAPRO) 10 MG tablet   clonazePAM (KLONOPIN) 1 MG tablet   Hyperlipidemia    Tolerating statin, encouraged heart healthy diet, avoid trans fats, minimize simple carbs and saturated fats. Increase exercise as tolerated      Relevant Orders   Lipid panel   Comprehensive metabolic panel   Incontinence in female   Relevant Medications   mirabegron ER (MYRBETRIQ) 50 MG TB24 tablet   Overweight (BMI 25.0-29.9)    Encourage pt to exercise and increase stamina  Con' t to watch diet        Other Visit Diagnoses    High risk medication use    -  Primary   Relevant Orders   DRUG MONITORING, PANEL 8 WITH CONFIRMATION, URINE   Anxiety       Relevant Medications   escitalopram (LEXAPRO) 10 MG tablet      Follow-up: Return in about 6 months (around 10/21/2020), or if symptoms worsen or fail to improve.  [name], DO

## 2020-04-21 NOTE — Assessment & Plan Note (Signed)
Stable con't meds F/u 6 months or sooner prn

## 2020-04-21 NOTE — Assessment & Plan Note (Signed)
Stable  con't prn meds

## 2020-04-21 NOTE — Assessment & Plan Note (Signed)
Tolerating statin, encouraged heart healthy diet, avoid trans fats, minimize simple carbs and saturated fats. Increase exercise as tolerated 

## 2020-04-21 NOTE — Assessment & Plan Note (Signed)
kegel exercise  myrbetriq sent to pharmacy

## 2020-04-21 NOTE — Patient Instructions (Signed)

## 2020-04-21 NOTE — Assessment & Plan Note (Signed)
Encourage pt to exercise and increase stamina  Con' t to watch diet

## 2020-04-21 NOTE — Assessment & Plan Note (Signed)
Well controlled, no changes to meds. Encouraged heart healthy diet such as the DASH diet and exercise as tolerated.  °

## 2020-04-22 ENCOUNTER — Other Ambulatory Visit: Payer: Self-pay

## 2020-04-22 MED ORDER — FENOFIBRATE 160 MG PO TABS: 160.0000 mg | ORAL_TABLET | Freq: Every day | ORAL | 2 refills | Status: DC

## 2020-04-23 DIAGNOSIS — H10413 Chronic giant papillary conjunctivitis, bilateral: Secondary | ICD-10-CM | POA: Diagnosis not present

## 2020-04-23 DIAGNOSIS — H16223 Keratoconjunctivitis sicca, not specified as Sjogren's, bilateral: Secondary | ICD-10-CM | POA: Diagnosis not present

## 2020-04-23 DIAGNOSIS — H04123 Dry eye syndrome of bilateral lacrimal glands: Secondary | ICD-10-CM | POA: Diagnosis not present

## 2020-04-23 LAB — DM TEMPLATE

## 2020-04-23 LAB — DRUG MONITORING, PANEL 8 WITH CONFIRMATION, URINE
6 Acetylmorphine: NEGATIVE ng/mL (ref <10)
Alcohol Metabolites: POSITIVE ng/mL — AB
Amphetamines: NEGATIVE ng/mL (ref <500)
Benzodiazepines: NEGATIVE ng/mL (ref <100)
Buprenorphine, Urine: NEGATIVE ng/mL (ref <5)
Cocaine Metabolite: NEGATIVE ng/mL (ref <150)
Creatinine: 54.3 mg/dL
Ethyl Glucuronide (ETG): 884 ng/mL — ABNORMAL HIGH (ref <500)
Ethyl Sulfate (ETS): 269 ng/mL — ABNORMAL HIGH (ref <100)
MDMA: NEGATIVE ng/mL (ref <500)
Marijuana Metabolite: NEGATIVE ng/mL (ref <20)
Opiates: NEGATIVE ng/mL (ref <100)
Oxidant: NEGATIVE ug/mL
Oxycodone: NEGATIVE ng/mL (ref <100)
pH: 6.4 (ref 4.5–9.0)

## 2020-05-06 NOTE — Progress Notes (Signed)
I connected with Livy today by telephone and verified that I am speaking with the correct person using two identifiers. Location patient: home Location provider: work Persons participating in the virtual visit: patient, Marine scientist.    I discussed the limitations, risks, security and privacy concerns of performing an evaluation and management service by telephone and the availability of in person appointments. I also discussed with the patient that there may be a patient responsible charge related to this service. The patient expressed understanding and verbally consented to this telephonic visit.    Interactive audio and video telecommunications were attempted between this RN and patient, however failed, due to patient having technical difficulties OR patient did not have access to video capability.  We continued and completed visit with audio only.  Some vital signs may be absent or patient reported.    Subjective:   Erin Acosta is a 69 y.o. female who presents for Medicare Annual (Subsequent) preventive examination.  Enjoys gardening.  Trip to [place] planned next month.  Review of Systems:  Home Safety/Smoke Alarms: Feels safe in home. Smoke alarms in place.  Lives alone in 1 story home.  Female:        Mammo- 12/06/19      Dexa scan-  declines      CCS- pt reports last done 2 yrs ago w/ 10 yr recall. Can't recall where or doctor's name.      Objective:     Vitals: Unable to assess. This visit is enabled though telemedicine due to Covid 19.   Advanced Directives 05/07/2020 06/02/2016  Does Patient Have a Medical Advance Directive? Yes Yes  Type of Paramedic of [place];Living will [place];Living will  Does patient want to make changes to medical advance directive? No - Patient declined No - Patient declined  Copy of [place] in Chart? No - copy requested No - copy requested    Tobacco Social History    Tobacco Use  Smoking Status Never Smoker  Smokeless Tobacco Never Used     Counseling given: Not Answered   Clinical Intake: Pain : No/denies pain     Past Medical History:  Diagnosis Date  . Allergy   . Anxiety   . Depression   . Genital herpes   . H/O cesarean section   . Hypertension   . Rotator cuff tear   . Scoliosis    Past Surgical History:  Procedure Laterality Date  . BILATERAL CARPAL TUNNEL RELEASE    . BLEPHAROPLASTY    . BREAST BIOPSY     unsure which breast  . CESAREAN SECTION    . FOOT SURGERY Right   . mini face lift    . TRIGGER FINGER RELEASE    . tummy tuck     Family History  Problem Relation Age of Onset  . Arthritis Other   . Hyperlipidemia Other   . Heart disease Other   . Stroke Other   . Hypertension Other    Social History   Socioeconomic History  . Marital status: Widowed    Spouse name: Not on file  . Number of children: Not on file  . Years of education: Not on file  . Highest education level: Not on file  Occupational History  . Not on file  Tobacco Use  . Smoking status: Never Smoker  . Smokeless tobacco: Never Used  Substance and Sexual Activity  . Alcohol use: Yes    Alcohol/week:  1.0 standard drink    Types: 1 Standard drinks or equivalent per week    Comment: Wine in the evening  . Drug use: No  . Sexual activity: Not on file  Other Topics Concern  . Not on file  Social History Narrative  . Not on file   Social Determinants of Health   Financial Resource Strain: Low Risk   . Difficulty of Paying Living Expenses: Not hard at all  Food Insecurity: No Food Insecurity  . Worried About Programme researcher, broadcasting/film/video in the Last Year: Never true  . Ran Out of Food in the Last Year: Never true  Transportation Needs: No Transportation Needs  . Lack of Transportation (Medical): No  . Lack of Transportation (Non-Medical): No  Physical Activity:   . Days of Exercise per Week:   . Minutes of Exercise per Session:    Stress:   . Feeling of Stress :   Social Connections:   . Frequency of Communication with Friends and Family:   . Frequency of Social Gatherings with Friends and Family:   . Attends Religious Services:   . Active Member of Clubs or Organizations:   . Attends Banker Meetings:   Marland Kitchen Marital Status:     Outpatient Encounter Medications as of 05/07/2020  Medication Sig  . aspirin EC 81 MG tablet Take 81 mg by mouth daily.  Marland Kitchen atorvastatin (LIPITOR) 20 MG tablet Take 1 tablet by mouth daily.  . cetirizine (ZYRTEC) 10 MG tablet Take 1 tablet (10 mg total) by mouth daily.  . clonazePAM (KLONOPIN) 1 MG tablet Take 1 tablet (1 mg total) by mouth daily as needed.  Marland Kitchen escitalopram (LEXAPRO) 10 MG tablet Take 1 tablet (10 mg total) by mouth daily.  . fenofibrate 160 MG tablet Take 1 tablet (160 mg total) by mouth daily.  . mirabegron ER (MYRBETRIQ) 50 MG TB24 tablet Take 1 tablet (50 mg total) by mouth daily.  Marland Kitchen olopatadine (PATANOL) 0.1 % ophthalmic solution   . Probiotic Product (PROBIOTIC DAILY PO) Take by mouth.  . spironolactone (ALDACTONE) 25 MG tablet Take 25 mg by mouth daily.  . valACYclovir (VALTREX) 500 MG tablet Take 1 tablet (500 mg total) by mouth daily.   No facility-administered encounter medications on file as of 05/07/2020.    Activities of Daily Living In your present state of health, do you have any difficulty performing the following activities: 05/07/2020 04/21/2020  Hearing? N N  Vision? N N  Difficulty concentrating or making decisions? N N  Walking or climbing stairs? N N  Dressing or bathing? N N  Doing errands, shopping? N N  Preparing Food and eating ? N -  Using the Toilet? N -  In the past six months, have you accidently leaked urine? N -  Do you have problems with loss of bowel control? N -  Managing your Medications? N -  Managing your Finances? N -  Housekeeping or managing your Housekeeping? N -  Some recent data might be hidden    Patient  Care Team: Zola Button, Grayling Congress, DO as PCP - General (Family Medicine) Malva Cogan, MD as Referring Physician (Dermatology) Blondell Reveal, MD (Obstetrics and Gynecology) Beverely Pace, Osborn Coho, MD as Referring Physician (Cardiology) Jodi Geralds, MD as Consulting Physician (Plastic Surgery)    Assessment:   This is a routine wellness examination for [place]. Physical assessment deferred to PCP.  Exercise Activities and Dietary recommendations Current Exercise Habits: Home exercise routine, Type  of exercise: walking, Time (Minutes): 30, Frequency (Times/Week): 3, Weekly Exercise (Minutes/Week): 90, Intensity: Mild, Exercise limited by: None identified   Diet (meal preparation, eat out, water intake, caffeinated beverages, dairy products, fruits and vegetables): in general, a "healthy" diet  , well balanced   Goals    . Maintain healthy active lifestyle.       Fall Risk Fall Risk  05/07/2020 04/21/2020 05/10/2018 10/26/2017 06/02/2016  Falls in the past year? 0 0 No No No  Comment - - - Emmi Telephone Survey: data to providers prior to load -  Number falls in past yr: 0 0 - - -  Injury with Fall? 0 0 - - -  Follow up Education provided;Falls prevention discussed Falls evaluation completed - - -    Depression Screen PHQ 2/9 Scores 05/07/2020 04/21/2020 10/29/2019 06/14/2019  PHQ - 2 Score 0 1 0 4  PHQ- 9 Score - - - 14     Cognitive Function Ad8 score reviewed for issues:  Issues making decisions:no  Less interest in hobbies / activities:no  Repeats questions, stories (family complaining):no  Trouble using ordinary gadgets (microwave, computer, phone):no  Forgets the month or year: no  Mismanaging finances: no  Remembering appts:no  Daily problems with thinking and/or memory:no Ad8 score is=0       Immunization History  Administered Date(s) Administered  . Fluad Quad(high Dose 65+) 07/15/2019  . Influenza-Unspecified 08/05/2016, 09/18/2017  . PFIZER  SARS-COV-2 Vaccination 01/25/2020  . Pneumococcal Conjugate-13 06/02/2016  . Zoster 11/21/2013  . Zoster Recombinat (Shingrix) 02/16/2017, 05/19/2017    Screening Tests Health Maintenance  Topic Date Due  . URINE MICROALBUMIN  Never done  . TETANUS/TDAP  Never done  . COLONOSCOPY  Never done  . DEXA SCAN  Never done  . PNA vac Low Risk Adult (2 of 2 - PPSV23) 06/02/2017  . COVID-19 Vaccine (2 - Pfizer 2-dose series) 02/15/2020  . INFLUENZA VACCINE  06/21/2020  . MAMMOGRAM  06/23/2021  . Hepatitis C Screening  Completed      Plan:    Please schedule your next medicare wellness visit with me in 1 yr.  Continue to eat heart healthy diet (full of fruits, vegetables, whole grains, lean protein, water--limit salt, fat, and sugar intake) and increase physical activity as tolerated.  Continue doing brain stimulating activities (puzzles, reading, adult coloring books, staying active) to keep memory sharp.   Bring a copy of your living will and/or healthcare power of attorney to your next office visit.   I have personally reviewed and noted the following in the patient's chart:   . Medical and social history . Use of alcohol, tobacco or illicit drugs  . Current medications and supplements . Functional ability and status . Nutritional status . Physical activity . Advanced directives . List of other physicians . Hospitalizations, surgeries, and ER visits in previous 12 months . Vitals . Screenings to include cognitive, depression, and falls . Referrals and appointments  In addition, I have reviewed and discussed with patient certain preventive protocols, quality metrics, and best practice recommendations. A written personalized care plan for preventive services as well as general preventive health recommendations were provided to patient.   Due to this being a telephonic visit, the after visit summary with patients personalized plan was offered to patient via mail or my-chart.  Patient would like to access on my-chart.  [place]on Gully, [place]  05/07/2020

## 2020-05-07 ENCOUNTER — Other Ambulatory Visit: Payer: Self-pay

## 2020-05-07 ENCOUNTER — Encounter: Payer: Self-pay | Admitting: *Deleted

## 2020-05-07 ENCOUNTER — Ambulatory Visit (INDEPENDENT_AMBULATORY_CARE_PROVIDER_SITE_OTHER): Payer: Medicare Other | Admitting: *Deleted

## 2020-05-07 DIAGNOSIS — Z0000 Encounter for general adult medical examination without abnormal findings: Secondary | ICD-10-CM | POA: Diagnosis not present

## 2020-05-07 NOTE — Patient Instructions (Signed)
Please schedule your next medicare wellness visit with me in 1 yr.  Continue to eat heart healthy diet (full of fruits, vegetables, whole grains, lean protein, water--limit salt, fat, and sugar intake) and increase physical activity as tolerated.  Continue doing brain stimulating activities (puzzles, reading, adult coloring books, staying active) to keep memory sharp.   Bring a copy of your living will and/or healthcare power of attorney to your next office visit.   [name] , Thank you for taking time to come for your Medicare Wellness Visit. I appreciate your ongoing commitment to your health goals. Please review the following plan we discussed and let me know if I can assist you in the future.   These are the goals we discussed: Goals    . Maintain healthy active lifestyle.       This is a list of the screening recommended for you and due dates:  Health Maintenance  Topic Date Due  . Urine Protein Check  Never done  . Tetanus Vaccine  Never done  . Colon Cancer Screening  Never done  . DEXA scan (bone density measurement)  Never done  . Pneumonia vaccines (2 of 2 - PPSV23) 06/02/2017  . COVID-19 Vaccine (2 - Pfizer 2-dose series) 02/15/2020  . Flu Shot  06/21/2020  . Mammogram  06/23/2021  .  Hepatitis C: One time screening is recommended by Center for Disease Control  (CDC) for  adults born from [birth year] through 1965.   Completed    Preventive Care 44 Years and Older, Female Preventive care refers to lifestyle choices and visits with your health care provider that can promote health and wellness. This includes:  A yearly physical exam. This is also called an annual well check.  Regular dental and eye exams.  Immunizations.  Screening for certain conditions.  Healthy lifestyle choices, such as diet and exercise. What can I expect for my preventive care visit? Physical exam Your health care provider will check:  Height and weight. These may be used to calculate body  mass index (BMI), which is a measurement that tells if you are at a healthy weight.  Heart rate and blood pressure.  Your skin for abnormal spots. Counseling Your health care provider may ask you questions about:  Alcohol, tobacco, and drug use.  Emotional well-being.  Home and relationship well-being.  Sexual activity.  Eating habits.  History of falls.  Memory and ability to understand (cognition).  Work and work Statistician.  Pregnancy and menstrual history. What immunizations do I need?  Influenza (flu) vaccine  This is recommended every year. Tetanus, diphtheria, and pertussis (Tdap) vaccine  You may need a Td booster every 10 years. Varicella (chickenpox) vaccine  You may need this vaccine if you have not already been vaccinated. Zoster (shingles) vaccine  You may need this after age 51. Pneumococcal conjugate (PCV13) vaccine  One dose is recommended after age 70. Pneumococcal polysaccharide (PPSV23) vaccine  One dose is recommended after age 25. Measles, mumps, and rubella (MMR) vaccine  You may need at least one dose of MMR if you were born in 1957 or later. You may also need a second dose. Meningococcal conjugate (MenACWY) vaccine  You may need this if you have certain conditions. Hepatitis A vaccine  You may need this if you have certain conditions or if you travel or work in places where you may be exposed to hepatitis A. Hepatitis B vaccine  You may need this if you have certain conditions or if  you travel or work in places where you may be exposed to hepatitis B. Haemophilus influenzae type b (Hib) vaccine  You may need this if you have certain conditions. You may receive vaccines as individual doses or as more than one vaccine together in one shot (combination vaccines). Talk with your health care provider about the risks and benefits of combination vaccines. What tests do I need? Blood tests  Lipid and cholesterol levels. These may be  checked every 5 years, or more frequently depending on your overall health.  Hepatitis C test.  Hepatitis B test. Screening  Lung cancer screening. You may have this screening every year starting at age 95 if you have a 30-pack-year history of smoking and currently smoke or have quit within the past 15 years.  Colorectal cancer screening. All adults should have this screening starting at age 65 and continuing until age 5. Your health care provider may recommend screening at age 67 if you are at increased risk. You will have tests every 1-10 years, depending on your results and the type of screening test.  Diabetes screening. This is done by checking your blood sugar (glucose) after you have not eaten for a while (fasting). You may have this done every 1-3 years.  Mammogram. This may be done every 1-2 years. Talk with your health care provider about how often you should have regular mammograms.  BRCA-related cancer screening. This may be done if you have a family history of breast, ovarian, tubal, or peritoneal cancers. Other tests  Sexually transmitted disease (STD) testing.  Bone density scan. This is done to screen for osteoporosis. You may have this done starting at age 43. Follow these instructions at home: Eating and drinking  Eat a diet that includes fresh fruits and vegetables, whole grains, lean protein, and low-fat dairy products. Limit your intake of foods with high amounts of sugar, saturated fats, and salt.  Take vitamin and mineral supplements as recommended by your health care provider.  Do not drink alcohol if your health care provider tells you not to drink.  If you drink alcohol: ? Limit how much you have to 0-1 drink a day. ? Be aware of how much alcohol is in your drink. In the [place] one drink equals one 12 oz bottle of beer (355 mL), one 5 oz glass of wine (148 mL), or one 1 oz glass of hard liquor (44 mL). Lifestyle  Take daily care of your teeth and  gums.  Stay active. Exercise for at least 30 minutes on 5 or more days each week.  Do not use any products that contain nicotine or tobacco, such as cigarettes, e-cigarettes, and chewing tobacco. If you need help quitting, ask your health care provider.  If you are sexually active, practice safe sex. Use a condom or other form of protection in order to prevent STIs (sexually transmitted infections).  Talk with your health care provider about taking a low-dose aspirin or statin. What's next?  Go to your health care provider once a year for a well check visit.  Ask your health care provider how often you should have your eyes and teeth checked.  Stay up to date on all vaccines. This information is not intended to replace advice given to you by your health care provider. Make sure you discuss any questions you have with your health care provider. Document Revised: 11/01/2018 Document Reviewed: 11/01/2018 Elsevier Patient Education  2020 [employer].

## 2020-05-26 ENCOUNTER — Other Ambulatory Visit (HOSPITAL_BASED_OUTPATIENT_CLINIC_OR_DEPARTMENT_OTHER): Payer: Self-pay | Admitting: Family Medicine

## 2020-05-26 DIAGNOSIS — Z1231 Encounter for screening mammogram for malignant neoplasm of breast: Secondary | ICD-10-CM

## 2020-06-02 ENCOUNTER — Telehealth: Payer: Medicare Other | Admitting: Family Medicine

## 2020-06-05 ENCOUNTER — Telehealth: Payer: Medicare Other | Admitting: Family Medicine

## 2020-06-11 DIAGNOSIS — L700 Acne vulgaris: Secondary | ICD-10-CM | POA: Diagnosis not present

## 2020-06-16 DIAGNOSIS — M47814 Spondylosis without myelopathy or radiculopathy, thoracic region: Secondary | ICD-10-CM | POA: Diagnosis not present

## 2020-06-16 DIAGNOSIS — M47812 Spondylosis without myelopathy or radiculopathy, cervical region: Secondary | ICD-10-CM | POA: Diagnosis not present

## 2020-06-16 DIAGNOSIS — M9901 Segmental and somatic dysfunction of cervical region: Secondary | ICD-10-CM | POA: Diagnosis not present

## 2020-06-16 DIAGNOSIS — M542 Cervicalgia: Secondary | ICD-10-CM | POA: Diagnosis not present

## 2020-06-17 DIAGNOSIS — M47812 Spondylosis without myelopathy or radiculopathy, cervical region: Secondary | ICD-10-CM | POA: Diagnosis not present

## 2020-06-17 DIAGNOSIS — M542 Cervicalgia: Secondary | ICD-10-CM | POA: Diagnosis not present

## 2020-06-17 DIAGNOSIS — M47814 Spondylosis without myelopathy or radiculopathy, thoracic region: Secondary | ICD-10-CM | POA: Diagnosis not present

## 2020-06-17 DIAGNOSIS — M9901 Segmental and somatic dysfunction of cervical region: Secondary | ICD-10-CM | POA: Diagnosis not present

## 2020-06-22 DIAGNOSIS — M25531 Pain in right wrist: Secondary | ICD-10-CM | POA: Diagnosis not present

## 2020-06-22 DIAGNOSIS — D692 Other nonthrombocytopenic purpura: Secondary | ICD-10-CM | POA: Diagnosis not present

## 2020-06-24 DIAGNOSIS — E789 Disorder of lipoprotein metabolism, unspecified: Secondary | ICD-10-CM | POA: Diagnosis not present

## 2020-06-24 DIAGNOSIS — I771 Stricture of artery: Secondary | ICD-10-CM | POA: Diagnosis not present

## 2020-06-24 DIAGNOSIS — M47814 Spondylosis without myelopathy or radiculopathy, thoracic region: Secondary | ICD-10-CM | POA: Diagnosis not present

## 2020-06-24 DIAGNOSIS — M47812 Spondylosis without myelopathy or radiculopathy, cervical region: Secondary | ICD-10-CM | POA: Diagnosis not present

## 2020-06-24 DIAGNOSIS — M542 Cervicalgia: Secondary | ICD-10-CM | POA: Diagnosis not present

## 2020-06-24 DIAGNOSIS — I2510 Atherosclerotic heart disease of native coronary artery without angina pectoris: Secondary | ICD-10-CM | POA: Diagnosis not present

## 2020-06-24 DIAGNOSIS — M9901 Segmental and somatic dysfunction of cervical region: Secondary | ICD-10-CM | POA: Diagnosis not present

## 2020-06-25 DIAGNOSIS — H16223 Keratoconjunctivitis sicca, not specified as Sjogren's, bilateral: Secondary | ICD-10-CM | POA: Diagnosis not present

## 2020-06-25 DIAGNOSIS — H5213 Myopia, bilateral: Secondary | ICD-10-CM | POA: Diagnosis not present

## 2020-06-25 DIAGNOSIS — H524 Presbyopia: Secondary | ICD-10-CM | POA: Diagnosis not present

## 2020-06-25 DIAGNOSIS — H2513 Age-related nuclear cataract, bilateral: Secondary | ICD-10-CM | POA: Diagnosis not present

## 2020-06-25 DIAGNOSIS — H52223 Regular astigmatism, bilateral: Secondary | ICD-10-CM | POA: Diagnosis not present

## 2020-06-25 DIAGNOSIS — H04123 Dry eye syndrome of bilateral lacrimal glands: Secondary | ICD-10-CM | POA: Diagnosis not present

## 2020-06-29 DIAGNOSIS — M47814 Spondylosis without myelopathy or radiculopathy, thoracic region: Secondary | ICD-10-CM | POA: Diagnosis not present

## 2020-06-29 DIAGNOSIS — M47812 Spondylosis without myelopathy or radiculopathy, cervical region: Secondary | ICD-10-CM | POA: Diagnosis not present

## 2020-06-29 DIAGNOSIS — M542 Cervicalgia: Secondary | ICD-10-CM | POA: Diagnosis not present

## 2020-06-29 DIAGNOSIS — M9901 Segmental and somatic dysfunction of cervical region: Secondary | ICD-10-CM | POA: Diagnosis not present

## 2020-07-09 DIAGNOSIS — M9901 Segmental and somatic dysfunction of cervical region: Secondary | ICD-10-CM | POA: Diagnosis not present

## 2020-07-09 DIAGNOSIS — M47814 Spondylosis without myelopathy or radiculopathy, thoracic region: Secondary | ICD-10-CM | POA: Diagnosis not present

## 2020-07-09 DIAGNOSIS — M542 Cervicalgia: Secondary | ICD-10-CM | POA: Diagnosis not present

## 2020-07-09 DIAGNOSIS — M47812 Spondylosis without myelopathy or radiculopathy, cervical region: Secondary | ICD-10-CM | POA: Diagnosis not present

## 2020-07-14 ENCOUNTER — Other Ambulatory Visit: Payer: Self-pay

## 2020-07-14 ENCOUNTER — Encounter (HOSPITAL_BASED_OUTPATIENT_CLINIC_OR_DEPARTMENT_OTHER): Payer: Self-pay

## 2020-07-14 ENCOUNTER — Ambulatory Visit (HOSPITAL_BASED_OUTPATIENT_CLINIC_OR_DEPARTMENT_OTHER)
Admission: RE | Admit: 2020-07-14 | Discharge: 2020-07-14 | Disposition: A | Discharge status: Home / Self Care | Payer: Medicare Other | Source: Ambulatory Visit | Attending: Family Medicine | Admitting: Family Medicine

## 2020-07-14 DIAGNOSIS — Z1231 Encounter for screening mammogram for malignant neoplasm of breast: Secondary | ICD-10-CM | POA: Diagnosis not present

## 2020-07-15 ENCOUNTER — Telehealth (INDEPENDENT_AMBULATORY_CARE_PROVIDER_SITE_OTHER): Payer: Medicare Other | Admitting: Family Medicine

## 2020-07-15 ENCOUNTER — Encounter: Payer: Self-pay | Admitting: Family Medicine

## 2020-07-15 VITALS — Ht 60.0 in | Wt 141.0 lb

## 2020-07-15 DIAGNOSIS — J0140 Acute pansinusitis, unspecified: Secondary | ICD-10-CM

## 2020-07-15 DIAGNOSIS — T148XXA Other injury of unspecified body region, initial encounter: Secondary | ICD-10-CM | POA: Diagnosis not present

## 2020-07-15 MED ORDER — AMOXICILLIN-POT CLAVULANATE 875-125 MG PO TABS: 1.0000 | ORAL_TABLET | Freq: Two times a day (BID) | ORAL | 0 refills | Status: DC

## 2020-07-15 NOTE — Progress Notes (Signed)
Virtual Visit via Video Note  I connected with Erin Acosta on 07/15/20 at  8:20 AM EDT by a video enabled telemedicine application and verified that I am speaking with the correct person using two identifiers.  Location: Patient: home alone  Provider: home    I discussed the limitations of evaluation and management by telemedicine and the availability of in person appointments. The patient expressed understanding and agreed to proceed.  History of Present Illness: Pt is home alone c/o congestion since last Thursday.  + ears aching and sinus pressure   No fever   She had a sore throat but that is gone.  Pt is not taking anything.   very little coughing  Pt also c/o cyst on L wrist -- ortho looked at it but there is bruising around it and he wanted her to have it checked before cyst removed   No bruising anywhere else  Observations/Objective: Wt 141 lbs    t 97. Pt is in N[place] but sounds congested   Assessment and Plan: 1. Acute non-recurrent pansinusitis abx per orders---- use the flonase and cont zyrtec  Pt will schedule covid test  - amoxicillin-clavulanate (AUGMENTIN) 875-125 MG tablet; Take 1 tablet by mouth 2 (two) times daily.  Dispense: 20 tablet; Refill: 0  2, bruising---  Pt will come into office if covid neg for eval and labs  Follow Up Instructions:    I discussed the assessment and treatment plan with the patient. The patient was provided an opportunity to ask questions and all were answered. The patient agreed with the plan and demonstrated an understanding of the instructions.   The patient was advised to call back or seek an in-person evaluation if the symptoms worsen or if the condition fails to improve as anticipated.  I provided 25 minutes of non-face-to-face time during this encounter.   [name], DO

## 2020-07-16 DIAGNOSIS — M47812 Spondylosis without myelopathy or radiculopathy, cervical region: Secondary | ICD-10-CM | POA: Diagnosis not present

## 2020-07-16 DIAGNOSIS — M9901 Segmental and somatic dysfunction of cervical region: Secondary | ICD-10-CM | POA: Diagnosis not present

## 2020-07-16 DIAGNOSIS — M47814 Spondylosis without myelopathy or radiculopathy, thoracic region: Secondary | ICD-10-CM | POA: Diagnosis not present

## 2020-07-16 DIAGNOSIS — M542 Cervicalgia: Secondary | ICD-10-CM | POA: Diagnosis not present

## 2020-07-16 IMAGING — MG MM DIGITAL DIAGNOSTIC UNILAT*R* W/ TOMO W/ CAD
6 series · 6 of 18 positions shown · non-contrast
Comparison: Previous exam(s).

CLINICAL DATA: Patient presents for focal tenderness within the
right breast.

EXAM:
DIGITAL DIAGNOSTIC RIGHT MAMMOGRAM WITH CAD AND TOMO
ULTRASOUND RIGHT BREAST

[R MLO synth-2D]
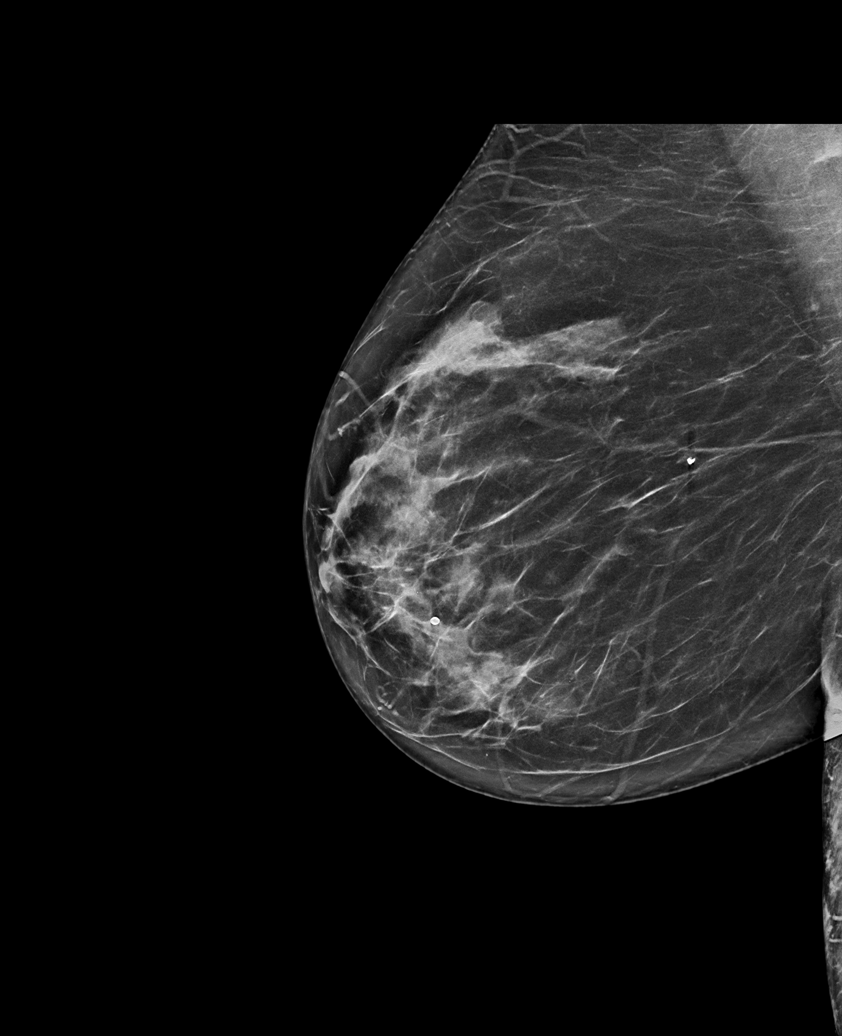

[R TAN synth-2D]
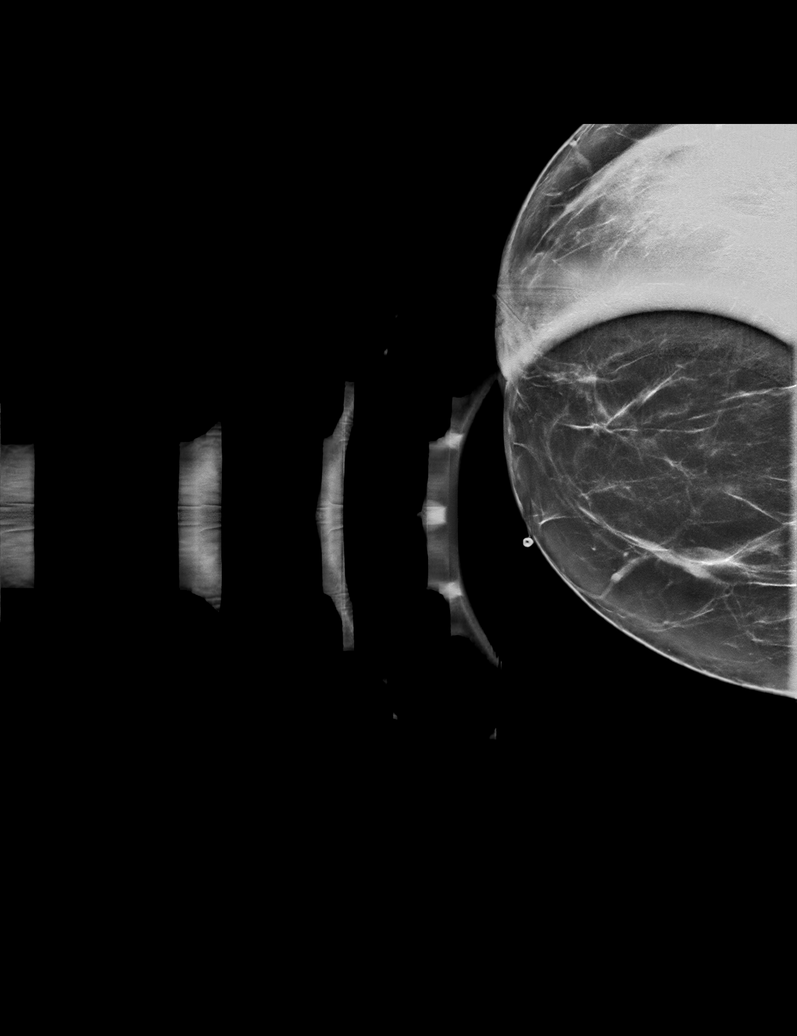

[R CC synth-2D]
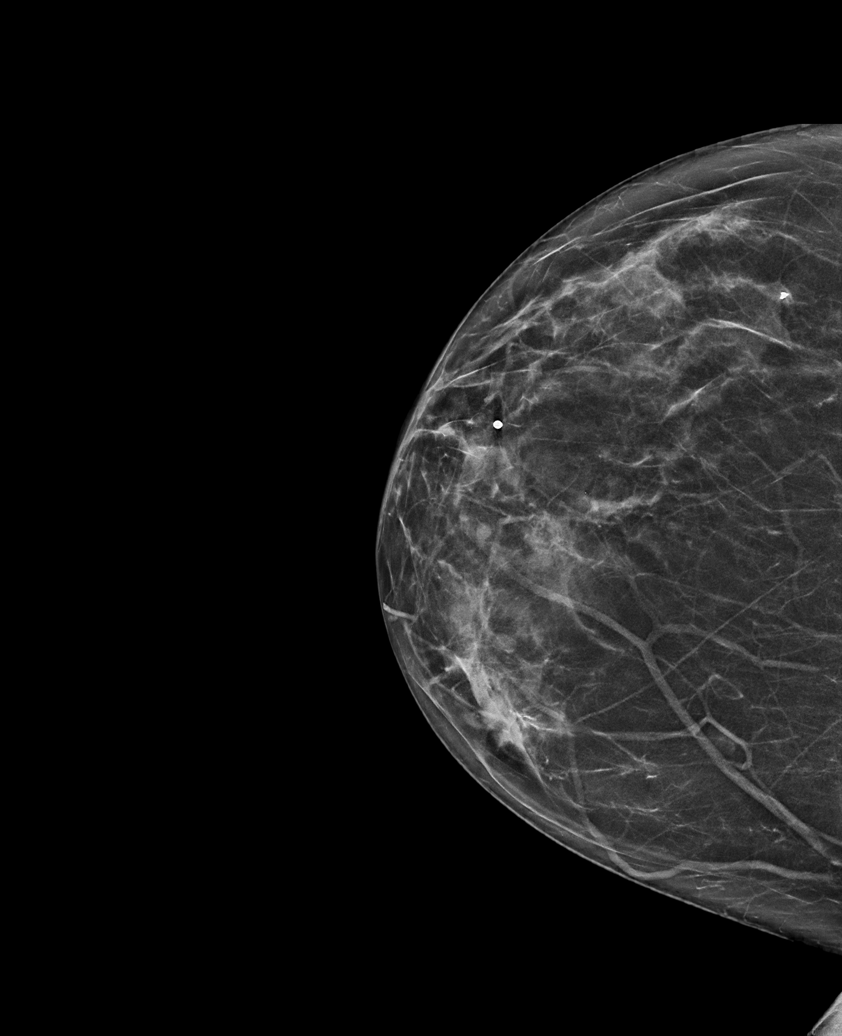

[R MLO tomo · tomo slice 40/79.0]
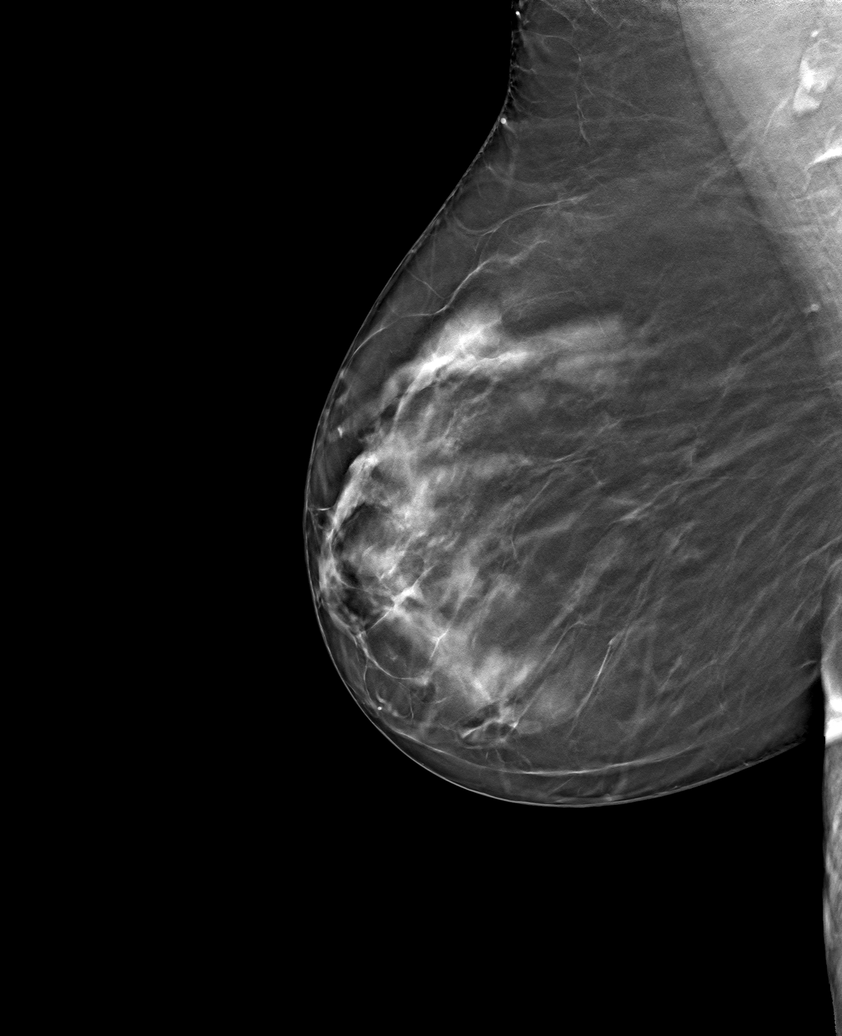

[R TAN tomo · tomo slice 34/67.0]
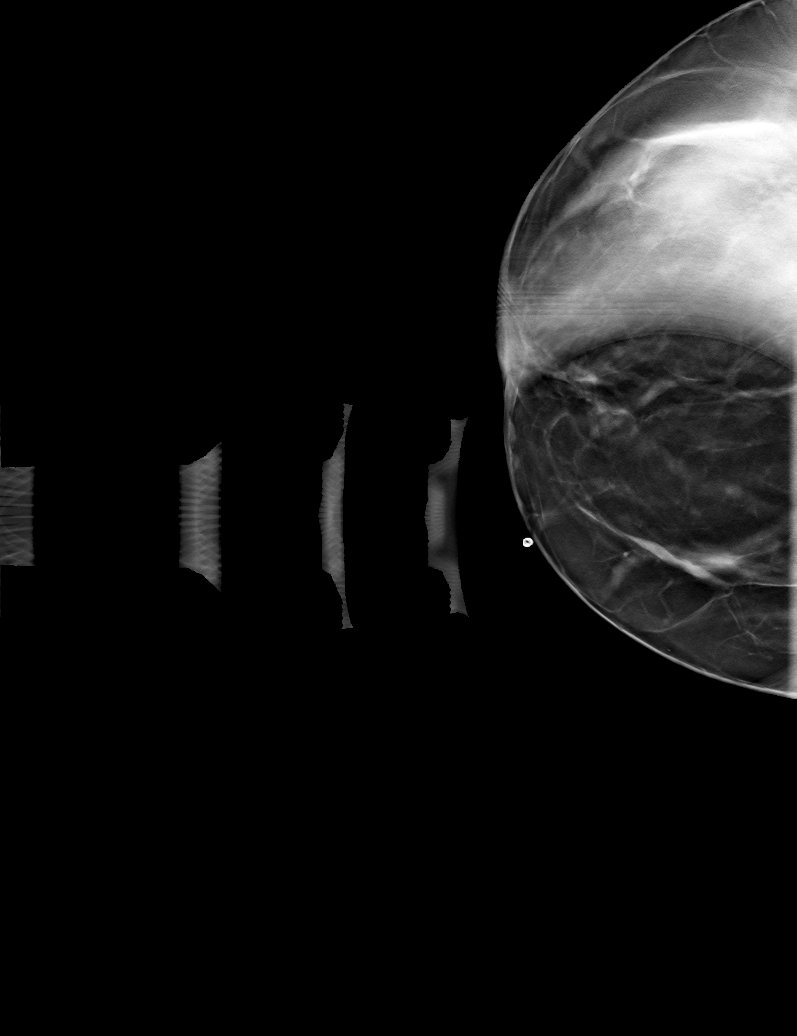

[R CC tomo · tomo slice 33/65.0]
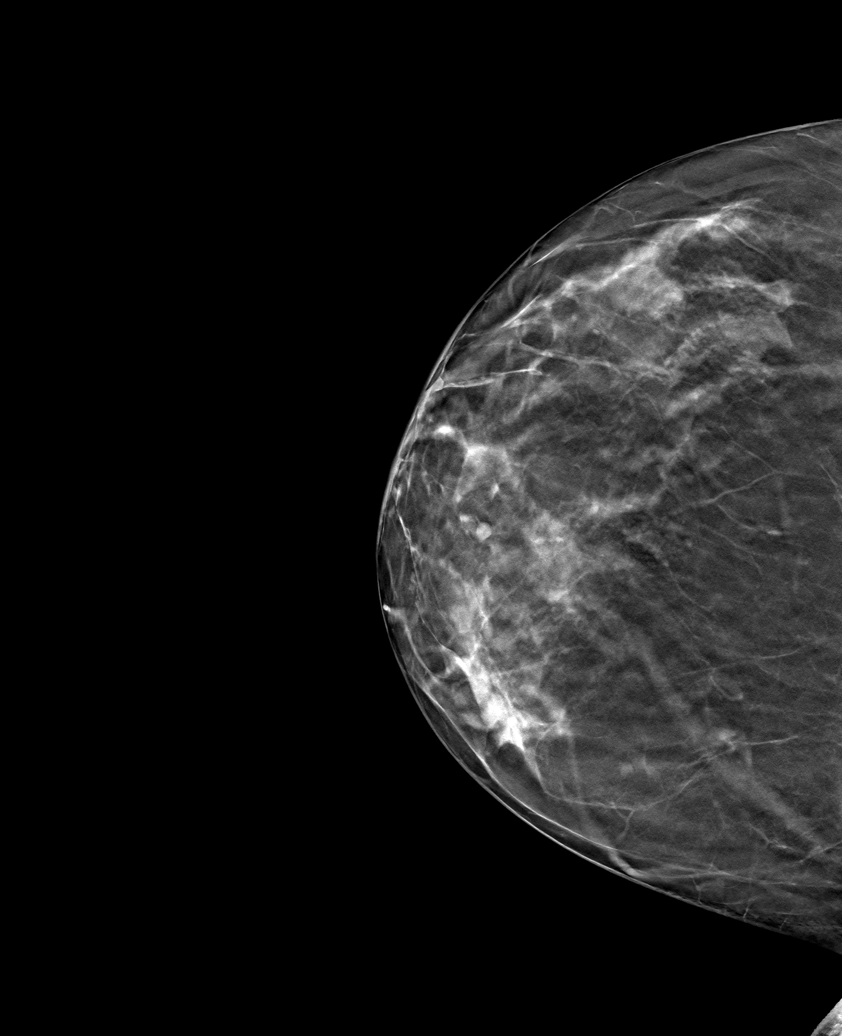

[6 of 18 positions shown; findings below may reference images not displayed]

ACR Breast Density Category c: The breast tissue is heterogeneously
dense, which may obscure small masses.
FINDINGS: No concerning masses, calcifications or distortion identified within
the right breast. Specifically, no concerning abnormality underlying
the palpable marker within the anterior right breast.

Mammographic images were processed with CAD.

Targeted ultrasound is performed, showing normal tissue without
suspicious mass within the right breast 6 o'clock position at the
site of focal tenderness.
IMPRESSION: No mammographic evidence for malignancy.

RECOMMENDATION:
Continued clinical evaluation for right breast tenderness.

Return to annual screening mammography [DATE]

I have discussed the findings and recommendations with the patient.
If applicable, a reminder letter will be sent to the patient
regarding the next appointment.

BI-RADS CATEGORY  2: Benign.

## 2020-07-20 DIAGNOSIS — M47812 Spondylosis without myelopathy or radiculopathy, cervical region: Secondary | ICD-10-CM | POA: Diagnosis not present

## 2020-07-20 DIAGNOSIS — M9901 Segmental and somatic dysfunction of cervical region: Secondary | ICD-10-CM | POA: Diagnosis not present

## 2020-07-20 DIAGNOSIS — M47814 Spondylosis without myelopathy or radiculopathy, thoracic region: Secondary | ICD-10-CM | POA: Diagnosis not present

## 2020-07-20 DIAGNOSIS — M542 Cervicalgia: Secondary | ICD-10-CM | POA: Diagnosis not present

## 2020-08-10 DIAGNOSIS — M542 Cervicalgia: Secondary | ICD-10-CM | POA: Diagnosis not present

## 2020-08-10 DIAGNOSIS — M47812 Spondylosis without myelopathy or radiculopathy, cervical region: Secondary | ICD-10-CM | POA: Diagnosis not present

## 2020-08-10 DIAGNOSIS — M47814 Spondylosis without myelopathy or radiculopathy, thoracic region: Secondary | ICD-10-CM | POA: Diagnosis not present

## 2020-08-10 DIAGNOSIS — M9901 Segmental and somatic dysfunction of cervical region: Secondary | ICD-10-CM | POA: Diagnosis not present

## 2020-08-12 DIAGNOSIS — M79671 Pain in right foot: Secondary | ICD-10-CM | POA: Diagnosis not present

## 2020-08-12 DIAGNOSIS — M2041 Other hammer toe(s) (acquired), right foot: Secondary | ICD-10-CM | POA: Diagnosis not present

## 2020-08-12 DIAGNOSIS — M2021 Hallux rigidus, right foot: Secondary | ICD-10-CM | POA: Diagnosis not present

## 2020-08-13 DIAGNOSIS — M47814 Spondylosis without myelopathy or radiculopathy, thoracic region: Secondary | ICD-10-CM | POA: Diagnosis not present

## 2020-08-13 DIAGNOSIS — M542 Cervicalgia: Secondary | ICD-10-CM | POA: Diagnosis not present

## 2020-08-13 DIAGNOSIS — M47812 Spondylosis without myelopathy or radiculopathy, cervical region: Secondary | ICD-10-CM | POA: Diagnosis not present

## 2020-08-13 DIAGNOSIS — M9901 Segmental and somatic dysfunction of cervical region: Secondary | ICD-10-CM | POA: Diagnosis not present

## 2020-08-17 DIAGNOSIS — M47812 Spondylosis without myelopathy or radiculopathy, cervical region: Secondary | ICD-10-CM | POA: Diagnosis not present

## 2020-08-17 DIAGNOSIS — M9901 Segmental and somatic dysfunction of cervical region: Secondary | ICD-10-CM | POA: Diagnosis not present

## 2020-08-17 DIAGNOSIS — M542 Cervicalgia: Secondary | ICD-10-CM | POA: Diagnosis not present

## 2020-08-17 DIAGNOSIS — M47814 Spondylosis without myelopathy or radiculopathy, thoracic region: Secondary | ICD-10-CM | POA: Diagnosis not present

## 2020-08-20 DIAGNOSIS — M47812 Spondylosis without myelopathy or radiculopathy, cervical region: Secondary | ICD-10-CM | POA: Diagnosis not present

## 2020-08-20 DIAGNOSIS — M9901 Segmental and somatic dysfunction of cervical region: Secondary | ICD-10-CM | POA: Diagnosis not present

## 2020-08-20 DIAGNOSIS — M542 Cervicalgia: Secondary | ICD-10-CM | POA: Diagnosis not present

## 2020-08-20 DIAGNOSIS — M47814 Spondylosis without myelopathy or radiculopathy, thoracic region: Secondary | ICD-10-CM | POA: Diagnosis not present

## 2020-08-27 DIAGNOSIS — M542 Cervicalgia: Secondary | ICD-10-CM | POA: Diagnosis not present

## 2020-08-27 DIAGNOSIS — M47812 Spondylosis without myelopathy or radiculopathy, cervical region: Secondary | ICD-10-CM | POA: Diagnosis not present

## 2020-08-27 DIAGNOSIS — M9901 Segmental and somatic dysfunction of cervical region: Secondary | ICD-10-CM | POA: Diagnosis not present

## 2020-08-27 DIAGNOSIS — M47814 Spondylosis without myelopathy or radiculopathy, thoracic region: Secondary | ICD-10-CM | POA: Diagnosis not present

## 2020-09-03 DIAGNOSIS — M205X1 Other deformities of toe(s) (acquired), right foot: Secondary | ICD-10-CM | POA: Diagnosis not present

## 2020-09-03 DIAGNOSIS — M2041 Other hammer toe(s) (acquired), right foot: Secondary | ICD-10-CM | POA: Diagnosis not present

## 2020-09-03 DIAGNOSIS — M2021 Hallux rigidus, right foot: Secondary | ICD-10-CM | POA: Diagnosis not present

## 2020-09-03 DIAGNOSIS — M25571 Pain in right ankle and joints of right foot: Secondary | ICD-10-CM | POA: Diagnosis not present

## 2020-09-07 ENCOUNTER — Other Ambulatory Visit: Payer: Self-pay | Admitting: Family Medicine

## 2020-09-07 ENCOUNTER — Telehealth: Payer: Self-pay | Admitting: Family Medicine

## 2020-09-07 DIAGNOSIS — N760 Acute vaginitis: Secondary | ICD-10-CM

## 2020-09-07 MED ORDER — FLUCONAZOLE 150 MG PO TABS: ORAL_TABLET | ORAL | 0 refills | Status: DC

## 2020-09-07 NOTE — Telephone Encounter (Signed)
Patient states she would like a prescription for yeast infection. She would liken the pill. Patient refuse appointment. She states she had surgery last thursday  Karin Golden [facility] [address] [place], [place] - [address]  [address], [place] [address]  Phone:  [phone] Fax:  [phone]

## 2020-09-07 NOTE — Telephone Encounter (Signed)
Pt advised that medication was sent in 

## 2020-09-07 NOTE — Telephone Encounter (Signed)
Please advise 

## 2020-09-07 NOTE — Telephone Encounter (Signed)
Diflucan sent in

## 2020-09-09 DIAGNOSIS — M2041 Other hammer toe(s) (acquired), right foot: Secondary | ICD-10-CM | POA: Diagnosis not present

## 2020-09-09 DIAGNOSIS — M2021 Hallux rigidus, right foot: Secondary | ICD-10-CM | POA: Diagnosis not present

## 2020-10-01 DIAGNOSIS — M2041 Other hammer toe(s) (acquired), right foot: Secondary | ICD-10-CM | POA: Diagnosis not present

## 2020-10-01 DIAGNOSIS — M2021 Hallux rigidus, right foot: Secondary | ICD-10-CM | POA: Diagnosis not present

## 2020-10-14 ENCOUNTER — Other Ambulatory Visit: Payer: Self-pay | Admitting: Family Medicine

## 2020-10-14 DIAGNOSIS — F419 Anxiety disorder, unspecified: Secondary | ICD-10-CM

## 2020-10-20 DIAGNOSIS — M2021 Hallux rigidus, right foot: Secondary | ICD-10-CM | POA: Diagnosis not present

## 2020-10-20 DIAGNOSIS — M2041 Other hammer toe(s) (acquired), right foot: Secondary | ICD-10-CM | POA: Diagnosis not present

## 2020-10-27 ENCOUNTER — Ambulatory Visit: Payer: Medicare Other | Admitting: Family Medicine

## 2020-12-10 ENCOUNTER — Telehealth: Payer: Self-pay | Admitting: Family Medicine

## 2020-12-10 NOTE — Telephone Encounter (Signed)
Caller: Sam - [facility]  Call Back @ [phone]  Sam called in reference to prior authorization request faxed to our office for patient's back brace and bilateral leg braces.   Please Advise if we received the paper work for authorization

## 2020-12-10 NOTE — Telephone Encounter (Signed)
Received. Given to [place] °

## 2020-12-11 NOTE — Telephone Encounter (Signed)
Spoke with patient. Patient states not interested in supplies. FYI

## 2020-12-11 NOTE — Telephone Encounter (Signed)
Caller: Sam ([facility]) Call back # [phone]  Checking the status of form

## 2020-12-11 NOTE — Telephone Encounter (Signed)
That's what I thought

## 2020-12-14 NOTE — Telephone Encounter (Signed)
Sam With [facility] called to check the status on the prior auth  For back craces and knee braces   Sam is requesting a call back @ [phone]

## 2020-12-15 ENCOUNTER — Telehealth: Payer: Self-pay | Admitting: *Deleted

## 2020-12-15 NOTE — Telephone Encounter (Signed)
Spoke with patient about [facility] form and she stated that she declined their services for back and both knee braces.  Sent fax back advising them to take her out of their system and they we were not signing form.

## 2020-12-30 DIAGNOSIS — M75101 Unspecified rotator cuff tear or rupture of right shoulder, not specified as traumatic: Secondary | ICD-10-CM | POA: Diagnosis not present

## 2021-01-04 ENCOUNTER — Other Ambulatory Visit: Payer: Self-pay | Admitting: Family Medicine

## 2021-01-04 DIAGNOSIS — F411 Generalized anxiety disorder: Secondary | ICD-10-CM

## 2021-01-04 MED ORDER — CLONAZEPAM 1 MG PO TABS: 1.0000 mg | ORAL_TABLET | Freq: Every day | ORAL | 0 refills | Status: DC | PRN

## 2021-01-04 NOTE — Telephone Encounter (Signed)
Requesting: Klonopin Contract: 04/21/2020 UDS: 04/21/20 Last OV: 07/15/2020 Next OV: N/A Last Refill: 04/21/2020, #90--0 RF  Database:   Please advise

## 2021-01-04 NOTE — Telephone Encounter (Signed)
Medication: clonazePAM (KLONOPIN) 1 MG tablet [[id number]]    Has the patient contacted their pharmacy? No. (If no, request that the patient contact the pharmacy for the refill.) (If yes, when and what did the pharmacy advise?)  Preferred Pharmacy (with phone number or street name):  [facility] - [place], [place] - [address]  [address], [place] [address]  Phone:  [phone] Fax:  [phone]  DEA #:  --  DAW Reason: --       Agent: Please be advised that RX refills may take up to 3 business days. We ask that you follow-up with your pharmacy.

## 2021-01-18 DIAGNOSIS — H04123 Dry eye syndrome of bilateral lacrimal glands: Secondary | ICD-10-CM | POA: Diagnosis not present

## 2021-01-18 DIAGNOSIS — R2231 Localized swelling, mass and lump, right upper limb: Secondary | ICD-10-CM | POA: Diagnosis not present

## 2021-01-18 DIAGNOSIS — M25531 Pain in right wrist: Secondary | ICD-10-CM | POA: Diagnosis not present

## 2021-01-18 DIAGNOSIS — G514 Facial myokymia: Secondary | ICD-10-CM | POA: Diagnosis not present

## 2021-01-18 DIAGNOSIS — H16223 Keratoconjunctivitis sicca, not specified as Sjogren's, bilateral: Secondary | ICD-10-CM | POA: Diagnosis not present

## 2021-01-18 DIAGNOSIS — H10413 Chronic giant papillary conjunctivitis, bilateral: Secondary | ICD-10-CM | POA: Diagnosis not present

## 2021-01-21 DIAGNOSIS — M2021 Hallux rigidus, right foot: Secondary | ICD-10-CM | POA: Diagnosis not present

## 2021-01-21 DIAGNOSIS — M79671 Pain in right foot: Secondary | ICD-10-CM | POA: Diagnosis not present

## 2021-01-21 DIAGNOSIS — M2041 Other hammer toe(s) (acquired), right foot: Secondary | ICD-10-CM | POA: Diagnosis not present

## 2021-01-26 DIAGNOSIS — R2231 Localized swelling, mass and lump, right upper limb: Secondary | ICD-10-CM | POA: Diagnosis not present

## 2021-01-26 DIAGNOSIS — M67431 Ganglion, right wrist: Secondary | ICD-10-CM | POA: Diagnosis not present

## 2021-03-05 ENCOUNTER — Other Ambulatory Visit: Payer: Self-pay | Admitting: Family Medicine

## 2021-03-18 ENCOUNTER — Other Ambulatory Visit: Payer: Self-pay | Admitting: Family Medicine

## 2021-03-18 DIAGNOSIS — T7840XA Allergy, unspecified, initial encounter: Secondary | ICD-10-CM

## 2021-03-22 ENCOUNTER — Other Ambulatory Visit: Payer: Self-pay | Admitting: Family Medicine

## 2021-03-22 DIAGNOSIS — F419 Anxiety disorder, unspecified: Secondary | ICD-10-CM

## 2021-03-22 NOTE — Telephone Encounter (Signed)
I have called pt to get her scheduled for an OV and she needed to check her schedule and give [place] a call back. I have refill for only 90 day zero refills until she is seen or makes the Appointment. She was last seen virtually on 07/15/20 and canceled her last appt.

## 2021-03-31 DIAGNOSIS — M12811 Other specific arthropathies, not elsewhere classified, right shoulder: Secondary | ICD-10-CM | POA: Diagnosis not present

## 2021-04-06 ENCOUNTER — Encounter: Payer: Self-pay | Admitting: Family Medicine

## 2021-04-06 ENCOUNTER — Other Ambulatory Visit: Payer: Self-pay

## 2021-04-06 ENCOUNTER — Encounter: Payer: Self-pay | Admitting: *Deleted

## 2021-04-06 ENCOUNTER — Ambulatory Visit (INDEPENDENT_AMBULATORY_CARE_PROVIDER_SITE_OTHER): Payer: Medicare Other | Admitting: Family Medicine

## 2021-04-06 VITALS — BP 127/63 | HR 86 | Temp 98.6°F | Resp 12 | Ht 60.0 in | Wt 142.4 lb

## 2021-04-06 DIAGNOSIS — I10 Essential (primary) hypertension: Secondary | ICD-10-CM

## 2021-04-06 DIAGNOSIS — Z79899 Other long term (current) drug therapy: Secondary | ICD-10-CM

## 2021-04-06 DIAGNOSIS — E785 Hyperlipidemia, unspecified: Secondary | ICD-10-CM

## 2021-04-06 DIAGNOSIS — F419 Anxiety disorder, unspecified: Secondary | ICD-10-CM | POA: Diagnosis not present

## 2021-04-06 DIAGNOSIS — F411 Generalized anxiety disorder: Secondary | ICD-10-CM

## 2021-04-06 DIAGNOSIS — T7840XA Allergy, unspecified, initial encounter: Secondary | ICD-10-CM | POA: Diagnosis not present

## 2021-04-06 DIAGNOSIS — J302 Other seasonal allergic rhinitis: Secondary | ICD-10-CM

## 2021-04-06 DIAGNOSIS — E1165 Type 2 diabetes mellitus with hyperglycemia: Secondary | ICD-10-CM | POA: Diagnosis not present

## 2021-04-06 DIAGNOSIS — E1169 Type 2 diabetes mellitus with other specified complication: Secondary | ICD-10-CM

## 2021-04-06 MED ORDER — ESCITALOPRAM OXALATE 20 MG PO TABS: 20.0000 mg | ORAL_TABLET | Freq: Every day | ORAL | 1 refills | Status: DC

## 2021-04-06 MED ORDER — FLUTICASONE PROPIONATE 50 MCG/ACT NA SUSP: 2.0000 | Freq: Every day | NASAL | 6 refills | Status: DC

## 2021-04-06 MED ORDER — LORATADINE 10 MG PO TABS: 10.0000 mg | ORAL_TABLET | Freq: Every day | ORAL | 3 refills | Status: DC

## 2021-04-06 NOTE — Assessment & Plan Note (Signed)
Encouraged heart healthy diet, increase exercise, avoid trans fats, consider a krill oil cap daily 

## 2021-04-06 NOTE — Progress Notes (Signed)
Subjective:   By signing my name below, I, [name], attest that this documentation has been prepared under the direction and in the presence of Dr. Seabron Spates, DO. 04/06/2021      Patient ID: Erin Acosta, female    DOB: July 23, 1951, 70 y.o.   MRN: [id number]  Chief Complaint  Patient presents with  . Anxiety    Medication does not seem to be working.  Having trouble sleeping and feeling restless    . Allergies    Would to change medication     HPI Patient is in today for a office visit. She complains of struggling to sleep because of anxiety from travelling [place] next month. She is also having increased anxiety from making arrangements for her pets and home while she is away. She also complains of allergy symptoms. She cannot take xyzal to manage her symptoms because it upsets her stomach. She also does not use Flonase to manage her allergy symptoms at this time. She continues taking 20 mg Lipitor to manage her cholesterol. She denies having any fever, ear pain, congestion, sinus pain, sore throat, eye pain, chest pain, palpations, cough, shortness of breath, wheezing, nausea, vomiting, diarrhea, constipation, blood in stool, dysuria, frequency, hematuria, dizziness, or headaches at this time. On August 26, 2021,  she will have a shoulder replacement procedure because of her  torn right rotator cuff. She also has an upcoming liposuction procedure on her abdomen.   Past Medical History:  Diagnosis Date  . Allergy   . Anxiety   . Depression   . Genital herpes   . H/O cesarean section   . Hypertension   . Rotator cuff tear   . Scoliosis     Past Surgical History:  Procedure Laterality Date  . BILATERAL CARPAL TUNNEL RELEASE    . BLEPHAROPLASTY    . BREAST BIOPSY     unsure which breast  . CESAREAN SECTION    . FOOT SURGERY Right   . mini face lift    . TRIGGER FINGER RELEASE    . tummy tuck      Family History  Problem Relation Age of Onset  .  Arthritis Other   . Hyperlipidemia Other   . Heart disease Other   . Stroke Other   . Hypertension Other     Social History   Socioeconomic History  . Marital status: Widowed    Spouse name: Not on file  . Number of children: Not on file  . Years of education: Not on file  . Highest education level: Not on file  Occupational History  . Not on file  Tobacco Use  . Smoking status: Never Smoker  . Smokeless tobacco: Never Used  Substance and Sexual Activity  . Alcohol use: Yes    Alcohol/week: 1.0 standard drink    Types: 1 Standard drinks or equivalent per week    Comment: Wine in the evening  . Drug use: No  . Sexual activity: Not on file  Other Topics Concern  . Not on file  Social History Narrative  . Not on file   Social Determinants of Health   Financial Resource Strain: Low Risk   . Difficulty of Paying Living Expenses: Not hard at all  Food Insecurity: No Food Insecurity  . Worried About Programme researcher, broadcasting/film/video in the Last Year: Never true  . Ran Out of Food in the Last Year: Never true  Transportation Needs: No Transportation Needs  . Lack of  Transportation (Medical): No  . Lack of Transportation (Non-Medical): No  Physical Activity: Not on file  Stress: Not on file  Social Connections: Not on file  Intimate Partner Violence: Not on file    Outpatient Medications Prior to Visit  Medication Sig Dispense Refill  . aspirin EC 81 MG tablet Take 81 mg by mouth daily.    Marland Kitchen. atorvastatin (LIPITOR) 20 MG tablet Take 1 tablet by mouth daily.    . clonazePAM (KLONOPIN) 1 MG tablet Take 1 tablet (1 mg total) by mouth daily as needed. 90 tablet 0  . Probiotic Product (PROBIOTIC DAILY PO) Take by mouth.    . spironolactone (ALDACTONE) 25 MG tablet Take 25 mg by mouth daily.    . valACYclovir (VALTREX) 500 MG tablet Take 1 tablet (500 mg total) by mouth daily. 90 tablet 3  . cetirizine (ZYRTEC) 10 MG tablet TAKE 1 TABLET DAILY 90 tablet 3  . escitalopram (LEXAPRO) 10 MG  tablet TAKE 1 TABLET DAILY 90 tablet 0  . amoxicillin-clavulanate (AUGMENTIN) 875-125 MG tablet Take 1 tablet by mouth 2 (two) times daily. 20 tablet 0  . fenofibrate 160 MG tablet Take 1 tablet (160 mg total) by mouth daily. 30 tablet 2  . fluconazole (DIFLUCAN) 150 MG tablet 1 po x1, may repeat in 3 days prn 2 tablet 0  . mirabegron ER (MYRBETRIQ) 50 MG TB24 tablet Take 1 tablet (50 mg total) by mouth daily. 90 tablet 3  . olopatadine (PATANOL) 0.1 % ophthalmic solution      No facility-administered medications prior to visit.    No Known Allergies  Review of Systems  Constitutional: Negative for fever.  HENT: Negative for congestion, ear pain, sinus pain and sore throat.   Eyes: Negative for pain.  Respiratory: Negative for cough, shortness of breath and wheezing.   Cardiovascular: Negative for chest pain and palpitations.  Gastrointestinal: Negative for blood in stool, constipation, diarrhea, nausea and vomiting.  Genitourinary: Negative for dysuria, frequency and hematuria.  Neurological: Negative for dizziness and headaches.  Endo/Heme/Allergies: Positive for environmental allergies.  Psychiatric/Behavioral: The patient is nervous/anxious and has insomnia.        Objective:    Physical Exam Constitutional:      Appearance: Normal appearance.  HENT:     Head: Normocephalic and atraumatic.     Right Ear: External ear normal.     Left Ear: External ear normal.  Eyes:     Extraocular Movements: Extraocular movements intact.     Pupils: Pupils are equal, round, and reactive to light.  Cardiovascular:     Rate and Rhythm: Normal rate and regular rhythm.     Pulses: Normal pulses.     Heart sounds: Normal heart sounds. No murmur heard. No friction rub. No gallop.   Pulmonary:     Effort: Pulmonary effort is normal. No respiratory distress.     Breath sounds: Normal breath sounds. No stridor. No wheezing, rhonchi or rales.  Skin:    General: Skin is warm and dry.   Neurological:     Mental Status: She is alert and oriented to person, place, and time.  Psychiatric:        Mood and Affect: Mood is anxious.        Behavior: Behavior normal.        Thought Content: Thought content does not include homicidal or suicidal ideation. Thought content does not include homicidal or suicidal plan.        Cognition and Memory: Cognition normal.  Judgment: Judgment normal.     BP 127/63 (BP Location: Left Arm, Cuff Size: Normal)   Pulse 86   Temp 98.6 F (37 C) (Oral)   Resp 12   Ht 5' (1.524 m)   Wt 142 lb 6.4 oz (64.6 kg)   SpO2 99%   BMI 27.81 kg/m  Wt Readings from Last 3 Encounters:  04/06/21 142 lb 6.4 oz (64.6 kg)  07/15/20 141 lb (64 kg)  04/21/20 148 lb 3.2 oz (67.2 kg)    Diabetic Foot Exam - Simple   No data filed    Lab Results  Component Value Date   WBC 7.5 08/24/2017   HGB 15.1 (H) 08/24/2017   HCT 45.1 08/24/2017   PLT 268.0 08/24/2017   GLUCOSE 101 (H) 04/21/2020   CHOL 139 04/21/2020   TRIG 235.0 (H) 04/21/2020   HDL 37.70 (L) 04/21/2020   LDLDIRECT 73.0 04/21/2020   LDLCALC 92 10/29/2019   ALT 16 04/21/2020   AST 15 04/21/2020   NA 138 04/21/2020   K 5.0 04/21/2020   CL 103 04/21/2020   CREATININE 0.60 04/21/2020   BUN 11 04/21/2020   CO2 28 04/21/2020   TSH 1.43 08/24/2017    Lab Results  Component Value Date   TSH 1.43 08/24/2017   Lab Results  Component Value Date   WBC 7.5 08/24/2017   HGB 15.1 (H) 08/24/2017   HCT 45.1 08/24/2017   MCV 91.3 08/24/2017   PLT 268.0 08/24/2017   Lab Results  Component Value Date   NA 138 04/21/2020   K 5.0 04/21/2020   CO2 28 04/21/2020   GLUCOSE 101 (H) 04/21/2020   BUN 11 04/21/2020   CREATININE 0.60 04/21/2020   BILITOT 0.6 04/21/2020   ALKPHOS 85 04/21/2020   AST 15 04/21/2020   ALT 16 04/21/2020   PROT 6.3 04/21/2020   ALBUMIN 4.2 04/21/2020   CALCIUM 9.5 04/21/2020   GFR 99.07 04/21/2020   Lab Results  Component Value Date   CHOL 139  04/21/2020   Lab Results  Component Value Date   HDL 37.70 (L) 04/21/2020   Lab Results  Component Value Date   LDLCALC 92 10/29/2019   Lab Results  Component Value Date   TRIG 235.0 (H) 04/21/2020   Lab Results  Component Value Date   CHOLHDL 4 04/21/2020   No results found for: HGBA1C     Assessment & Plan:   Problem List Items Addressed This Visit      Unprioritized   Allergies    D/c zyrtec Start claritin and flonase  F/u 3 months or sooner prn       Essential hypertension    Well controlled, no changes to meds. Encouraged heart healthy diet such as the DASH diet and exercise as tolerated.       Generalized anxiety disorder    Inc lexapro to 20 mg daily con't klonopin  uds updated       Relevant Medications   escitalopram (LEXAPRO) 20 MG tablet   Hyperlipidemia    Encouraged heart healthy diet, increase exercise, avoid trans fats, consider a krill oil cap daily       Other Visit Diagnoses    Anxiety    -  Primary   Relevant Medications   escitalopram (LEXAPRO) 20 MG tablet   Seasonal allergies       Relevant Medications   loratadine (CLARITIN) 10 MG tablet   fluticasone (FLONASE) 50 MCG/ACT nasal spray   Uncontrolled type 2 diabetes  mellitus with hyperglycemia (HCC)       Hyperlipidemia associated with type 2 diabetes mellitus (HCC)       Long-term use of high-risk medication       Relevant Orders   DRUG MONITORING, PANEL 8 WITH CONFIRMATION, URINE       Meds ordered this encounter  Medications  . escitalopram (LEXAPRO) 20 MG tablet    Sig: Take 1 tablet (20 mg total) by mouth daily.    Dispense:  90 tablet    Refill:  1  . loratadine (CLARITIN) 10 MG tablet    Sig: Take 1 tablet (10 mg total) by mouth daily.    Dispense:  30 tablet    Refill:  3  . fluticasone (FLONASE) 50 MCG/ACT nasal spray    Sig: Place 2 sprays into both nostrils daily.    Dispense:  16 g    Refill:  6    I, Dr. Seabron Spates, DO., personally preformed  the services described in this documentation.  All medical record entries made by the scribe were at my direction and in my presence.  I have reviewed the chart and discharge instructions (if applicable) and agree that the record reflects my personal performance and is accurate and complete. 04/06/2021   I,[name],acting as a scribe for [name], DO.,have documented all relevant documentation on the behalf of [name], DO,as directed by  [name], DO while in the presence of [name], DO.   [name], DO

## 2021-04-06 NOTE — Assessment & Plan Note (Signed)
Inc lexapro to 20 mg daily con't klonopin  uds updated

## 2021-04-06 NOTE — Patient Instructions (Signed)
[email or web address]">  Generalized Anxiety Disorder, Adult Generalized anxiety disorder (GAD) is a mental health condition. Unlike normal worries, anxiety related to GAD is not triggered by a specific event. These worries do not fade or get better with time. GAD interferes with relationships, work, and school. GAD symptoms can vary from mild to severe. People with severe GAD can have intense waves of anxiety with physical symptoms that are similar to panic attacks. What are the causes? The exact cause of GAD is not known, but the following are believed to have an impact:  Differences in natural brain chemicals.  Genes passed down from parents to children.  Differences in the way threats are perceived.  Development during childhood.  Personality. What increases the risk? The following factors may make you more likely to develop this condition:  Being female.  Having a family history of anxiety disorders.  Being very shy.  Experiencing very stressful life events, such as the death of a loved one.  Having a very stressful family environment. What are the signs or symptoms? People with GAD often worry excessively about many things in their lives, such as their health and family. Symptoms may also include:  Mental and emotional symptoms: ? Worrying excessively about natural disasters. ? Fear of being late. ? Difficulty concentrating. ? Fears that others are judging your performance.  Physical symptoms: ? Fatigue. ? Headaches, muscle tension, muscle twitches, trembling, or feeling shaky. ? Feeling like your heart is pounding or beating very fast. ? Feeling out of breath or like you cannot take a deep breath. ? Having trouble falling asleep or staying asleep, or experiencing restlessness. ? Sweating. ? Nausea, diarrhea, or irritable bowel syndrome (IBS).  Behavioral symptoms: ? Experiencing erratic moods or irritability. ? Avoidance of new situations. ? Avoidance of  people. ? Extreme difficulty making decisions. How is this diagnosed? This condition is diagnosed based on your symptoms and medical history. You will also have a physical exam. Your health care provider may perform tests to rule out other possible causes of your symptoms. To be diagnosed with GAD, a person must have anxiety that:  Is out of his or her control.  Affects several different aspects of his or her life, such as work and relationships.  Causes distress that makes him or her unable to take part in normal activities.  Includes at least three symptoms of GAD, such as restlessness, fatigue, trouble concentrating, irritability, muscle tension, or sleep problems. Before your health care provider can confirm a diagnosis of GAD, these symptoms must be present more days than they are not, and they must last for 6 months or longer. How is this treated? This condition may be treated with:  Medicine. Antidepressant medicine is usually prescribed for long-term daily control. Anti-anxiety medicines may be added in severe cases, especially when panic attacks occur.  Talk therapy (psychotherapy). Certain types of talk therapy can be helpful in treating GAD by providing support, education, and guidance. Options include: ? Cognitive behavioral therapy (CBT). People learn coping skills and self-calming techniques to ease their physical symptoms. They learn to identify unrealistic thoughts and behaviors and to replace them with more appropriate thoughts and behaviors. ? Acceptance and commitment therapy (ACT). This treatment teaches people how to be mindful as a way to cope with unwanted thoughts and feelings. ? Biofeedback. This process trains you to manage your body's response (physiological response) through breathing techniques and relaxation methods. You will work with a therapist while machines are used to monitor your physical   symptoms.  Stress management techniques. These include yoga,  meditation, and exercise. A mental health specialist can help determine which treatment is best for you. Some people see improvement with one type of therapy. However, other people require a combination of therapies.   Follow these instructions at home: Lifestyle  Maintain a consistent routine and schedule.  Anticipate stressful situations. Create a plan, and allow extra time to work with your plan.  Practice stress management or self-calming techniques that you have learned from your therapist or your health care provider. General instructions  Take over-the-counter and prescription medicines only as told by your health care provider.  Understand that you are likely to have setbacks. Accept this and be kind to yourself as you persist to take better care of yourself.  Recognize and accept your accomplishments, even if you judge them as small.  Keep all follow-up visits as told by your health care provider. This is important. Contact a health care provider if:  Your symptoms do not get better.  Your symptoms get worse.  You have signs of depression, such as: ? A persistently sad or irritable mood. ? Loss of enjoyment in activities that used to bring you joy. ? Change in weight or eating. ? Changes in sleeping habits. ? Avoiding friends or family members. ? Loss of energy for normal tasks. ? Feelings of guilt or worthlessness. Get help right away if:  You have serious thoughts about hurting yourself or others. If you ever feel like you may hurt yourself or others, or have thoughts about taking your own life, get help right away. Go to your nearest emergency department or:  Call your local emergency services (911 in the [place]  Call a suicide crisis helpline, such as the National Suicide Prevention Lifeline at 1-800-273-8255. This is open 24 hours a day in the [place]  Text the Crisis Text Line at 741741 (in the [place] Summary  Generalized anxiety disorder (GAD) is a mental  health condition that involves worry that is not triggered by a specific event.  People with GAD often worry excessively about many things in their lives, such as their health and family.  GAD may cause symptoms such as restlessness, trouble concentrating, sleep problems, frequent sweating, nausea, diarrhea, headaches, and trembling or muscle twitching.  A mental health specialist can help determine which treatment is best for you. Some people see improvement with one type of therapy. However, other people require a combination of therapies. This information is not intended to replace advice given to you by your health care provider. Make sure you discuss any questions you have with your health care provider. Document Revised: 08/28/2019 Document Reviewed: 08/28/2019 Elsevier Patient Education  2021 Elsevier Inc.  

## 2021-04-06 NOTE — Assessment & Plan Note (Signed)
D/c zyrtec Start claritin and flonase  F/u 3 months or sooner prn

## 2021-04-06 NOTE — Assessment & Plan Note (Signed)
Well controlled, no changes to meds. Encouraged heart healthy diet such as the DASH diet and exercise as tolerated.  °

## 2021-04-07 LAB — HEMOGLOBIN A1C: Hgb A1c MFr Bld: 5.9 % (ref 4.6–6.5)

## 2021-04-07 LAB — COMPREHENSIVE METABOLIC PANEL
ALT: 18 U/L (ref 0–35)
AST: 17 U/L (ref 0–37)
Albumin: 4.4 g/dL (ref 3.5–5.2)
Alkaline Phosphatase: 74 U/L (ref 39–117)
BUN: 19 mg/dL (ref 6–23)
CO2: 28 mEq/L (ref 19–32)
Calcium: 9.4 mg/dL (ref 8.4–10.5)
Chloride: 101 mEq/L (ref 96–112)
Creatinine, Ser: 0.65 mg/dL (ref 0.40–1.20)
GFR: 89.38 mL/min (ref >60.00)
Glucose, Bld: 98 mg/dL (ref 70–99)
Potassium: 4.6 mEq/L (ref 3.5–5.1)
Sodium: 135 mEq/L (ref 135–145)
Total Bilirubin: 0.7 mg/dL (ref 0.2–1.2)
Total Protein: 7.1 g/dL (ref 6.0–8.3)

## 2021-04-07 LAB — LIPID PANEL
Cholesterol: 157 mg/dL (ref 0–200)
HDL: 51.8 mg/dL (ref >39.00)
LDL Cholesterol: 74 mg/dL (ref 0–99)
NonHDL: 105.23
Total CHOL/HDL Ratio: 3
Triglycerides: 155 mg/dL — ABNORMAL HIGH (ref 0.0–149.0)
VLDL: 31 mg/dL (ref 0.0–40.0)

## 2021-04-07 LAB — DRUG MONITORING, PANEL 8 WITH CONFIRMATION, URINE
6 Acetylmorphine: NEGATIVE ng/mL (ref <10)
Alcohol Metabolites: NEGATIVE ng/mL
Amphetamines: NEGATIVE ng/mL (ref <500)
Benzodiazepines: NEGATIVE ng/mL (ref <100)
Buprenorphine, Urine: NEGATIVE ng/mL (ref <5)
Cocaine Metabolite: NEGATIVE ng/mL (ref <150)
Creatinine: 60.5 mg/dL
MDMA: NEGATIVE ng/mL (ref <500)
Marijuana Metabolite: NEGATIVE ng/mL (ref <20)
Opiates: NEGATIVE ng/mL (ref <100)
Oxidant: NEGATIVE ug/mL
Oxycodone: NEGATIVE ng/mL (ref <100)
pH: 5.8 (ref 4.5–9.0)

## 2021-04-07 LAB — DM TEMPLATE

## 2021-04-28 DIAGNOSIS — Z20822 Contact with and (suspected) exposure to covid-19: Secondary | ICD-10-CM | POA: Diagnosis not present

## 2021-05-07 ENCOUNTER — Telehealth: Payer: Self-pay | Admitting: Family Medicine

## 2021-05-07 NOTE — Telephone Encounter (Signed)
Copied from CRM [id number]. Topic: Medicare AWV >> May 07, 2021  9:37 AM Harris-Coley, Avon Gully wrote: Reason for VUD:THYH message for patient to schedule Annual Wellness Visit.  Please schedule with Health Nurse Advisor Clare Gandy. at [facility].

## 2021-05-17 ENCOUNTER — Ambulatory Visit: Payer: Medicare Other | Admitting: Family Medicine

## 2021-05-18 ENCOUNTER — Telehealth (INDEPENDENT_AMBULATORY_CARE_PROVIDER_SITE_OTHER): Payer: Medicare Other | Admitting: Family Medicine

## 2021-05-18 DIAGNOSIS — J40 Bronchitis, not specified as acute or chronic: Secondary | ICD-10-CM

## 2021-05-18 MED ORDER — AZITHROMYCIN 250 MG PO TABS: ORAL_TABLET | ORAL | 0 refills | Status: DC

## 2021-05-18 NOTE — Assessment & Plan Note (Signed)
z pak con't zyrtec and flonase Consider pred taper  Call prn

## 2021-05-18 NOTE — Progress Notes (Signed)
MyChart Video Visit    Virtual Visit via Video Note   This visit type was conducted due to national recommendations for restrictions regarding the COVID-19 Pandemic (e.g. social distancing) in an effort to limit this patient's exposure and mitigate transmission in our community. This patient is at least at moderate risk for complications without adequate follow up. This format is felt to be most appropriate for this patient at this time. Physical exam was limited by quality of the video and audio technology used for the visit. [name] was able to get the patient set up on a video visit.  Patient location: home alone Patient and provider in visit Provider location: Office  I discussed the limitations of evaluation and management by telemedicine and the availability of in person appointments. The patient expressed understanding and agreed to proceed.  Visit Date: 05/18/2021  Today's healthcare provider: [name], DO     Subjective:    Patient ID: Erin Acosta, female    DOB: 1951/11/09, 70 y.o.   MRN: [id number]  Chief Complaint  Patient presents with   Covid Positive    HPI Patient is in today for sinus congestion and she tested positive for covid today.  Her symptoms started after she got off a cruise on 6/21 + cough with yellow green mucus    now she has cough and its productive   she was on a [employer] cruise and had 6 days extension.    Past Medical History:  Diagnosis Date   Allergy    Anxiety    Depression    Genital herpes    H/O cesarean section    Hypertension    Rotator cuff tear    Scoliosis     Past Surgical History:  Procedure Laterality Date   BILATERAL CARPAL TUNNEL RELEASE     BLEPHAROPLASTY     BREAST BIOPSY     unsure which breast   CESAREAN SECTION     FOOT SURGERY Right    mini face lift     TRIGGER FINGER RELEASE     tummy tuck      Family History  Problem Relation Age of Onset   Arthritis Other    Hyperlipidemia  Other    Heart disease Other    Stroke Other    Hypertension Other     Social History   Socioeconomic History   Marital status: Widowed    Spouse name: Not on file   Number of children: Not on file   Years of education: Not on file   Highest education level: Not on file  Occupational History   Not on file  Tobacco Use   Smoking status: Never   Smokeless tobacco: Never  Substance and Sexual Activity   Alcohol use: Yes    Alcohol/week: 1.0 standard drink    Types: 1 Standard drinks or equivalent per week    Comment: Wine in the evening   Drug use: No   Sexual activity: Not on file  Other Topics Concern   Not on file  Social History Narrative   Not on file   Social Determinants of Health   Financial Resource Strain: Not on file  Food Insecurity: Not on file  Transportation Needs: Not on file  Physical Activity: Not on file  Stress: Not on file  Social Connections: Not on file  Intimate Partner Violence: Not on file    Outpatient Medications Prior to Visit  Medication Sig Dispense Refill  aspirin EC 81 MG tablet Take 81 mg by mouth daily.     atorvastatin (LIPITOR) 20 MG tablet Take 1 tablet by mouth daily.     clonazePAM (KLONOPIN) 1 MG tablet Take 1 tablet (1 mg total) by mouth daily as needed. 90 tablet 0   escitalopram (LEXAPRO) 20 MG tablet Take 1 tablet (20 mg total) by mouth daily. 90 tablet 1   fluticasone (FLONASE) 50 MCG/ACT nasal spray Place 2 sprays into both nostrils daily. 16 g 6   loratadine (CLARITIN) 10 MG tablet Take 1 tablet (10 mg total) by mouth daily. 30 tablet 3   Probiotic Product (PROBIOTIC DAILY PO) Take by mouth.     spironolactone (ALDACTONE) 25 MG tablet Take 25 mg by mouth daily.     valACYclovir (VALTREX) 500 MG tablet Take 1 tablet (500 mg total) by mouth daily. 90 tablet 3   No facility-administered medications prior to visit.    No Known Allergies  Review of Systems  Constitutional:  Negative for fever and malaise/fatigue.   HENT:  Negative for congestion.   Eyes:  Negative for blurred vision.  Respiratory:  Positive for cough and sputum production. Negative for shortness of breath and wheezing.   Cardiovascular:  Negative for chest pain, palpitations and leg swelling.  Gastrointestinal:  Negative for abdominal pain, blood in stool and nausea.  Genitourinary:  Negative for dysuria and frequency.  Musculoskeletal:  Negative for falls.  Skin:  Negative for rash.  Neurological:  Negative for dizziness, loss of consciousness and headaches.  Endo/Heme/Allergies:  Negative for environmental allergies.  Psychiatric/Behavioral:  Negative for depression. The patient is not nervous/anxious.       Objective:    Physical Exam Vitals and nursing note reviewed.  Pulmonary:     Effort: Pulmonary effort is normal.  Psychiatric:        Mood and Affect: Mood normal.        Behavior: Behavior normal.        Thought Content: Thought content normal.        Judgment: Judgment normal.   There were no vitals taken for this visit. Wt Readings from Last 3 Encounters:  04/06/21 142 lb 6.4 oz (64.6 kg)  07/15/20 141 lb (64 kg)  04/21/20 148 lb 3.2 oz (67.2 kg)    Diabetic Foot Exam - Simple   No data filed    Lab Results  Component Value Date   WBC 7.5 08/24/2017   HGB 15.1 (H) 08/24/2017   HCT 45.1 08/24/2017   PLT 268.0 08/24/2017   GLUCOSE 98 04/06/2021   CHOL 157 04/06/2021   TRIG 155.0 (H) 04/06/2021   HDL 51.80 04/06/2021   LDLDIRECT 73.0 04/21/2020   LDLCALC 74 04/06/2021   ALT 18 04/06/2021   AST 17 04/06/2021   NA 135 04/06/2021   K 4.6 04/06/2021   CL 101 04/06/2021   CREATININE 0.65 04/06/2021   BUN 19 04/06/2021   CO2 28 04/06/2021   TSH 1.43 08/24/2017   HGBA1C 5.9 04/06/2021    Lab Results  Component Value Date   TSH 1.43 08/24/2017   Lab Results  Component Value Date   WBC 7.5 08/24/2017   HGB 15.1 (H) 08/24/2017   HCT 45.1 08/24/2017   MCV 91.3 08/24/2017   PLT 268.0 08/24/2017    Lab Results  Component Value Date   NA 135 04/06/2021   K 4.6 04/06/2021   CO2 28 04/06/2021   GLUCOSE 98 04/06/2021   BUN 19 04/06/2021  CREATININE 0.65 04/06/2021   BILITOT 0.7 04/06/2021   ALKPHOS 74 04/06/2021   AST 17 04/06/2021   ALT 18 04/06/2021   PROT 7.1 04/06/2021   ALBUMIN 4.4 04/06/2021   CALCIUM 9.4 04/06/2021   GFR 89.38 04/06/2021   Lab Results  Component Value Date   CHOL 157 04/06/2021   Lab Results  Component Value Date   HDL 51.80 04/06/2021   Lab Results  Component Value Date   LDLCALC 74 04/06/2021   Lab Results  Component Value Date   TRIG 155.0 (H) 04/06/2021   Lab Results  Component Value Date   CHOLHDL 3 04/06/2021   Lab Results  Component Value Date   HGBA1C 5.9 04/06/2021       Assessment & Plan:   Problem List Items Addressed This Visit       Unprioritized   Bronchitis - Primary    z pak con't zyrtec and flonase Consider pred taper  Call prn       Relevant Medications   azithromycin (ZITHROMAX Z-PAK) 250 MG tablet    I am having Erin Acosta start on azithromycin. I am also having her maintain her aspirin EC, Probiotic Product (PROBIOTIC DAILY PO), atorvastatin, spironolactone, clonazePAM, valACYclovir, escitalopram, loratadine, and fluticasone.  Meds ordered this encounter  Medications   azithromycin (ZITHROMAX Z-PAK) 250 MG tablet    Sig: As directed    Dispense:  6 each    Refill:  0    I discussed the assessment and treatment plan with the patient. The patient was provided an opportunity to ask questions and all were answered. The patient agreed with the plan and demonstrated an understanding of the instructions.   The patient was advised to call back or seek an in-person evaluation if the symptoms worsen or if the condition fails to improve as anticipated.     [name], DO [facility] at [facility] [phone] (phone) [phone] (fax)  [facility]

## 2021-05-19 ENCOUNTER — Encounter: Payer: Self-pay | Admitting: Family Medicine

## 2021-06-10 DIAGNOSIS — H16223 Keratoconjunctivitis sicca, not specified as Sjogren's, bilateral: Secondary | ICD-10-CM | POA: Diagnosis not present

## 2021-06-10 DIAGNOSIS — H04123 Dry eye syndrome of bilateral lacrimal glands: Secondary | ICD-10-CM | POA: Diagnosis not present

## 2021-06-10 DIAGNOSIS — H10413 Chronic giant papillary conjunctivitis, bilateral: Secondary | ICD-10-CM | POA: Diagnosis not present

## 2021-06-10 DIAGNOSIS — H2513 Age-related nuclear cataract, bilateral: Secondary | ICD-10-CM | POA: Diagnosis not present

## 2021-06-15 DIAGNOSIS — L700 Acne vulgaris: Secondary | ICD-10-CM | POA: Diagnosis not present

## 2021-06-16 ENCOUNTER — Other Ambulatory Visit (HOSPITAL_BASED_OUTPATIENT_CLINIC_OR_DEPARTMENT_OTHER): Payer: Self-pay | Admitting: Family Medicine

## 2021-06-16 DIAGNOSIS — Z1231 Encounter for screening mammogram for malignant neoplasm of breast: Secondary | ICD-10-CM

## 2021-06-30 DIAGNOSIS — I771 Stricture of artery: Secondary | ICD-10-CM | POA: Diagnosis not present

## 2021-06-30 DIAGNOSIS — I10 Essential (primary) hypertension: Secondary | ICD-10-CM | POA: Diagnosis not present

## 2021-06-30 DIAGNOSIS — E789 Disorder of lipoprotein metabolism, unspecified: Secondary | ICD-10-CM | POA: Diagnosis not present

## 2021-06-30 DIAGNOSIS — I2510 Atherosclerotic heart disease of native coronary artery without angina pectoris: Secondary | ICD-10-CM | POA: Diagnosis not present

## 2021-07-05 ENCOUNTER — Telehealth: Payer: Self-pay

## 2021-07-05 ENCOUNTER — Other Ambulatory Visit: Payer: Self-pay | Admitting: Family Medicine

## 2021-07-05 DIAGNOSIS — F411 Generalized anxiety disorder: Secondary | ICD-10-CM

## 2021-07-05 MED ORDER — CLONAZEPAM 1 MG PO TABS: 1.0000 mg | ORAL_TABLET | Freq: Two times a day (BID) | ORAL | 1 refills | Status: DC | PRN

## 2021-07-05 NOTE — Telephone Encounter (Signed)
Pt seen 05/17 and her lexapro was increased to 20 MG. Klonopin remained the same dose. Would you like a OV or virtual? Please advise

## 2021-07-05 NOTE — Telephone Encounter (Signed)
Pt called stating she would like to increase the prescribing dosage of her klonopin from 1mg  to 2mg  as needed for anxiety.  Please advise.

## 2021-07-05 NOTE — Telephone Encounter (Signed)
Pt called. LVM 

## 2021-07-15 DIAGNOSIS — H5213 Myopia, bilateral: Secondary | ICD-10-CM | POA: Diagnosis not present

## 2021-07-15 DIAGNOSIS — H524 Presbyopia: Secondary | ICD-10-CM | POA: Diagnosis not present

## 2021-07-15 DIAGNOSIS — H52223 Regular astigmatism, bilateral: Secondary | ICD-10-CM | POA: Diagnosis not present

## 2021-07-19 ENCOUNTER — Encounter (HOSPITAL_BASED_OUTPATIENT_CLINIC_OR_DEPARTMENT_OTHER): Payer: Self-pay

## 2021-07-19 ENCOUNTER — Ambulatory Visit (HOSPITAL_BASED_OUTPATIENT_CLINIC_OR_DEPARTMENT_OTHER)
Admission: RE | Admit: 2021-07-19 | Discharge: 2021-07-19 | Disposition: A | Discharge status: Home / Self Care | Payer: Medicare Other | Source: Ambulatory Visit | Attending: Family Medicine | Admitting: Family Medicine

## 2021-07-19 ENCOUNTER — Other Ambulatory Visit: Payer: Self-pay

## 2021-07-19 DIAGNOSIS — Z1231 Encounter for screening mammogram for malignant neoplasm of breast: Secondary | ICD-10-CM | POA: Diagnosis not present

## 2021-08-04 DIAGNOSIS — E0844 Diabetes mellitus due to underlying condition with diabetic amyotrophy: Secondary | ICD-10-CM | POA: Diagnosis not present

## 2021-08-04 DIAGNOSIS — M12811 Other specific arthropathies, not elsewhere classified, right shoulder: Secondary | ICD-10-CM | POA: Diagnosis not present

## 2021-08-04 DIAGNOSIS — R826 Abnormal urine levels of substances chiefly nonmedicinal as to source: Secondary | ICD-10-CM | POA: Diagnosis not present

## 2021-08-23 DIAGNOSIS — Z01810 Encounter for preprocedural cardiovascular examination: Secondary | ICD-10-CM | POA: Diagnosis not present

## 2021-08-23 DIAGNOSIS — M12811 Other specific arthropathies, not elsewhere classified, right shoulder: Secondary | ICD-10-CM | POA: Diagnosis not present

## 2021-08-24 DIAGNOSIS — Z01810 Encounter for preprocedural cardiovascular examination: Secondary | ICD-10-CM | POA: Diagnosis not present

## 2021-08-26 DIAGNOSIS — M12811 Other specific arthropathies, not elsewhere classified, right shoulder: Secondary | ICD-10-CM | POA: Diagnosis not present

## 2021-08-26 DIAGNOSIS — R8290 Unspecified abnormal findings in urine: Secondary | ICD-10-CM | POA: Diagnosis not present

## 2021-08-26 DIAGNOSIS — M19011 Primary osteoarthritis, right shoulder: Secondary | ICD-10-CM | POA: Diagnosis not present

## 2021-08-26 DIAGNOSIS — G8918 Other acute postprocedural pain: Secondary | ICD-10-CM | POA: Diagnosis not present

## 2021-08-26 DIAGNOSIS — E1144 Type 2 diabetes mellitus with diabetic amyotrophy: Secondary | ICD-10-CM | POA: Diagnosis not present

## 2021-08-27 DIAGNOSIS — R8290 Unspecified abnormal findings in urine: Secondary | ICD-10-CM | POA: Diagnosis not present

## 2021-08-27 DIAGNOSIS — M12811 Other specific arthropathies, not elsewhere classified, right shoulder: Secondary | ICD-10-CM | POA: Diagnosis not present

## 2021-08-27 DIAGNOSIS — E1144 Type 2 diabetes mellitus with diabetic amyotrophy: Secondary | ICD-10-CM | POA: Diagnosis not present

## 2021-09-13 DIAGNOSIS — Z9889 Other specified postprocedural states: Secondary | ICD-10-CM | POA: Diagnosis not present

## 2021-09-13 DIAGNOSIS — M12811 Other specific arthropathies, not elsewhere classified, right shoulder: Secondary | ICD-10-CM | POA: Diagnosis not present

## 2021-09-16 ENCOUNTER — Other Ambulatory Visit: Payer: Self-pay | Admitting: Family Medicine

## 2021-09-16 DIAGNOSIS — F419 Anxiety disorder, unspecified: Secondary | ICD-10-CM

## 2021-09-21 DIAGNOSIS — Z9889 Other specified postprocedural states: Secondary | ICD-10-CM | POA: Diagnosis not present

## 2021-09-21 DIAGNOSIS — M12811 Other specific arthropathies, not elsewhere classified, right shoulder: Secondary | ICD-10-CM | POA: Diagnosis not present

## 2021-09-21 DIAGNOSIS — M25611 Stiffness of right shoulder, not elsewhere classified: Secondary | ICD-10-CM | POA: Diagnosis not present

## 2021-09-24 DIAGNOSIS — Z9889 Other specified postprocedural states: Secondary | ICD-10-CM | POA: Diagnosis not present

## 2021-09-24 DIAGNOSIS — M25611 Stiffness of right shoulder, not elsewhere classified: Secondary | ICD-10-CM | POA: Diagnosis not present

## 2021-09-24 DIAGNOSIS — M12811 Other specific arthropathies, not elsewhere classified, right shoulder: Secondary | ICD-10-CM | POA: Diagnosis not present

## 2021-09-30 DIAGNOSIS — L700 Acne vulgaris: Secondary | ICD-10-CM | POA: Diagnosis not present

## 2021-10-04 DIAGNOSIS — M25611 Stiffness of right shoulder, not elsewhere classified: Secondary | ICD-10-CM | POA: Diagnosis not present

## 2021-10-04 DIAGNOSIS — Z9889 Other specified postprocedural states: Secondary | ICD-10-CM | POA: Diagnosis not present

## 2021-10-04 DIAGNOSIS — M12811 Other specific arthropathies, not elsewhere classified, right shoulder: Secondary | ICD-10-CM | POA: Diagnosis not present

## 2021-10-07 DIAGNOSIS — M25611 Stiffness of right shoulder, not elsewhere classified: Secondary | ICD-10-CM | POA: Diagnosis not present

## 2021-10-07 DIAGNOSIS — Z9889 Other specified postprocedural states: Secondary | ICD-10-CM | POA: Diagnosis not present

## 2021-10-07 DIAGNOSIS — M12811 Other specific arthropathies, not elsewhere classified, right shoulder: Secondary | ICD-10-CM | POA: Diagnosis not present

## 2021-10-08 ENCOUNTER — Ambulatory Visit (INDEPENDENT_AMBULATORY_CARE_PROVIDER_SITE_OTHER): Payer: Medicare Other

## 2021-10-08 VITALS — Ht 60.0 in | Wt 142.0 lb

## 2021-10-08 DIAGNOSIS — Z0000 Encounter for general adult medical examination without abnormal findings: Secondary | ICD-10-CM | POA: Diagnosis not present

## 2021-10-08 DIAGNOSIS — Z780 Asymptomatic menopausal state: Secondary | ICD-10-CM | POA: Diagnosis not present

## 2021-10-08 NOTE — Patient Instructions (Signed)
[name] , Thank you for taking time to complete your Medicare Wellness Visit. I appreciate your ongoing commitment to your health goals. Please review the following plan we discussed and let me know if I can assist you in the future.   Screening recommendations/referrals: Colonoscopy: Specific date unknown.Per our conversation, possibly done at age 70. Mammogram: Completed 07/19/2021-Due 07/19/2022 Bone Density: Ordered today. Someone will call you to schedule Recommended yearly ophthalmology/optometry visit for glaucoma screening and checkup Recommended yearly dental visit for hygiene and checkup  Vaccinations: Influenza vaccine: Up to date Pneumococcal vaccine: Due-May obtain vaccine at our office or your local pharmacy. Tdap vaccine: Up to date Shingles vaccine: Completed vaccines   Covid-19:Up to date  Advanced directives: Please bring a copy of Living Will and/or Healthcare Power of Attorney for your chart.   Conditions/risks identified: See problem list  Next appointment: Follow up in one year for your annual wellness visit    Preventive Care 65 Years and Older, Female Preventive care refers to lifestyle choices and visits with your health care provider that can promote health and wellness. What does preventive care include? A yearly physical exam. This is also called an annual well check. Dental exams once or twice a year. Routine eye exams. Ask your health care provider how often you should have your eyes checked. Personal lifestyle choices, including: Daily care of your teeth and gums. Regular physical activity. Eating a healthy diet. Avoiding tobacco and drug use. Limiting alcohol use. Practicing safe sex. Taking low-dose aspirin every day. Taking vitamin and mineral supplements as recommended by your health care provider. What happens during an annual well check? The services and screenings done by your health care provider during your annual well check will depend  on your age, overall health, lifestyle risk factors, and family history of disease. Counseling  Your health care provider may ask you questions about your: Alcohol use. Tobacco use. Drug use. Emotional well-being. Home and relationship well-being. Sexual activity. Eating habits. History of falls. Memory and ability to understand (cognition). Work and work Astronomer. Reproductive health. Screening  You may have the following tests or measurements: Height, weight, and BMI. Blood pressure. Lipid and cholesterol levels. These may be checked every 5 years, or more frequently if you are over 28 years old. Skin check. Lung cancer screening. You may have this screening every year starting at age 30 if you have a 30-pack-year history of smoking and currently smoke or have quit within the past 15 years. Fecal occult blood test (FOBT) of the stool. You may have this test every year starting at age 43. Flexible sigmoidoscopy or colonoscopy. You may have a sigmoidoscopy every 5 years or a colonoscopy every 10 years starting at age 24. Hepatitis C blood test. Hepatitis B blood test. Sexually transmitted disease (STD) testing. Diabetes screening. This is done by checking your blood sugar (glucose) after you have not eaten for a while (fasting). You may have this done every 1-3 years. Bone density scan. This is done to screen for osteoporosis. You may have this done starting at age 70. Mammogram. This may be done every 1-2 years. Talk to your health care provider about how often you should have regular mammograms. Talk with your health care provider about your test results, treatment options, and if necessary, the need for more tests. Vaccines  Your health care provider may recommend certain vaccines, such as: Influenza vaccine. This is recommended every year. Tetanus, diphtheria, and acellular pertussis (Tdap, Td) vaccine. You may need  a Td booster every 10 years. Zoster vaccine. You may need  this after age 34. Pneumococcal 13-valent conjugate (PCV13) vaccine. One dose is recommended after age 30. Pneumococcal polysaccharide (PPSV23) vaccine. One dose is recommended after age 57. Talk to your health care provider about which screenings and vaccines you need and how often you need them. This information is not intended to replace advice given to you by your health care provider. Make sure you discuss any questions you have with your health care provider. Document Released: 12/04/2015 Document Revised: 07/27/2016 Document Reviewed: 09/08/2015 Elsevier Interactive Patient Education  2017 [employer] Prevention in the Home Falls can cause injuries. They can happen to people of all ages. There are many things you can do to make your home safe and to help prevent falls. What can I do on the outside of my home? Regularly fix the edges of walkways and driveways and fix any cracks. Remove anything that might make you trip as you walk through a door, such as a raised step or threshold. Trim any bushes or trees on the path to your home. Use bright outdoor lighting. Clear any walking paths of anything that might make someone trip, such as rocks or tools. Regularly check to see if handrails are loose or broken. Make sure that both sides of any steps have handrails. Any raised decks and porches should have guardrails on the edges. Have any leaves, snow, or ice cleared regularly. Use sand or salt on walking paths during winter. Clean up any spills in your garage right away. This includes oil or grease spills. What can I do in the bathroom? Use night lights. Install grab bars by the toilet and in the tub and shower. Do not use towel bars as grab bars. Use non-skid mats or decals in the tub or shower. If you need to sit down in the shower, use a plastic, non-slip stool. Keep the floor dry. Clean up any water that spills on the floor as soon as it happens. Remove soap buildup in the tub  or shower regularly. Attach bath mats securely with double-sided non-slip rug tape. Do not have throw rugs and other things on the floor that can make you trip. What can I do in the bedroom? Use night lights. Make sure that you have a light by your bed that is easy to reach. Do not use any sheets or blankets that are too big for your bed. They should not hang down onto the floor. Have a firm chair that has side arms. You can use this for support while you get dressed. Do not have throw rugs and other things on the floor that can make you trip. What can I do in the kitchen? Clean up any spills right away. Avoid walking on wet floors. Keep items that you use a lot in easy-to-reach places. If you need to reach something above you, use a strong step stool that has a grab bar. Keep electrical cords out of the way. Do not use floor polish or wax that makes floors slippery. If you must use wax, use non-skid floor wax. Do not have throw rugs and other things on the floor that can make you trip. What can I do with my stairs? Do not leave any items on the stairs. Make sure that there are handrails on both sides of the stairs and use them. Fix handrails that are broken or loose. Make sure that handrails are as long as the stairways. Check  any carpeting to make sure that it is firmly attached to the stairs. Fix any carpet that is loose or worn. Avoid having throw rugs at the top or bottom of the stairs. If you do have throw rugs, attach them to the floor with carpet tape. Make sure that you have a light switch at the top of the stairs and the bottom of the stairs. If you do not have them, ask someone to add them for you. What else can I do to help prevent falls? Wear shoes that: Do not have high heels. Have rubber bottoms. Are comfortable and fit you well. Are closed at the toe. Do not wear sandals. If you use a stepladder: Make sure that it is fully opened. Do not climb a closed stepladder. Make  sure that both sides of the stepladder are locked into place. Ask someone to hold it for you, if possible. Clearly mark and make sure that you can see: Any grab bars or handrails. First and last steps. Where the edge of each step is. Use tools that help you move around (mobility aids) if they are needed. These include: Canes. Walkers. Scooters. Crutches. Turn on the lights when you go into a dark area. Replace any light bulbs as soon as they burn out. Set up your furniture so you have a clear path. Avoid moving your furniture around. If any of your floors are uneven, fix them. If there are any pets around you, be aware of where they are. Review your medicines with your doctor. Some medicines can make you feel dizzy. This can increase your chance of falling. Ask your doctor what other things that you can do to help prevent falls. This information is not intended to replace advice given to you by your health care provider. Make sure you discuss any questions you have with your health care provider. Document Released: 09/03/2009 Document Revised: 04/14/2016 Document Reviewed: 12/12/2014 Elsevier Interactive Patient Education  2017 [employer].

## 2021-10-08 NOTE — Progress Notes (Signed)
Subjective:   Erin Acosta is a 70 y.o. female who presents for Medicare Annual (Subsequent) preventive examination.  I connected with Asti today by telephone and verified that I am speaking with the correct person using two identifiers. Location patient: home Location provider: work Persons participating in the virtual visit: patient, Engineer, civil (consulting).    I discussed the limitations, risks, security and privacy concerns of performing an evaluation and management service by telephone and the availability of in person appointments. I also discussed with the patient that there may be a patient responsible charge related to this service. The patient expressed understanding and verbally consented to this telephonic visit.    Interactive audio and video telecommunications were attempted between this provider and patient, however failed, due to patient having technical difficulties OR patient did not have access to video capability.  We continued and completed visit with audio only.  Some vital signs may be absent or patient reported.   Time Spent with patient on telephone encounter: 40 minutes   Review of Systems     Cardiac Risk Factors include: advanced age (>82men, >22 women);hypertension;dyslipidemia     Objective:    Today's Vitals   10/08/21 1425  Weight: 142 lb (64.4 kg)  Height: 5' (1.524 m)   Body mass index is 27.73 kg/m.  Advanced Directives 10/08/2021 05/07/2020 06/02/2016  Does Patient Have a Medical Advance Directive? Yes Yes Yes  Type of Estate agent of [place];Living will Healthcare Power of [place];Living will Healthcare Power of [place];Living will  Does patient want to make changes to medical advance directive? - No - Patient declined No - Patient declined  Copy of Healthcare Power of Attorney in Chart? No - copy requested No - copy requested No - copy requested    Current Medications (verified) Outpatient Encounter Medications as of  10/08/2021  Medication Sig   aspirin EC 81 MG tablet Take 81 mg by mouth daily.   atorvastatin (LIPITOR) 40 MG tablet Take 40 mg by mouth daily.   clonazePAM (KLONOPIN) 1 MG tablet Take 1 tablet (1 mg total) by mouth 2 (two) times daily as needed.   escitalopram (LEXAPRO) 20 MG tablet TAKE 1 TABLET DAILY   fluticasone (FLONASE) 50 MCG/ACT nasal spray Place 2 sprays into both nostrils daily.   loratadine (CLARITIN) 10 MG tablet Take 1 tablet (10 mg total) by mouth daily.   minocycline (MINOCIN) 100 MG capsule Take by mouth daily.   Probiotic Product (PROBIOTIC DAILY PO) Take by mouth.   spironolactone (ALDACTONE) 25 MG tablet Take 25 mg by mouth daily.   valACYclovir (VALTREX) 500 MG tablet Take 1 tablet (500 mg total) by mouth daily.   [DISCONTINUED] atorvastatin (LIPITOR) 20 MG tablet Take 1 tablet by mouth daily.   [DISCONTINUED] azithromycin (ZITHROMAX Z-PAK) 250 MG tablet As directed   No facility-administered encounter medications on file as of 10/08/2021.    Allergies (verified) Nickel and Oxycodone-acetaminophen   History: Past Medical History:  Diagnosis Date   Allergy    Anxiety    Depression    Genital herpes    H/O cesarean section    Hypertension    Rotator cuff tear    Scoliosis    Past Surgical History:  Procedure Laterality Date   BILATERAL CARPAL TUNNEL RELEASE     BLEPHAROPLASTY     BREAST BIOPSY     unsure which breast   CESAREAN SECTION     FOOT SURGERY Right    mini face lift  TRIGGER FINGER RELEASE     tummy tuck     Family History  Problem Relation Age of Onset   Arthritis Other    Hyperlipidemia Other    Heart disease Other    Stroke Other    Hypertension Other    Social History   Socioeconomic History   Marital status: Widowed    Spouse name: Not on file   Number of children: Not on file   Years of education: Not on file   Highest education level: Not on file  Occupational History   Not on file  Tobacco Use   Smoking status:  Never   Smokeless tobacco: Never  Substance and Sexual Activity   Alcohol use: Yes    Alcohol/week: 1.0 standard drink    Types: 1 Standard drinks or equivalent per week    Comment: Wine in the evening   Drug use: No   Sexual activity: Not on file  Other Topics Concern   Not on file  Social History Narrative   Not on file   Social Determinants of Health   Financial Resource Strain: Low Risk    Difficulty of Paying Living Expenses: Not hard at all  Food Insecurity: No Food Insecurity   Worried About Programme researcher, broadcasting/film/video in the Last Year: Never true   Ran Out of Food in the Last Year: Never true  Transportation Needs: No Transportation Needs   Lack of Transportation (Medical): No   Lack of Transportation (Non-Medical): No  Physical Activity: Inactive   Days of Exercise per Week: 0 days   Minutes of Exercise per Session: 0 min  Stress: No Stress Concern Present   Feeling of Stress : Only a little  Social Connections: Socially Isolated   Frequency of Communication with Friends and Family: More than three times a week   Frequency of Social Gatherings with Friends and Family: More than three times a week   Attends Religious Services: Never   Database administrator or Organizations: No   Attends Banker Meetings: Never   Marital Status: Widowed    Tobacco Counseling Counseling given: Not Answered   Clinical Intake:  Pre-visit preparation completed: Yes  Pain : No/denies pain     BMI - recorded: 27.73 Nutritional Status: BMI 25 -29 Overweight Nutritional Risks: None Diabetes: No  How often do you need to have someone help you when you read instructions, pamphlets, or other written materials from your doctor or pharmacy?: 1 - Never  Diabetic?No  Interpreter Needed?: No  Information entered by :: Thomasenia Sales LPN   Activities of Daily Living In your present state of health, do you have any difficulty performing the following activities: 10/08/2021   Hearing? N  Vision? N  Difficulty concentrating or making decisions? N  Walking or climbing stairs? N  Dressing or bathing? N  Doing errands, shopping? N  Preparing Food and eating ? N  Using the Toilet? N  In the past six months, have you accidently leaked urine? Y  Do you have problems with loss of bowel control? N  Managing your Medications? N  Managing your Finances? N  Housekeeping or managing your Housekeeping? N  Some recent data might be hidden    Patient Care Team: Zola Button, Grayling Congress, DO as PCP - General (Family Medicine) Malva Cogan, MD as Referring Physician (Dermatology) Blondell Reveal, MD (Obstetrics and Gynecology) Beverely Pace, Osborn Coho, MD as Referring Physician (Cardiology) Jodi Geralds, MD as Consulting Physician (  Plastic Surgery)  Indicate any recent Medical Services you may have received from other than [facility] providers in the past year (date may be approximate).     Assessment:   This is a routine wellness examination for Erin Acosta.  Hearing/Vision screen Hearing Screening - Comments:: No issues Vision Screening - Comments:: Last eye exam-06/2021-Dr.Glenn  Dietary issues and exercise activities discussed: Current Exercise Habits: The patient does not participate in regular exercise at present, Exercise limited by: None identified   Goals Addressed             This Visit's Progress    Maintain healthy active lifestyle.   On track      Depression Screen PHQ 2/9 Scores 10/08/2021 04/06/2021 05/07/2020 04/21/2020 10/29/2019 06/14/2019 05/10/2018  PHQ - 2 Score 0 1 0 1 0 4 1  PHQ- 9 Score - 7 - - - 14 -    Fall Risk Fall Risk  10/08/2021 04/06/2021 05/07/2020 04/21/2020 05/10/2018  Falls in the past year? 0 0 0 0 No  Comment - - - - -  Number falls in past yr: 0 - 0 0 -  Injury with Fall? 0 - 0 0 -  Follow up Falls prevention discussed - Education provided;Falls prevention discussed Falls evaluation completed -    FALL RISK PREVENTION  PERTAINING TO THE HOME:  Any stairs in or around the home? No  Home free of loose throw rugs in walkways, pet beds, electrical cords, etc? Yes  Adequate lighting in your home to reduce risk of falls? Yes   ASSISTIVE DEVICES UTILIZED TO PREVENT FALLS:  Life alert? No  Use of a cane, walker or w/c? No  Grab bars in the bathroom? Yes  Shower chair or bench in shower? Yes  Elevated toilet seat or a handicapped toilet? No   TIMED UP AND GO:  Was the test performed? No . Phone visit   Cognitive Function:Normal cognitive status assessed by this Nurse Health Advisor. No abnormalities found.          Immunizations Immunization History  Administered Date(s) Administered   Fluad Quad(high Dose 65+) 07/15/2019   Influenza Split 08/05/2016, 09/18/2017   Influenza, High Dose Seasonal PF 08/01/2018   Influenza-Unspecified 08/05/2016, 09/18/2017, 08/19/2021   Moderna Covid-19 Vaccine Bivalent Booster 62yrs & up 10/01/2021   PFIZER(Purple Top)SARS-COV-2 Vaccination 01/25/2020, 02/15/2020, 08/21/2020, 02/26/2021   Pneumococcal Conjugate-13 06/02/2016   Tdap 08/01/2018   Zoster Recombinat (Shingrix) 02/16/2017, 05/19/2017   Zoster, Live 11/21/2013    TDAP status: Up to date  Flu Vaccine status: Up to date  Pneumococcal vaccine status: Due, Education has been provided regarding the importance of this vaccine. Advised may receive this vaccine at local pharmacy or Health Dept. Aware to provide a copy of the vaccination record if obtained from local pharmacy or Health Dept. Verbalized acceptance and understanding.  Covid-19 vaccine status: Completed vaccines  Qualifies for Shingles Vaccine? No   Zostavax completed Yes   Shingrix Completed?: Yes  Screening Tests Health Maintenance  Topic Date Due   URINE MICROALBUMIN  Never done   COLONOSCOPY (Pts 45-59yrs Insurance coverage will need to be confirmed)  Never done   DEXA SCAN  Never done   Pneumonia Vaccine 59+ Years old (2 - PPSV23  if available, else PCV20) 06/02/2017   MAMMOGRAM  07/20/2023   TETANUS/TDAP  08/01/2028   INFLUENZA VACCINE  Completed   COVID-19 Vaccine  Completed   Hepatitis C Screening  Completed   Zoster Vaccines- Shingrix  Completed   HPV  VACCINES  Aged Out    Health Maintenance  Health Maintenance Due  Topic Date Due   URINE MICROALBUMIN  Never done   COLONOSCOPY (Pts 45-49yrs Insurance coverage will need to be confirmed)  Never done   DEXA SCAN  Never done   Pneumonia Vaccine 79+ Years old (2 - PPSV23 if available, else PCV20) 06/02/2017    Colorectal cancer screening:Patient states she had at age 39 & it was normal-No report in chart.  Mammogram status: Completed bilateral 07/19/2021. Repeat every year  Bone Density status: Ordered today. Pt provided with contact info and advised to call to schedule appt.  Lung Cancer Screening: (Low Dose CT Chest recommended if Age 83-80 years, 30 pack-year currently smoking OR have quit w/in 15years.) does not qualify.     Additional Screening:  Hepatitis C Screening: Completed 05/08/2017  Vision Screening: Recommended annual ophthalmology exams for early detection of glaucoma and other disorders of the eye. Is the patient up to date with their annual eye exam?  Yes  Who is the provider or what is the name of the office in which the patient attends annual eye exams? Dr. Sherrine Maples   Dental Screening: Recommended annual dental exams for proper oral hygiene  Community Resource Referral / Chronic Care Management: CRR required this visit?  No   CCM required this visit?  No      Plan:     I have personally reviewed and noted the following in the patient's chart:   Medical and social history Use of alcohol, tobacco or illicit drugs  Current medications and supplements including opioid prescriptions.  Functional ability and status Nutritional status Physical activity Advanced directives List of other physicians Hospitalizations, surgeries,  and ER visits in previous 12 months Vitals Screenings to include cognitive, depression, and falls Referrals and appointments  In addition, I have reviewed and discussed with patient certain preventive protocols, quality metrics, and best practice recommendations. A written personalized care plan for preventive services as well as general preventive health recommendations were provided to patient.   Due to this being a telephonic visit, the after visit summary with patients personalized plan was offered to patient via mail or my-chart. Patient would like to access on my-chart.   Roanna Raider, LPN   [id number]  Nurse Health Advisor  Nurse Notes: None

## 2021-10-13 DIAGNOSIS — M12811 Other specific arthropathies, not elsewhere classified, right shoulder: Secondary | ICD-10-CM | POA: Diagnosis not present

## 2021-10-13 DIAGNOSIS — M25611 Stiffness of right shoulder, not elsewhere classified: Secondary | ICD-10-CM | POA: Diagnosis not present

## 2021-10-13 DIAGNOSIS — Z9889 Other specified postprocedural states: Secondary | ICD-10-CM | POA: Diagnosis not present

## 2021-10-19 DIAGNOSIS — M25611 Stiffness of right shoulder, not elsewhere classified: Secondary | ICD-10-CM | POA: Diagnosis not present

## 2021-10-19 DIAGNOSIS — M12811 Other specific arthropathies, not elsewhere classified, right shoulder: Secondary | ICD-10-CM | POA: Diagnosis not present

## 2021-10-19 DIAGNOSIS — Z9889 Other specified postprocedural states: Secondary | ICD-10-CM | POA: Diagnosis not present

## 2021-10-26 DIAGNOSIS — M12811 Other specific arthropathies, not elsewhere classified, right shoulder: Secondary | ICD-10-CM | POA: Diagnosis not present

## 2021-10-29 DIAGNOSIS — M12811 Other specific arthropathies, not elsewhere classified, right shoulder: Secondary | ICD-10-CM | POA: Diagnosis not present

## 2021-11-02 ENCOUNTER — Other Ambulatory Visit: Payer: Self-pay

## 2021-11-02 ENCOUNTER — Ambulatory Visit (HOSPITAL_BASED_OUTPATIENT_CLINIC_OR_DEPARTMENT_OTHER)
Admission: RE | Admit: 2021-11-02 | Discharge: 2021-11-02 | Disposition: A | Discharge status: Home / Self Care | Payer: Medicare Other | Source: Ambulatory Visit | Attending: Family Medicine | Admitting: Family Medicine

## 2021-11-02 DIAGNOSIS — Z780 Asymptomatic menopausal state: Secondary | ICD-10-CM

## 2021-11-02 DIAGNOSIS — M85851 Other specified disorders of bone density and structure, right thigh: Secondary | ICD-10-CM | POA: Diagnosis not present

## 2021-11-26 DIAGNOSIS — Z96611 Presence of right artificial shoulder joint: Secondary | ICD-10-CM | POA: Diagnosis not present

## 2021-11-26 DIAGNOSIS — M12811 Other specific arthropathies, not elsewhere classified, right shoulder: Secondary | ICD-10-CM | POA: Diagnosis not present

## 2021-12-14 DIAGNOSIS — Z20822 Contact with and (suspected) exposure to covid-19: Secondary | ICD-10-CM | POA: Diagnosis not present

## 2021-12-28 ENCOUNTER — Encounter: Payer: Self-pay | Admitting: Family Medicine

## 2021-12-28 ENCOUNTER — Telehealth (INDEPENDENT_AMBULATORY_CARE_PROVIDER_SITE_OTHER): Payer: Medicare Other | Admitting: Family Medicine

## 2021-12-28 DIAGNOSIS — J069 Acute upper respiratory infection, unspecified: Secondary | ICD-10-CM

## 2021-12-28 MED ORDER — AZELASTINE HCL 0.1 % NA SOLN: 2.0000 | Freq: Two times a day (BID) | NASAL | 1 refills | Status: DC

## 2021-12-28 MED ORDER — PREDNISONE 20 MG PO TABS: 40.0000 mg | ORAL_TABLET | Freq: Every day | ORAL | 0 refills | Status: AC

## 2021-12-28 MED ORDER — GUAIFENESIN ER 600 MG PO TB12: 1200.0000 mg | ORAL_TABLET | Freq: Two times a day (BID) | ORAL | 2 refills | Status: DC

## 2021-12-28 NOTE — Progress Notes (Signed)
Virtual Visit via Telephone Note  I connected with  Erin Acosta on 12/28/21 at  9:00 AM EST by telephone and verified that I am speaking with the correct person using two identifiers.   I discussed the limitations, risks, security and privacy concerns of performing an evaluation and management service by telephone and the availability of in person appointments. I also discussed with the patient that there may be a patient responsible charge related to this service. The patient expressed understanding and agreed to proceed.  Participating parties included in this telephone visit include: The patient and the nurse practitioner listed.  The patient is: At home I am: at [facility]   Subjective:    CC: URI symptoms  HPI: Erin Acosta is a 71 y.o. year old female presenting today via telephone visit to discuss URI symptoms.  Patient reports symptoms began 3 days ago (sore throat, headache, sinus pressure 6-8/10, ear ache, postnasal drainage. Reports environmental allergies and sinus infections a few times per year. She currently denies any fevers, chest pain, dyspnea, wheezing, GI/GU symptoms, loss of taste/smell.   She is going out of town on 2/14 for a 3 week trip to [place] and [place].     Past medical history, Surgical history, Family history not pertinant except as noted below, Social history, Allergies, and medications have been entered into the medical record, reviewed, and corrections made.   Review of Systems:  All review of systems negative except what is listed in the HPI  Objective:    General:  Patient speaking clearly in complete sentences. No shortness of breath noted.   Alert and oriented x3.   Normal judgment.  No apparent acute distress.  Impression and Recommendations:    1. Viral URI Given duration of 3 days, will continue with supportive measures, but add some prednisone, Astelin, and Mucinex to help given the  severity of sinus pressure. Continue supportive measures including rest, hydration, humidifier use, steam showers, warm compresses to sinuses, warm liquids with lemon and honey, and over-the-counter cough, cold, and analgesics as needed.  Patient aware of signs/symptoms requiring further/urgent evaluation.  If no significant improvement by Friday, she can call/message and I will send in antibiotics for her to start before her trip.   - azelastine (ASTELIN) 0.1 % nasal spray; Place 2 sprays into both nostrils 2 (two) times daily. Use in each nostril as directed  Dispense: 301 mL; Refill: 1 - predniSONE (DELTASONE) 20 MG tablet; Take 2 tablets (40 mg total) by mouth daily with breakfast for 5 days.  Dispense: 10 tablet; Refill: 0 - guaiFENesin (MUCINEX) 600 MG 12 hr tablet; Take 2 tablets (1,200 mg total) by mouth 2 (two) times daily.  Dispense: 30 tablet; Refill: 2   Follow-up if not improving by the end of the week or sooner if symptoms worsen before then.     I discussed the assessment and treatment plan with the patient. The patient was provided an opportunity to ask questions and all were answered. The patient agreed with the plan and demonstrated an understanding of the instructions.   The patient was advised to call back or seek an in-person evaluation if the symptoms worsen or if the condition fails to improve as anticipated.  I provided 20 minutes of non-face-to-face time during this TELEPHONE encounter.    Erin Dana, NP

## 2021-12-29 DIAGNOSIS — M25551 Pain in right hip: Secondary | ICD-10-CM | POA: Diagnosis not present

## 2021-12-29 DIAGNOSIS — M7061 Trochanteric bursitis, right hip: Secondary | ICD-10-CM | POA: Diagnosis not present

## 2021-12-30 DIAGNOSIS — Z20822 Contact with and (suspected) exposure to covid-19: Secondary | ICD-10-CM | POA: Diagnosis not present

## 2021-12-31 ENCOUNTER — Telehealth: Payer: Self-pay | Admitting: Family Medicine

## 2021-12-31 DIAGNOSIS — J0140 Acute pansinusitis, unspecified: Secondary | ICD-10-CM

## 2021-12-31 MED ORDER — AZITHROMYCIN 250 MG PO TABS: ORAL_TABLET | ORAL | 0 refills | Status: DC

## 2021-12-31 MED ORDER — BENZONATATE 200 MG PO CAPS
200.0000 mg | ORAL_CAPSULE | Freq: Two times a day (BID) | ORAL | 0 refills | Status: DC | PRN
Start: 2021-12-31 — End: 2022-04-07

## 2021-12-31 NOTE — Telephone Encounter (Signed)
Pt stated if she didn't feel better by today to call and she would prescribe an antibiotic. She is not feeling better and is hoping to get this medication, along with another medication she found in her cabinet (benzonatate) that seemed to help her. She will be going out of town Tuesday and will need this as soon as possible, please advise.

## 2021-12-31 NOTE — Telephone Encounter (Signed)
Pt notified of medications.

## 2021-12-31 NOTE — Telephone Encounter (Signed)
Patient not feeling any better. Will send in antibiotics for her to start given duration and upcoming trip. Continue supportive measures including rest, hydration, humidifier use, steam showers, warm compresses to sinuses, warm liquids with lemon and honey, and over-the-counter cough, cold, and analgesics as needed.

## 2022-01-11 ENCOUNTER — Encounter: Payer: Self-pay | Admitting: Family Medicine

## 2022-01-11 ENCOUNTER — Telehealth: Payer: Self-pay

## 2022-01-11 ENCOUNTER — Ambulatory Visit (INDEPENDENT_AMBULATORY_CARE_PROVIDER_SITE_OTHER): Payer: Medicare Other | Admitting: Family Medicine

## 2022-01-11 VITALS — BP 130/72 | HR 74 | Temp 98.0°F | Resp 16 | Ht 60.0 in | Wt 141.6 lb

## 2022-01-11 DIAGNOSIS — N393 Stress incontinence (female) (male): Secondary | ICD-10-CM

## 2022-01-11 LAB — POC URINALSYSI DIPSTICK (AUTOMATED)
Bilirubin, UA: NEGATIVE
Blood, UA: NEGATIVE
Glucose, UA: NEGATIVE
Ketones, UA: NEGATIVE
Leukocytes, UA: NEGATIVE
Nitrite, UA: NEGATIVE
Protein, UA: NEGATIVE
Spec Grav, UA: 1.01 (ref 1.010–1.025)
Urobilinogen, UA: 0.2 E.U./dL
pH, UA: 7.5 (ref 5.0–8.0)

## 2022-01-11 MED ORDER — SOLIFENACIN SUCCINATE 10 MG PO TABS: 10.0000 mg | ORAL_TABLET | Freq: Every day | ORAL | 1 refills | Status: DC

## 2022-01-11 NOTE — Progress Notes (Addendum)
Subjective:   By signing my name below, I, Erin Acosta, attest that this documentation has been prepared under the direction and in the presence of Erin Held, DO  01/11/2022     Patient ID: Erin Acosta, female    DOB: [birth year], 71 y.o.   MRN: [id number]  Chief Complaint  Patient presents with   Medication Problem    Here to talk about Medication    Medication Refill    HPI Patient is in today for a office visit.  She continues complaining of urinary leaking. She also finds herself leaking when coughing, sneezing, or lifting up heavy objects. She has last seen a GYN specialist, Dr. [name], a couple of years ago and was recommended to complete Kegel exercises but found no relief. She is requesting to take a medication to help manage her symptoms.  She is prefers not wearing urinary pads or adult diapers to manage her symptoms.  She also reports recently recovering from a sinus infection. She tested negative for Covid-19. She continues having breakouts on her face since last recovering from Covid-19. She is seeing a dermatologist but found no new treatments. She is applying facial cream to manage her symptoms and finds no relief. She has an upcomming appointment with another dermatologists to manage her symptoms.    Past Medical History:  Diagnosis Date   Allergy    Anxiety    Depression    Genital herpes    H/O cesarean section    Hypertension    Rotator cuff tear    Scoliosis     Past Surgical History:  Procedure Laterality Date   BILATERAL CARPAL TUNNEL RELEASE     BLEPHAROPLASTY     BREAST BIOPSY     unsure which breast   CESAREAN SECTION     FOOT SURGERY Right    mini face lift     TOTAL SHOULDER ARTHROPLASTY Right    [facility]   TRIGGER FINGER RELEASE     tummy tuck      Family History  Problem Relation Age of Onset   Arthritis Other    Hyperlipidemia Other    Heart disease Other    Stroke Other    Hypertension Other     Social History    Socioeconomic History   Marital status: Widowed    Spouse name: Not on file   Number of children: Not on file   Years of education: Not on file   Highest education level: Not on file  Occupational History   Not on file  Tobacco Use   Smoking status: Never   Smokeless tobacco: Never  Substance and Sexual Activity   Alcohol use: Yes    Alcohol/week: 1.0 standard drink    Types: 1 Standard drinks or equivalent per week    Comment: Wine in the evening   Drug use: No   Sexual activity: Not on file  Other Topics Concern   Not on file  Social History Narrative   Not on file   Social Determinants of Health   Financial Resource Strain: Low Risk    Difficulty of Paying Living Expenses: Not hard at all  Food Insecurity: No Food Insecurity   Worried About Charity fundraiser in the Last Year: Never true   [place] in the Last Year: Never true  Transportation Needs: No Transportation Needs   Lack of Transportation (Medical): No   Lack of Transportation (Non-Medical): No  Physical Activity: Inactive  Days of Exercise per Week: 0 days   Minutes of Exercise per Session: 0 min  Stress: No Stress Concern Present   Feeling of Stress : Only a little  Social Connections: Socially Isolated   Frequency of Communication with Friends and Family: More than three times a week   Frequency of Social Gatherings with Friends and Family: More than three times a week   Attends Religious Services: Never   Marine scientist or Organizations: No   Attends Archivist Meetings: Never   Marital Status: Widowed  Human resources officer Violence: Not At Risk   Fear of Current or Ex-Partner: No   Emotionally Abused: No   Physically Abused: No   Sexually Abused: No    Outpatient Medications Prior to Visit  Medication Sig Dispense Refill   aspirin EC 81 MG tablet Take 81 mg by mouth daily.     atorvastatin (LIPITOR) 40 MG tablet Take 40 mg by mouth daily.     azelastine (ASTELIN) 0.1  % nasal spray Place 2 sprays into both nostrils 2 (two) times daily. Use in each nostril as directed 301 mL 1   azithromycin (ZITHROMAX Z-PAK) 250 MG tablet Take 2 tablets (500 mg) on  Day 1,  followed by 1 tablet (250 mg) once daily on Days 2 through 5. 6 tablet 0   benzonatate (TESSALON) 200 MG capsule Take 1 capsule (200 mg total) by mouth 2 (two) times daily as needed for cough. 20 capsule 0   clonazePAM (KLONOPIN) 1 MG tablet Take 1 tablet (1 mg total) by mouth 2 (two) times daily as needed. 180 tablet 1   escitalopram (LEXAPRO) 20 MG tablet TAKE 1 TABLET DAILY 90 tablet 1   fluticasone (FLONASE) 50 MCG/ACT nasal spray Place 2 sprays into both nostrils daily. 16 g 6   guaiFENesin (MUCINEX) 600 MG 12 hr tablet Take 2 tablets (1,200 mg total) by mouth 2 (two) times daily. 30 tablet 2   loratadine (CLARITIN) 10 MG tablet Take 1 tablet (10 mg total) by mouth daily. 30 tablet 3   minocycline (MINOCIN) 100 MG capsule Take by mouth daily.     Probiotic Product (PROBIOTIC DAILY PO) Take by mouth.     spironolactone (ALDACTONE) 25 MG tablet Take 25 mg by mouth daily.     valACYclovir (VALTREX) 500 MG tablet Take 1 tablet (500 mg total) by mouth daily. 90 tablet 3   No facility-administered medications prior to visit.    Allergies  Allergen Reactions   Nickel Other (See Comments)    Redness and pain   Oxycodone-Acetaminophen Itching    Review of Systems  Constitutional:  Negative for fever and malaise/fatigue.  HENT:  Negative for congestion.   Eyes:  Negative for blurred vision.  Respiratory:  Negative for cough and shortness of breath.   Cardiovascular:  Negative for chest pain, palpitations and leg swelling.  Gastrointestinal:  Negative for vomiting.  Genitourinary:        (+)urinary leaking  Musculoskeletal:  Negative for back pain.  Skin:  Negative for rash.       (+)skin breakout on face  Neurological:  Negative for loss of consciousness and headaches.      Objective:     Physical Exam Vitals and nursing note reviewed.  Constitutional:      General: She is not in acute distress.    Appearance: Normal appearance. She is not ill-appearing.  HENT:     Head: Normocephalic and atraumatic.  Right Ear: External ear normal.     Left Ear: External ear normal.  Eyes:     Extraocular Movements: Extraocular movements intact.     Pupils: Pupils are equal, round, and reactive to light.  Cardiovascular:     Rate and Rhythm: Normal rate and regular rhythm.     Heart sounds: Normal heart sounds. No murmur heard.   No gallop.  Pulmonary:     Effort: Pulmonary effort is normal. No respiratory distress.     Breath sounds: Normal breath sounds. No wheezing or rales.  Skin:    General: Skin is warm and dry.     Comments: Rash on cheeks. Seeing dermatology to manage it.  Neurological:     Mental Status: She is alert and oriented to person, place, and time.  Psychiatric:        Behavior: Behavior normal.        Judgment: Judgment normal.    BP 130/72 (BP Location: Right Arm, Patient Position: Sitting, Cuff Size: Small)    Pulse 74    Temp 98 F (36.7 C) (Oral)    Resp 16    Ht 5' (1.524 m)    Wt 141 lb 9.6 oz (64.2 kg)    SpO2 97%    BMI 27.65 kg/m  Wt Readings from Last 3 Encounters:  01/11/22 141 lb 9.6 oz (64.2 kg)  10/08/21 142 lb (64.4 kg)  04/06/21 142 lb 6.4 oz (64.6 kg)    Diabetic Foot Exam - Simple   No data filed    Lab Results  Component Value Date   WBC 7.5 08/24/2017   HGB 15.1 (H) 08/24/2017   HCT 45.1 08/24/2017   PLT 268.0 08/24/2017   GLUCOSE 98 04/06/2021   CHOL 157 04/06/2021   TRIG 155.0 (H) 04/06/2021   HDL 51.80 04/06/2021   LDLDIRECT 73.0 04/21/2020   LDLCALC 74 04/06/2021   ALT 18 04/06/2021   AST 17 04/06/2021   NA 135 04/06/2021   K 4.6 04/06/2021   CL 101 04/06/2021   CREATININE 0.65 04/06/2021   BUN 19 04/06/2021   CO2 28 04/06/2021   TSH 1.43 08/24/2017   HGBA1C 5.9 04/06/2021    Lab Results  Component  Value Date   TSH 1.43 08/24/2017   Lab Results  Component Value Date   WBC 7.5 08/24/2017   HGB 15.1 (H) 08/24/2017   HCT 45.1 08/24/2017   MCV 91.3 08/24/2017   PLT 268.0 08/24/2017   Lab Results  Component Value Date   NA 135 04/06/2021   K 4.6 04/06/2021   CO2 28 04/06/2021   GLUCOSE 98 04/06/2021   BUN 19 04/06/2021   CREATININE 0.65 04/06/2021   BILITOT 0.7 04/06/2021   ALKPHOS 74 04/06/2021   AST 17 04/06/2021   ALT 18 04/06/2021   PROT 7.1 04/06/2021   ALBUMIN 4.4 04/06/2021   CALCIUM 9.4 04/06/2021   GFR 89.38 04/06/2021   Lab Results  Component Value Date   CHOL 157 04/06/2021   Lab Results  Component Value Date   HDL 51.80 04/06/2021   Lab Results  Component Value Date   LDLCALC 74 04/06/2021   Lab Results  Component Value Date   TRIG 155.0 (H) 04/06/2021   Lab Results  Component Value Date   CHOLHDL 3 04/06/2021   Lab Results  Component Value Date   HGBA1C 5.9 04/06/2021       Assessment & Plan:   Problem List Items Addressed This Visit  Unprioritized   Stress incontinence of urine - Primary    vesicare daily Consider uro /gyn      Relevant Medications   solifenacin (VESICARE) 10 MG tablet   Other Relevant Orders   POCT Urinalysis Dipstick (Automated) (Completed)     Meds ordered this encounter  Medications   solifenacin (VESICARE) 10 MG tablet    Sig: Take 1 tablet (10 mg total) by mouth daily.    Dispense:  30 tablet    Refill:  1    I, Erin Held, DO, personally preformed the services described in this documentation.  All medical record entries made by the scribe were at my direction and in my presence.  I have reviewed the chart and discharge instructions (if applicable) and agree that the record reflects my personal performance and is accurate and complete. 01/11/2022   I,Erin Acosta,acting as a scribe for Erin Held, DO.,have documented all relevant documentation on the behalf of Erin Held, DO,as directed by  Erin Held, DO while in the presence of Erin Held, DO.   Erin Held, DO

## 2022-01-11 NOTE — Assessment & Plan Note (Signed)
vesicare daily Consider [place] [employer]

## 2022-01-11 NOTE — Telephone Encounter (Signed)
Pt called stating when she went to the pharmacy her insurance denied coverage of the medication because the strength was too high. She says we will need to call insurance (likely a PA) to get this medication and dosage approved.

## 2022-01-11 NOTE — Telephone Encounter (Signed)
PA started and sent to plan. Key: [id number]

## 2022-01-11 NOTE — Patient Instructions (Signed)
Urinary Frequency, Adult ?Urinary frequency means urinating more often than usual. You may urinate every 1-2 hours even though you drink a normal amount of fluid and do not have a bladder infection or condition. Although you urinate more often than normal, the total amount of urine produced in a day is normal. ?With urinary frequency, you may have an urgent need to urinate often. The stress and anxiety of needing to find a bathroom quickly can make this urge worse. This condition may go away on its own, or you may need treatment at home. Home treatment may include bladder training, exercises, taking medicines, or making changes to your diet. ?Follow these instructions at home: ?Bladder health ?Your health care provider will tell you what to do to improve bladder health. You may be told to: ?Keep a bladder diary. Keep track of: ?What you eat and drink. ?How often you urinate. ?How much you urinate. ?Follow a bladder training program. This may include: ?Learning to delay going to the bathroom. ?Double urinating, also called voiding. This helps if you are not completely emptying your bladder. ?Scheduled voiding. ?Do Kegel exercises. Kegel exercises strengthen the muscles that help control urination, which may help the condition. ? ?Eating and drinking ?Follow instructions from your health care provider about eating or drinking restrictions. You may be told to: ?Avoid caffeine. ?Drink fewer fluids, especially alcohol. ?Avoid drinking in the evening. ?Avoid foods or drinks that may irritate the bladder. These include coffee, tea, soda, artificial sweeteners, citrus, tomato-based foods, and chocolate. ?Eat foods that help prevent or treat constipation. Constipation can make urinary frequency worse. You may need to take these actions to prevent or treat constipation: ?Drink enough fluid to keep your urine pale yellow. ?Take over-the-counter or prescription medicines. ?Eat foods that are high in fiber, such as beans, whole  grains, and fresh fruits and vegetables. ?Limit foods that are high in fat and processed sugars, such as fried or sweet foods. ?General instructions ?Take over-the-counter and prescription medicines only as told by your health care provider. ?Keep all follow-up visits. This is important. ?Contact a health care provider if: ?You start urinating more often. ?You feel pain or irritation when you urinate. ?You notice blood in your urine. ?Your urine looks cloudy. ?You develop a fever. ?You begin vomiting. ?Get help right away if: ?You are unable to urinate. ?Summary ?Urinary frequency means urinating more often than usual. With urinary frequency, you may urinate every 1-2 hours even though you drink a normal amount of fluid and do not have a bladder infection or other bladder condition. ?Your health care provider may recommend that you keep a bladder diary, follow a bladder training program, or make dietary changes. ?If told by your health care provider, do Kegel exercises to strengthen the muscles that help control urination. ?Take over-the-counter and prescription medicines only as told by your health care provider. ?Contact a health care provider if your symptoms do not improve or get worse. ?This information is not intended to replace advice given to you by your health care provider. Make sure you discuss any questions you have with your health care provider. ?Document Revised: 06/12/2020 Document Reviewed: 06/12/2020 ?Elsevier Patient Education ? 2022 [employer] ? ?

## 2022-01-12 ENCOUNTER — Telehealth: Payer: Self-pay

## 2022-01-12 DIAGNOSIS — N393 Stress incontinence (female) (male): Secondary | ICD-10-CM

## 2022-01-12 MED ORDER — TOLTERODINE TARTRATE ER 4 MG PO CP24: 4.0000 mg | ORAL_CAPSULE | Freq: Every day | ORAL | 5 refills | Status: DC

## 2022-01-12 MED ORDER — SOLIFENACIN SUCCINATE 5 MG PO TABS: 5.0000 mg | ORAL_TABLET | Freq: Every day | ORAL | 1 refills | Status: DC

## 2022-01-12 NOTE — Telephone Encounter (Signed)
Rx sent. Pt already aware we are sending different medication

## 2022-01-12 NOTE — Telephone Encounter (Signed)
New Rx sent. Made patient aware of new medication.

## 2022-01-12 NOTE — Addendum Note (Signed)
Addended by: Roxanne Gates on: 01/12/2022 11:05 AM   Modules accepted: Orders

## 2022-01-12 NOTE — Telephone Encounter (Signed)
Vesicare 5mg  not covered by insurance. She must try Detrol LA, Oxybutynin IR, Oxybutynin ER, or Trospium IR first.

## 2022-01-31 DIAGNOSIS — M1611 Unilateral primary osteoarthritis, right hip: Secondary | ICD-10-CM | POA: Diagnosis not present

## 2022-02-01 DIAGNOSIS — L705 Acne excoriee des jeunes filles: Secondary | ICD-10-CM | POA: Diagnosis not present

## 2022-02-07 ENCOUNTER — Telehealth (INDEPENDENT_AMBULATORY_CARE_PROVIDER_SITE_OTHER): Payer: Medicare Other | Admitting: Family Medicine

## 2022-02-07 ENCOUNTER — Encounter: Payer: Self-pay | Admitting: Family Medicine

## 2022-02-07 ENCOUNTER — Telehealth: Payer: Self-pay

## 2022-02-07 DIAGNOSIS — R32 Unspecified urinary incontinence: Secondary | ICD-10-CM | POA: Diagnosis not present

## 2022-02-07 MED ORDER — MIRABEGRON ER 50 MG PO TB24: 50.0000 mg | ORAL_TABLET | Freq: Every day | ORAL | 2 refills | Status: DC

## 2022-02-07 NOTE — Telephone Encounter (Signed)
PA initiated via Covermymeds; KEY: [id number]. Awaiting determination.  ?

## 2022-02-07 NOTE — Assessment & Plan Note (Signed)
Try myrbetriq----- if no releif --- refer to urogyn ?

## 2022-02-07 NOTE — Progress Notes (Addendum)
? ? ?MyChart Video Visit ? ? ? ?Virtual Visit via Video Note  ? ?This visit type was conducted due to national recommendations for restrictions regarding the COVID-19 Pandemic (e.g. social distancing) in an effort to limit this patient's exposure and mitigate transmission in our community. This patient is at least at moderate risk for complications without adequate follow up. This format is felt to be most appropriate for this patient at this time. Physical exam was limited by quality of the video and audio technology used for the visit. Erin Acosta was able to get the patient set up on a video visit. ? ?Patient location: Home Patient and provider in visit ?Provider location: Office ? ?I discussed the limitations of evaluation and management by telemedicine and the availability of in person appointments. The patient expressed understanding and agreed to proceed. ? ?Visit Date: 02/07/2022 ? ?Today's healthcare provider: [name], DO  ? ? ? ?Subjective:  ? ? Patient ID: Erin Acosta, female    DOB: [birth year], 71 y.o.   MRN: [id number] ? ?Chief Complaint  ?Patient presents with  ? Urinary Incontinence  ? Follow-up  ? ? ?HPI ?Patient is in today for a virtual office visit.  ? ?Patient complains of persistent urinary incontinence. She states that taking 4 MG of Tolterodine did not help her overactive bladder. She has been taking 10 MG of Solifenacin which she states has been helping some. Both medications causes her dryness in the mouth.  ? ?Past Medical History:  ?Diagnosis Date  ? Allergy   ? Anxiety   ? Depression   ? Genital herpes   ? H/O cesarean section   ? Hypertension   ? Rotator cuff tear   ? Scoliosis   ? ? ?Past Surgical History:  ?Procedure Laterality Date  ? BILATERAL CARPAL TUNNEL RELEASE    ? BLEPHAROPLASTY    ? BREAST BIOPSY    ? unsure which breast  ? CESAREAN SECTION    ? FOOT SURGERY Right   ? mini face lift    ? TOTAL SHOULDER ARTHROPLASTY Right   ? [facility]  ? TRIGGER FINGER  RELEASE    ? tummy tuck    ? ? ?Family History  ?Problem Relation Age of Onset  ? Arthritis Other   ? Hyperlipidemia Other   ? Heart disease Other   ? Stroke Other   ? Hypertension Other   ? ? ?Social History  ? ?Socioeconomic History  ? Marital status: Widowed  ?  Spouse name: Not on file  ? Number of children: Not on file  ? Years of education: Not on file  ? Highest education level: Not on file  ?Occupational History  ? Not on file  ?Tobacco Use  ? Smoking status: Never  ? Smokeless tobacco: Never  ?Substance and Sexual Activity  ? Alcohol use: Yes  ?  Alcohol/week: 1.0 standard drink  ?  Types: 1 Standard drinks or equivalent per week  ?  Comment: Wine in the evening  ? Drug use: No  ? Sexual activity: Not on file  ?Other Topics Concern  ? Not on file  ?Social History Narrative  ? Not on file  ? ?Social Determinants of Health  ? ?Financial Resource Strain: Low Risk   ? Difficulty of Paying Living Expenses: Not hard at all  ?Food Insecurity: No Food Insecurity  ? Worried About Programme researcher, broadcasting/film/video in the Last Year: Never true  ? Ran Out of Food in the Last  Year: Never true  ?Transportation Needs: No Transportation Needs  ? Lack of Transportation (Medical): No  ? Lack of Transportation (Non-Medical): No  ?Physical Activity: Inactive  ? Days of Exercise per Week: 0 days  ? Minutes of Exercise per Session: 0 min  ?Stress: No Stress Concern Present  ? Feeling of Stress : Only a little  ?Social Connections: Socially Isolated  ? Frequency of Communication with Friends and Family: More than three times a week  ? Frequency of Social Gatherings with Friends and Family: More than three times a week  ? Attends Religious Services: Never  ? Active Member of Clubs or Organizations: No  ? Attends Banker Meetings: Never  ? Marital Status: Widowed  ?Intimate Partner Violence: Not At Risk  ? Fear of Current or Ex-Partner: No  ? Emotionally Abused: No  ? Physically Abused: No  ? Sexually Abused: No  ? ? ?Outpatient  Medications Prior to Visit  ?Medication Sig Dispense Refill  ? aspirin EC 81 MG tablet Take 81 mg by mouth daily.    ? atorvastatin (LIPITOR) 40 MG tablet Take 40 mg by mouth daily.    ? azelastine (ASTELIN) 0.1 % nasal spray Place 2 sprays into both nostrils 2 (two) times daily. Use in each nostril as directed 301 mL 1  ? azithromycin (ZITHROMAX Z-PAK) 250 MG tablet Take 2 tablets (500 mg) on  Day 1,  followed by 1 tablet (250 mg) once daily on Days 2 through 5. 6 tablet 0  ? benzonatate (TESSALON) 200 MG capsule Take 1 capsule (200 mg total) by mouth 2 (two) times daily as needed for cough. 20 capsule 0  ? clonazePAM (KLONOPIN) 1 MG tablet Take 1 tablet (1 mg total) by mouth 2 (two) times daily as needed. 180 tablet 1  ? escitalopram (LEXAPRO) 20 MG tablet TAKE 1 TABLET DAILY 90 tablet 1  ? fluticasone (FLONASE) 50 MCG/ACT nasal spray Place 2 sprays into both nostrils daily. 16 g 6  ? guaiFENesin (MUCINEX) 600 MG 12 hr tablet Take 2 tablets (1,200 mg total) by mouth 2 (two) times daily. 30 tablet 2  ? loratadine (CLARITIN) 10 MG tablet Take 1 tablet (10 mg total) by mouth daily. 30 tablet 3  ? minocycline (MINOCIN) 100 MG capsule Take by mouth daily.    ? Probiotic Product (PROBIOTIC DAILY PO) Take by mouth.    ? spironolactone (ALDACTONE) 25 MG tablet Take 25 mg by mouth daily.    ? valACYclovir (VALTREX) 500 MG tablet Take 1 tablet (500 mg total) by mouth daily. 90 tablet 3  ? tolterodine (DETROL LA) 4 MG 24 hr capsule Take 1 capsule (4 mg total) by mouth daily. 30 capsule 5  ? ?No facility-administered medications prior to visit.  ? ? ?Allergies  ?Allergen Reactions  ? Nickel Other (See Comments)  ?  Redness and pain  ? Oxycodone-Acetaminophen Itching  ? ? ?Review of Systems  ?Constitutional:  Negative for fever and malaise/fatigue.  ?HENT:  Negative for congestion.   ?     (+) Dry Mouth  ?Eyes:  Negative for blurred vision.  ?Respiratory:  Negative for cough and shortness of breath.   ?Cardiovascular:   Negative for chest pain, palpitations and leg swelling.  ?Gastrointestinal:  Negative for vomiting.  ?Genitourinary:  Positive for frequency ((+) urinary incontinence).  ?Musculoskeletal:  Negative for back pain.  ?Skin:  Negative for rash.  ?Neurological:  Negative for loss of consciousness and headaches.  ? ?   ?Objective:  ?  ?  Physical Exam ?Vitals and nursing note reviewed.  ?Pulmonary:  ?   Effort: Pulmonary effort is normal.  ?Neurological:  ?   Mental Status: She is alert.  ? ? ?There were no vitals taken for this visit. ?Wt Readings from Last 3 Encounters:  ?01/11/22 141 lb 9.6 oz (64.2 kg)  ?10/08/21 142 lb (64.4 kg)  ?04/06/21 142 lb 6.4 oz (64.6 kg)  ? ? ?Diabetic Foot Exam - Simple   ?No data filed ?  ? ?Lab Results  ?Component Value Date  ? WBC 7.5 08/24/2017  ? HGB 15.1 (H) 08/24/2017  ? HCT 45.1 08/24/2017  ? PLT 268.0 08/24/2017  ? GLUCOSE 98 04/06/2021  ? CHOL 157 04/06/2021  ? TRIG 155.0 (H) 04/06/2021  ? HDL 51.80 04/06/2021  ? LDLDIRECT 73.0 04/21/2020  ? LDLCALC 74 04/06/2021  ? ALT 18 04/06/2021  ? AST 17 04/06/2021  ? NA 135 04/06/2021  ? K 4.6 04/06/2021  ? CL 101 04/06/2021  ? CREATININE 0.65 04/06/2021  ? BUN 19 04/06/2021  ? CO2 28 04/06/2021  ? TSH 1.43 08/24/2017  ? HGBA1C 5.9 04/06/2021  ? ? ?Lab Results  ?Component Value Date  ? TSH 1.43 08/24/2017  ? ?Lab Results  ?Component Value Date  ? WBC 7.5 08/24/2017  ? HGB 15.1 (H) 08/24/2017  ? HCT 45.1 08/24/2017  ? MCV 91.3 08/24/2017  ? PLT 268.0 08/24/2017  ? ?Lab Results  ?Component Value Date  ? NA 135 04/06/2021  ? K 4.6 04/06/2021  ? CO2 28 04/06/2021  ? GLUCOSE 98 04/06/2021  ? BUN 19 04/06/2021  ? CREATININE 0.65 04/06/2021  ? BILITOT 0.7 04/06/2021  ? ALKPHOS 74 04/06/2021  ? AST 17 04/06/2021  ? ALT 18 04/06/2021  ? PROT 7.1 04/06/2021  ? ALBUMIN 4.4 04/06/2021  ? CALCIUM 9.4 04/06/2021  ? GFR 89.38 04/06/2021  ? ?Lab Results  ?Component Value Date  ? CHOL 157 04/06/2021  ? ?Lab Results  ?Component Value Date  ? HDL 51.80  04/06/2021  ? ?Lab Results  ?Component Value Date  ? LDLCALC 74 04/06/2021  ? ?Lab Results  ?Component Value Date  ? TRIG 155.0 (H) 04/06/2021  ? ?Lab Results  ?Component Value Date  ? CHOLHDL 3 04/06/2021  ? ?Lab Resu

## 2022-02-15 NOTE — Telephone Encounter (Signed)
PA denied. Insufficient information- however PA questions were completed on Covermymeds, we can appeal however it requires Pt to come by to sign consent to complete appeal?  ?

## 2022-02-16 NOTE — Telephone Encounter (Signed)
Neche [id number] ? ?Approved from 01/17/22-11/20/2098 ? ?Patient notified to contact her pharmacy to rerun. ?

## 2022-02-21 DIAGNOSIS — M9904 Segmental and somatic dysfunction of sacral region: Secondary | ICD-10-CM | POA: Diagnosis not present

## 2022-02-21 DIAGNOSIS — M5417 Radiculopathy, lumbosacral region: Secondary | ICD-10-CM | POA: Diagnosis not present

## 2022-02-21 DIAGNOSIS — M62830 Muscle spasm of back: Secondary | ICD-10-CM | POA: Diagnosis not present

## 2022-02-21 DIAGNOSIS — M9903 Segmental and somatic dysfunction of lumbar region: Secondary | ICD-10-CM | POA: Diagnosis not present

## 2022-02-22 DIAGNOSIS — M1610 Unilateral primary osteoarthritis, unspecified hip: Secondary | ICD-10-CM | POA: Diagnosis not present

## 2022-02-22 DIAGNOSIS — M545 Low back pain, unspecified: Secondary | ICD-10-CM | POA: Diagnosis not present

## 2022-02-22 DIAGNOSIS — M5416 Radiculopathy, lumbar region: Secondary | ICD-10-CM | POA: Diagnosis not present

## 2022-02-22 DIAGNOSIS — M5136 Other intervertebral disc degeneration, lumbar region: Secondary | ICD-10-CM | POA: Diagnosis not present

## 2022-02-24 DIAGNOSIS — M5417 Radiculopathy, lumbosacral region: Secondary | ICD-10-CM | POA: Diagnosis not present

## 2022-02-24 DIAGNOSIS — M62830 Muscle spasm of back: Secondary | ICD-10-CM | POA: Diagnosis not present

## 2022-02-24 DIAGNOSIS — M9903 Segmental and somatic dysfunction of lumbar region: Secondary | ICD-10-CM | POA: Diagnosis not present

## 2022-02-24 DIAGNOSIS — M9904 Segmental and somatic dysfunction of sacral region: Secondary | ICD-10-CM | POA: Diagnosis not present

## 2022-02-25 DIAGNOSIS — M48061 Spinal stenosis, lumbar region without neurogenic claudication: Secondary | ICD-10-CM | POA: Diagnosis not present

## 2022-02-25 DIAGNOSIS — M5136 Other intervertebral disc degeneration, lumbar region: Secondary | ICD-10-CM | POA: Diagnosis not present

## 2022-02-25 DIAGNOSIS — M5126 Other intervertebral disc displacement, lumbar region: Secondary | ICD-10-CM | POA: Diagnosis not present

## 2022-02-25 DIAGNOSIS — M47816 Spondylosis without myelopathy or radiculopathy, lumbar region: Secondary | ICD-10-CM | POA: Diagnosis not present

## 2022-02-28 ENCOUNTER — Other Ambulatory Visit: Payer: Self-pay | Admitting: Family Medicine

## 2022-03-01 DIAGNOSIS — Z20822 Contact with and (suspected) exposure to covid-19: Secondary | ICD-10-CM | POA: Diagnosis not present

## 2022-03-02 DIAGNOSIS — Z20822 Contact with and (suspected) exposure to covid-19: Secondary | ICD-10-CM | POA: Diagnosis not present

## 2022-03-03 DIAGNOSIS — M62830 Muscle spasm of back: Secondary | ICD-10-CM | POA: Diagnosis not present

## 2022-03-03 DIAGNOSIS — M5417 Radiculopathy, lumbosacral region: Secondary | ICD-10-CM | POA: Diagnosis not present

## 2022-03-03 DIAGNOSIS — M9904 Segmental and somatic dysfunction of sacral region: Secondary | ICD-10-CM | POA: Diagnosis not present

## 2022-03-03 DIAGNOSIS — M9903 Segmental and somatic dysfunction of lumbar region: Secondary | ICD-10-CM | POA: Diagnosis not present

## 2022-03-10 DIAGNOSIS — M9903 Segmental and somatic dysfunction of lumbar region: Secondary | ICD-10-CM | POA: Diagnosis not present

## 2022-03-10 DIAGNOSIS — M5417 Radiculopathy, lumbosacral region: Secondary | ICD-10-CM | POA: Diagnosis not present

## 2022-03-10 DIAGNOSIS — M9904 Segmental and somatic dysfunction of sacral region: Secondary | ICD-10-CM | POA: Diagnosis not present

## 2022-03-10 DIAGNOSIS — M62830 Muscle spasm of back: Secondary | ICD-10-CM | POA: Diagnosis not present

## 2022-03-15 ENCOUNTER — Other Ambulatory Visit: Payer: Self-pay | Admitting: Family Medicine

## 2022-03-15 DIAGNOSIS — M48062 Spinal stenosis, lumbar region with neurogenic claudication: Secondary | ICD-10-CM | POA: Diagnosis not present

## 2022-03-15 DIAGNOSIS — F419 Anxiety disorder, unspecified: Secondary | ICD-10-CM

## 2022-03-16 DIAGNOSIS — M62830 Muscle spasm of back: Secondary | ICD-10-CM | POA: Diagnosis not present

## 2022-03-16 DIAGNOSIS — M9903 Segmental and somatic dysfunction of lumbar region: Secondary | ICD-10-CM | POA: Diagnosis not present

## 2022-03-16 DIAGNOSIS — M5417 Radiculopathy, lumbosacral region: Secondary | ICD-10-CM | POA: Diagnosis not present

## 2022-03-16 DIAGNOSIS — M9904 Segmental and somatic dysfunction of sacral region: Secondary | ICD-10-CM | POA: Diagnosis not present

## 2022-03-18 ENCOUNTER — Telehealth: Payer: Self-pay | Admitting: Family Medicine

## 2022-03-18 NOTE — Telephone Encounter (Signed)
Pt states the myrbetriq is wokring well, but was wondering if she could get sent a stronger [facility]. Please advise.  ?

## 2022-03-21 NOTE — Telephone Encounter (Signed)
Hello Dr.Lowne this is the highest dose  ?

## 2022-03-23 ENCOUNTER — Telehealth: Payer: Self-pay | Admitting: Family Medicine

## 2022-03-23 ENCOUNTER — Other Ambulatory Visit: Payer: Self-pay

## 2022-03-23 DIAGNOSIS — R32 Unspecified urinary incontinence: Secondary | ICD-10-CM

## 2022-03-23 MED ORDER — CETIRIZINE HCL 10 MG PO TABS: 10.0000 mg | ORAL_TABLET | Freq: Every day | ORAL | 0 refills | Status: DC

## 2022-03-23 MED ORDER — MIRABEGRON ER 50 MG PO TB24: 50.0000 mg | ORAL_TABLET | Freq: Every day | ORAL | 2 refills | Status: DC

## 2022-03-23 NOTE — Telephone Encounter (Signed)
Pt wants to increase Zyrtec? 10 MG is the max dose, correct?  ?

## 2022-03-23 NOTE — Telephone Encounter (Signed)
Pt would also like other rx [facility]. Pt is going out of town and needing both rxs filled as soon possible.  ? ?Medication:  ?mirabegron ER (MYRBETRIQ) 50 MG TB24 tablet  ? ? ?Preferred Pharmacy:  ?[facility] - [place], [place] - [address]  ? [address], [place] [address]  ?Phone:  [phone]  Fax:  [phone]  ? ? ?

## 2022-03-23 NOTE — Telephone Encounter (Signed)
Pt is wondering if she could get her zyrtec dosage upped. Please advise.  ? ?[facility] - [place], [place] - [address]  ? [address], [place] [address]  ?Phone:  [phone]  Fax:  [phone]  ?

## 2022-03-28 DIAGNOSIS — M48062 Spinal stenosis, lumbar region with neurogenic claudication: Secondary | ICD-10-CM | POA: Diagnosis not present

## 2022-03-28 DIAGNOSIS — I10 Essential (primary) hypertension: Secondary | ICD-10-CM | POA: Diagnosis not present

## 2022-03-28 DIAGNOSIS — M5416 Radiculopathy, lumbar region: Secondary | ICD-10-CM | POA: Diagnosis not present

## 2022-04-01 ENCOUNTER — Telehealth: Payer: Self-pay | Admitting: Family Medicine

## 2022-04-01 ENCOUNTER — Other Ambulatory Visit: Payer: Self-pay | Admitting: Family Medicine

## 2022-04-01 DIAGNOSIS — E785 Hyperlipidemia, unspecified: Secondary | ICD-10-CM

## 2022-04-01 MED ORDER — ATORVASTATIN CALCIUM 40 MG PO TABS: 40.0000 mg | ORAL_TABLET | Freq: Every day | ORAL | 0 refills | Status: DC

## 2022-04-01 NOTE — Telephone Encounter (Signed)
Refill sent.

## 2022-04-01 NOTE — Telephone Encounter (Signed)
Pt called wanting to know if she can get a 30 day supply of lipitor sent to the pharmacy since she is going out of town soon and she is completely out. ? ?Medication:  ? ?atorvastatin (LIPITOR) 40 MG tablet [id number]  ? ?Has the patient contacted their pharmacy? No. ?(If no, request that the patient contact the pharmacy for the refill.) ?(If yes, when and what did the pharmacy advise?) ? ?Preferred Pharmacy (with phone number or street name):  ? ?[facility] ?[address], [place], [place] [address] ?Phone: [phone] ? ?Agent: Please be advised that RX refills may take up to 3 business days. We ask that you follow-up with your pharmacy. ? ?

## 2022-04-01 NOTE — Telephone Encounter (Signed)
Medication on pt's list w/o authorizing provider. Okay to send?  ?

## 2022-04-07 ENCOUNTER — Encounter: Payer: Self-pay | Admitting: Family Medicine

## 2022-04-07 ENCOUNTER — Ambulatory Visit (INDEPENDENT_AMBULATORY_CARE_PROVIDER_SITE_OTHER): Payer: Medicare Other | Admitting: Family Medicine

## 2022-04-07 DIAGNOSIS — M48061 Spinal stenosis, lumbar region without neurogenic claudication: Secondary | ICD-10-CM | POA: Diagnosis not present

## 2022-04-07 MED ORDER — HYDROCODONE-ACETAMINOPHEN 5-325 MG PO TABS: 1.0000 | ORAL_TABLET | ORAL | 0 refills | Status: DC | PRN

## 2022-04-07 NOTE — Assessment & Plan Note (Signed)
Pt has app with surgeon to discuss possibility of surgery but needs pain meds for her 2 weeks [place] trip

## 2022-04-07 NOTE — Progress Notes (Signed)
Subjective:   By signing my name below, I, [name], attest that this documentation has been prepared under the direction and in the presence of Donato SchultzLowne Chase, Skyanne Welle R, DO  04/07/2022    Patient ID: Erin Acosta, female    DOB: [birth year], 71 y.o.   MRN: [id number]  Chief Complaint  Patient presents with   Back Pain    X2 weeks, pt states having a spine specialist putting steroids on both sides of her spine and pt states still having pain.     Back Pain Pertinent negatives include no abdominal pain, chest pain, dysuria, fever or headaches.  Patient is in today for a office visit.   She complains of back pain. She has received 2 steroid injections from another provider and was told in 10 days her pain will improve. She continues taking tylenol 2x daily to manage her pain. She prefers no surgery to manage her pain due to her upcomming vacations. She also has anxiety over back procedures. She was informed from her orthopedic surgeon she will most likely need a procedure on her back and right hip in the future to manage her pain.  She reports having Covid-19 earlier this year. She has taken anti-biotic and reports recovering from it. She found she soon developed rashes on her face. She is seeing a dermatologist to manage her symptoms.    Past Medical History:  Diagnosis Date   Allergy    Anxiety    Depression    Genital herpes    H/O cesarean section    Hypertension    Rotator cuff tear    Scoliosis     Past Surgical History:  Procedure Laterality Date   BILATERAL CARPAL TUNNEL RELEASE     BLEPHAROPLASTY     BREAST BIOPSY     unsure which breast   CESAREAN SECTION     FOOT SURGERY Right    mini face lift     TOTAL SHOULDER ARTHROPLASTY Right    [facility]   TRIGGER FINGER RELEASE     tummy tuck      Family History  Problem Relation Age of Onset   Arthritis Other    Hyperlipidemia Other    Heart disease Other    Stroke Other    Hypertension Other      Social History   Socioeconomic History   Marital status: Widowed    Spouse name: Not on file   Number of children: Not on file   Years of education: Not on file   Highest education level: Not on file  Occupational History   Not on file  Tobacco Use   Smoking status: Never   Smokeless tobacco: Never  Substance and Sexual Activity   Alcohol use: Yes    Alcohol/week: 1.0 standard drink    Types: 1 Standard drinks or equivalent per week    Comment: Wine in the evening   Drug use: No   Sexual activity: Not on file  Other Topics Concern   Not on file  Social History Narrative   Not on file   Social Determinants of Health   Financial Resource Strain: Low Risk    Difficulty of Paying Living Expenses: Not hard at all  Food Insecurity: No Food Insecurity   Worried About Programme researcher, broadcasting/film/videounning Out of Food in the Last Year: Never true   Ran Out of Food in the Last Year: Never true  Transportation Needs: No Transportation Needs   Lack of Transportation (Medical): No  Lack of Transportation (Non-Medical): No  Physical Activity: Inactive   Days of Exercise per Week: 0 days   Minutes of Exercise per Session: 0 min  Stress: No Stress Concern Present   Feeling of Stress : Only a little  Social Connections: Socially Isolated   Frequency of Communication with Friends and Family: More than three times a week   Frequency of Social Gatherings with Friends and Family: More than three times a week   Attends Religious Services: Never   Database administrator or Organizations: No   Attends Banker Meetings: Never   Marital Status: Widowed  Catering manager Violence: Not At Risk   Fear of Current or Ex-Partner: No   Emotionally Abused: No   Physically Abused: No   Sexually Abused: No    Outpatient Medications Prior to Visit  Medication Sig Dispense Refill   aspirin EC 81 MG tablet Take 81 mg by mouth daily.     atorvastatin (LIPITOR) 40 MG tablet Take 1 tablet (40 mg total) by mouth  daily. 30 tablet 0   azelastine (ASTELIN) 0.1 % nasal spray Place 2 sprays into both nostrils 2 (two) times daily. Use in each nostril as directed 301 mL 1   cetirizine (ZYRTEC) 10 MG tablet Take 1 tablet (10 mg total) by mouth daily. 90 tablet 0   clonazePAM (KLONOPIN) 1 MG tablet Take 1 tablet (1 mg total) by mouth 2 (two) times daily as needed. 180 tablet 1   escitalopram (LEXAPRO) 20 MG tablet TAKE 1 TABLET DAILY 90 tablet 1   fluticasone (FLONASE) 50 MCG/ACT nasal spray Place 2 sprays into both nostrils daily. 16 g 6   mirabegron ER (MYRBETRIQ) 50 MG TB24 tablet Take 1 tablet (50 mg total) by mouth daily. 30 tablet 2   Probiotic Product (PROBIOTIC DAILY PO) Take by mouth.     valACYclovir (VALTREX) 500 MG tablet TAKE 1 TABLET DAILY 90 tablet 3   azithromycin (ZITHROMAX Z-PAK) 250 MG tablet Take 2 tablets (500 mg) on  Day 1,  followed by 1 tablet (250 mg) once daily on Days 2 through 5. 6 tablet 0   benzonatate (TESSALON) 200 MG capsule Take 1 capsule (200 mg total) by mouth 2 (two) times daily as needed for cough. 20 capsule 0   guaiFENesin (MUCINEX) 600 MG 12 hr tablet Take 2 tablets (1,200 mg total) by mouth 2 (two) times daily. 30 tablet 2   minocycline (MINOCIN) 100 MG capsule Take by mouth daily.     spironolactone (ALDACTONE) 25 MG tablet Take 25 mg by mouth daily.     No facility-administered medications prior to visit.    Allergies  Allergen Reactions   Nickel Other (See Comments)    Redness and pain   Oxycodone-Acetaminophen Itching    Review of Systems  Constitutional:  Negative for fever and malaise/fatigue.  HENT:  Negative for congestion.   Eyes:  Negative for blurred vision.  Respiratory:  Negative for shortness of breath.   Cardiovascular:  Negative for chest pain, palpitations and leg swelling.  Gastrointestinal:  Negative for abdominal pain, blood in stool and nausea.  Genitourinary:  Negative for dysuria and frequency.  Musculoskeletal:  Positive for back pain.  Negative for falls.  Skin:  Positive for rash (on both cheeks and nose).  Neurological:  Negative for dizziness, loss of consciousness and headaches.  Endo/Heme/Allergies:  Negative for environmental allergies.  Psychiatric/Behavioral:  Negative for depression. The patient is not nervous/anxious.  Objective:    Physical Exam Vitals and nursing note reviewed.  Constitutional:      General: She is not in acute distress.    Appearance: Normal appearance. She is well-developed. She is not ill-appearing.  HENT:     Head: Normocephalic and atraumatic.     Right Ear: External ear normal.     Left Ear: External ear normal.  Eyes:     Extraocular Movements: Extraocular movements intact.     Conjunctiva/sclera: Conjunctivae normal.     Pupils: Pupils are equal, round, and reactive to light.  Neck:     Thyroid: No thyromegaly.     Vascular: No carotid bruit or JVD.  Cardiovascular:     Rate and Rhythm: Normal rate and regular rhythm.     Heart sounds: Normal heart sounds. No murmur heard.   No gallop.  Pulmonary:     Effort: Pulmonary effort is normal. No respiratory distress.     Breath sounds: Normal breath sounds. No wheezing or rales.  Chest:     Chest wall: No tenderness.  Musculoskeletal:        General: Tenderness present.     Cervical back: Normal range of motion and neck supple.  Skin:    General: Skin is warm and dry.  Neurological:     Mental Status: She is alert and oriented to person, place, and time.  Psychiatric:        Judgment: Judgment normal.    BP 120/68 (BP Location: Right Arm, Patient Position: Sitting, Cuff Size: Large)   Pulse 84   Temp 97.6 F (36.4 C) (Oral)   Resp 18   Wt 146 lb 9.6 oz (66.5 kg)   SpO2 97%   BMI 28.63 kg/m  Wt Readings from Last 3 Encounters:  04/07/22 146 lb 9.6 oz (66.5 kg)  01/11/22 141 lb 9.6 oz (64.2 kg)  10/08/21 142 lb (64.4 kg)    Diabetic Foot Exam - Simple   No data filed    Lab Results  Component Value  Date   WBC 7.5 08/24/2017   HGB 15.1 (H) 08/24/2017   HCT 45.1 08/24/2017   PLT 268.0 08/24/2017   GLUCOSE 98 04/06/2021   CHOL 157 04/06/2021   TRIG 155.0 (H) 04/06/2021   HDL 51.80 04/06/2021   LDLDIRECT 73.0 04/21/2020   LDLCALC 74 04/06/2021   ALT 18 04/06/2021   AST 17 04/06/2021   NA 135 04/06/2021   K 4.6 04/06/2021   CL 101 04/06/2021   CREATININE 0.65 04/06/2021   BUN 19 04/06/2021   CO2 28 04/06/2021   TSH 1.43 08/24/2017   HGBA1C 5.9 04/06/2021    Lab Results  Component Value Date   TSH 1.43 08/24/2017   Lab Results  Component Value Date   WBC 7.5 08/24/2017   HGB 15.1 (H) 08/24/2017   HCT 45.1 08/24/2017   MCV 91.3 08/24/2017   PLT 268.0 08/24/2017   Lab Results  Component Value Date   NA 135 04/06/2021   K 4.6 04/06/2021   CO2 28 04/06/2021   GLUCOSE 98 04/06/2021   BUN 19 04/06/2021   CREATININE 0.65 04/06/2021   BILITOT 0.7 04/06/2021   ALKPHOS 74 04/06/2021   AST 17 04/06/2021   ALT 18 04/06/2021   PROT 7.1 04/06/2021   ALBUMIN 4.4 04/06/2021   CALCIUM 9.4 04/06/2021   GFR 89.38 04/06/2021   Lab Results  Component Value Date   CHOL 157 04/06/2021   Lab Results  Component Value Date  HDL 51.80 04/06/2021   Lab Results  Component Value Date   LDLCALC 74 04/06/2021   Lab Results  Component Value Date   TRIG 155.0 (H) 04/06/2021   Lab Results  Component Value Date   CHOLHDL 3 04/06/2021   Lab Results  Component Value Date   HGBA1C 5.9 04/06/2021       Assessment & Plan:   Problem List Items Addressed This Visit       Unprioritized   Spinal stenosis of lumbar region    Pt has app with surgeon to discuss possibility of surgery but needs pain meds for her 2 [place]       Relevant Medications   HYDROcodone-acetaminophen (NORCO/VICODIN) 5-325 MG tablet     Meds ordered this encounter  Medications   DISCONTD: HYDROcodone-acetaminophen (NORCO/VICODIN) 5-325 MG tablet    Sig: Take 1 tablet by mouth every  4 (four) hours as needed for moderate pain.    Dispense:  30 tablet    Refill:  0   HYDROcodone-acetaminophen (NORCO/VICODIN) 5-325 MG tablet    Sig: Take 1 tablet by mouth every 4 (four) hours as needed for moderate pain.    Dispense:  30 tablet    Refill:  0    I, [name], DO, personally preformed the services described in this documentation.  All medical record entries made by the scribe were at my direction and in my presence.  I have reviewed the chart and discharge instructions (if applicable) and agree that the record reflects my personal performance and is accurate and complete. 04/07/2022   I,[name],acting as a scribe for [name], DO.,have documented all relevant documentation on the behalf of [name], DO,as directed by  [name], DO while in the presence of [name], DO.   [name], DO

## 2022-04-07 NOTE — Patient Instructions (Signed)

## 2022-05-02 DIAGNOSIS — M48062 Spinal stenosis, lumbar region with neurogenic claudication: Secondary | ICD-10-CM | POA: Diagnosis not present

## 2022-05-10 ENCOUNTER — Other Ambulatory Visit: Payer: Self-pay | Admitting: Family Medicine

## 2022-05-11 DIAGNOSIS — M48062 Spinal stenosis, lumbar region with neurogenic claudication: Secondary | ICD-10-CM | POA: Diagnosis not present

## 2022-05-13 ENCOUNTER — Telehealth: Payer: Self-pay | Admitting: Family Medicine

## 2022-05-13 MED ORDER — ATORVASTATIN CALCIUM 40 MG PO TABS: ORAL_TABLET | ORAL | 1 refills | Status: DC

## 2022-05-13 NOTE — Telephone Encounter (Signed)
Rx sent 

## 2022-05-17 DIAGNOSIS — M48062 Spinal stenosis, lumbar region with neurogenic claudication: Secondary | ICD-10-CM | POA: Diagnosis not present

## 2022-05-17 DIAGNOSIS — Z7409 Other reduced mobility: Secondary | ICD-10-CM | POA: Diagnosis not present

## 2022-05-17 DIAGNOSIS — Z789 Other specified health status: Secondary | ICD-10-CM | POA: Diagnosis not present

## 2022-06-03 DIAGNOSIS — M48062 Spinal stenosis, lumbar region with neurogenic claudication: Secondary | ICD-10-CM | POA: Diagnosis not present

## 2022-06-07 DIAGNOSIS — M48062 Spinal stenosis, lumbar region with neurogenic claudication: Secondary | ICD-10-CM | POA: Diagnosis not present

## 2022-06-09 DIAGNOSIS — M48062 Spinal stenosis, lumbar region with neurogenic claudication: Secondary | ICD-10-CM | POA: Diagnosis not present

## 2022-06-11 ENCOUNTER — Other Ambulatory Visit: Payer: Self-pay | Admitting: Family Medicine

## 2022-06-11 DIAGNOSIS — R32 Unspecified urinary incontinence: Secondary | ICD-10-CM

## 2022-06-15 DIAGNOSIS — M48062 Spinal stenosis, lumbar region with neurogenic claudication: Secondary | ICD-10-CM | POA: Diagnosis not present

## 2022-06-21 DIAGNOSIS — S91332A Puncture wound without foreign body, left foot, initial encounter: Secondary | ICD-10-CM | POA: Diagnosis not present

## 2022-06-24 ENCOUNTER — Other Ambulatory Visit: Payer: Self-pay | Admitting: Family Medicine

## 2022-06-24 DIAGNOSIS — F411 Generalized anxiety disorder: Secondary | ICD-10-CM

## 2022-06-24 MED ORDER — CLONAZEPAM 1 MG PO TABS: 1.0000 mg | ORAL_TABLET | Freq: Two times a day (BID) | ORAL | 1 refills | Status: DC | PRN

## 2022-06-24 NOTE — Telephone Encounter (Signed)
Requesting: Klonopin Contract: 04/06/2020 UDS: 04/06/2020 Last OV: 04/07/2022 Next OV: N/A Last Refill: 07/05/2021, #180--1 RF Database:   Please advise

## 2022-06-24 NOTE — Telephone Encounter (Signed)
Medication: clonazePAM (KLONOPIN) 1 MG tablet  Has the patient contacted their pharmacy? Yes.     Preferred Pharmacy (with phone number or street name):  [facility] - [place], [place] - [address]  [address] [place] [address]  Phone:  [phone]  Fax:  [phone]   Agent: Please be advised that RX refills may take up to 3 business days. We ask that you follow-up with your pharmacy.

## 2022-06-28 DIAGNOSIS — Z7409 Other reduced mobility: Secondary | ICD-10-CM | POA: Diagnosis not present

## 2022-06-28 DIAGNOSIS — Z789 Other specified health status: Secondary | ICD-10-CM | POA: Diagnosis not present

## 2022-06-30 DIAGNOSIS — Z789 Other specified health status: Secondary | ICD-10-CM | POA: Diagnosis not present

## 2022-06-30 DIAGNOSIS — Z7409 Other reduced mobility: Secondary | ICD-10-CM | POA: Diagnosis not present

## 2022-07-05 DIAGNOSIS — Z789 Other specified health status: Secondary | ICD-10-CM | POA: Diagnosis not present

## 2022-07-05 DIAGNOSIS — Z7409 Other reduced mobility: Secondary | ICD-10-CM | POA: Diagnosis not present

## 2022-07-06 ENCOUNTER — Other Ambulatory Visit (HOSPITAL_BASED_OUTPATIENT_CLINIC_OR_DEPARTMENT_OTHER): Payer: Self-pay | Admitting: Family Medicine

## 2022-07-06 DIAGNOSIS — Z1231 Encounter for screening mammogram for malignant neoplasm of breast: Secondary | ICD-10-CM

## 2022-07-07 DIAGNOSIS — Z789 Other specified health status: Secondary | ICD-10-CM | POA: Diagnosis not present

## 2022-07-07 DIAGNOSIS — Z7409 Other reduced mobility: Secondary | ICD-10-CM | POA: Diagnosis not present

## 2022-07-11 DIAGNOSIS — M48062 Spinal stenosis, lumbar region with neurogenic claudication: Secondary | ICD-10-CM | POA: Diagnosis not present

## 2022-07-12 DIAGNOSIS — Z789 Other specified health status: Secondary | ICD-10-CM | POA: Diagnosis not present

## 2022-07-12 DIAGNOSIS — Z7409 Other reduced mobility: Secondary | ICD-10-CM | POA: Diagnosis not present

## 2022-07-20 ENCOUNTER — Encounter (HOSPITAL_BASED_OUTPATIENT_CLINIC_OR_DEPARTMENT_OTHER): Payer: Self-pay

## 2022-07-20 ENCOUNTER — Ambulatory Visit (HOSPITAL_BASED_OUTPATIENT_CLINIC_OR_DEPARTMENT_OTHER)
Admission: RE | Admit: 2022-07-20 | Discharge: 2022-07-20 | Disposition: A | Discharge status: Home / Self Care | Payer: Medicare Other | Source: Ambulatory Visit | Attending: Family Medicine | Admitting: Family Medicine

## 2022-07-20 DIAGNOSIS — Z1231 Encounter for screening mammogram for malignant neoplasm of breast: Secondary | ICD-10-CM | POA: Insufficient documentation

## 2022-07-20 DIAGNOSIS — M48062 Spinal stenosis, lumbar region with neurogenic claudication: Secondary | ICD-10-CM | POA: Diagnosis not present

## 2022-07-20 DIAGNOSIS — M1611 Unilateral primary osteoarthritis, right hip: Secondary | ICD-10-CM | POA: Diagnosis not present

## 2022-07-27 DIAGNOSIS — R531 Weakness: Secondary | ICD-10-CM | POA: Diagnosis not present

## 2022-07-27 DIAGNOSIS — E782 Mixed hyperlipidemia: Secondary | ICD-10-CM | POA: Diagnosis not present

## 2022-07-27 DIAGNOSIS — E663 Overweight: Secondary | ICD-10-CM | POA: Diagnosis not present

## 2022-07-27 DIAGNOSIS — R0989 Other specified symptoms and signs involving the circulatory and respiratory systems: Secondary | ICD-10-CM | POA: Diagnosis not present

## 2022-07-27 DIAGNOSIS — I2510 Atherosclerotic heart disease of native coronary artery without angina pectoris: Secondary | ICD-10-CM | POA: Diagnosis not present

## 2022-07-27 DIAGNOSIS — I358 Other nonrheumatic aortic valve disorders: Secondary | ICD-10-CM | POA: Diagnosis not present

## 2022-07-27 DIAGNOSIS — I10 Essential (primary) hypertension: Secondary | ICD-10-CM | POA: Diagnosis not present

## 2022-07-28 DIAGNOSIS — M48062 Spinal stenosis, lumbar region with neurogenic claudication: Secondary | ICD-10-CM | POA: Diagnosis not present

## 2022-07-28 DIAGNOSIS — M5416 Radiculopathy, lumbar region: Secondary | ICD-10-CM | POA: Diagnosis not present

## 2022-07-28 DIAGNOSIS — M7062 Trochanteric bursitis, left hip: Secondary | ICD-10-CM | POA: Diagnosis not present

## 2022-07-28 DIAGNOSIS — M1611 Unilateral primary osteoarthritis, right hip: Secondary | ICD-10-CM | POA: Diagnosis not present

## 2022-07-29 DIAGNOSIS — I2510 Atherosclerotic heart disease of native coronary artery without angina pectoris: Secondary | ICD-10-CM | POA: Diagnosis not present

## 2022-08-04 ENCOUNTER — Encounter: Payer: Self-pay | Admitting: Family Medicine

## 2022-08-04 ENCOUNTER — Ambulatory Visit (INDEPENDENT_AMBULATORY_CARE_PROVIDER_SITE_OTHER): Payer: Medicare Other | Admitting: Family Medicine

## 2022-08-04 VITALS — BP 140/78 | HR 68 | Temp 98.1°F | Resp 18 | Ht 60.0 in | Wt 151.0 lb

## 2022-08-04 DIAGNOSIS — I10 Essential (primary) hypertension: Secondary | ICD-10-CM | POA: Diagnosis not present

## 2022-08-04 DIAGNOSIS — Z7982 Long term (current) use of aspirin: Secondary | ICD-10-CM | POA: Diagnosis not present

## 2022-08-04 DIAGNOSIS — Z79899 Other long term (current) drug therapy: Secondary | ICD-10-CM | POA: Diagnosis not present

## 2022-08-04 DIAGNOSIS — E785 Hyperlipidemia, unspecified: Secondary | ICD-10-CM | POA: Diagnosis not present

## 2022-08-04 DIAGNOSIS — M48062 Spinal stenosis, lumbar region with neurogenic claudication: Secondary | ICD-10-CM | POA: Diagnosis not present

## 2022-08-04 DIAGNOSIS — Z23 Encounter for immunization: Secondary | ICD-10-CM | POA: Diagnosis not present

## 2022-08-04 DIAGNOSIS — M48061 Spinal stenosis, lumbar region without neurogenic claudication: Secondary | ICD-10-CM | POA: Diagnosis not present

## 2022-08-04 DIAGNOSIS — I6523 Occlusion and stenosis of bilateral carotid arteries: Secondary | ICD-10-CM | POA: Diagnosis not present

## 2022-08-04 DIAGNOSIS — I2510 Atherosclerotic heart disease of native coronary artery without angina pectoris: Secondary | ICD-10-CM | POA: Diagnosis not present

## 2022-08-04 DIAGNOSIS — M1611 Unilateral primary osteoarthritis, right hip: Secondary | ICD-10-CM

## 2022-08-04 MED ORDER — HYDROCODONE-ACETAMINOPHEN 5-325 MG PO TABS: 1.0000 | ORAL_TABLET | ORAL | 0 refills | Status: DC | PRN

## 2022-08-04 NOTE — Patient Instructions (Signed)
Spinal Stenosis  Spinal stenosis is a condition that happens when the spinal canal narrows. The spinal canal is the space between the bones of your spine (vertebrae). This narrowing puts pressure on the spinal cord and nerves that exit the spine and run down the arms or legs. When nerves exiting the spine are pinched, it can cause pain, numbness, or weakness in the arms or legs. Spinal stenosis can affect the vertebrae in the neck, upper back, and lower back. Spinal stenosis can range from mild to severe. What are the causes? This condition is caused by areas of bone pushing into the spinal canal. This condition may be present at birth (congenital), or it may be caused by: Slow breakdown of your vertebrae (spinal degeneration). This usually starts between 50 and 60 years of age. Injury (trauma) to your spine. Previous spinal surgery. Tumors in your spine. Calcium deposits in your spine. What increases the risk? The following factors may make you more likely to develop this condition: Being older than age 50. Being born with an abnormally shaped spine (congenitalspinal deformity), such as scoliosis. Having arthritis. What are the signs or symptoms? Symptoms of this condition include: Pain in the neck or back that is generally worse with activities, particularly when standing or walking. Numbness, tingling, hot or cold sensations, weakness, or tiredness (fatigue) in your arms or legs. This can happen in one arm or leg, or both. Pain going from the buttock, down the thigh, and to the calf (sciatica). This can happen in one or both legs. Falling frequently. Foot drop. This is when you have trouble lifting the front part of your foot and it drags on the ground when you walk. This can lead to muscle weakness. In more severe cases, you may develop: Problems having a bowel movement or urinating. Difficulty having sex. Loss of feeling in your legs and inability to walk. Symptoms may come on slowly  and get worse over time. In some cases, there are no symptoms. How is this diagnosed? This condition is diagnosed based on your medical history and a physical exam. You may also have tests, such as an X-ray, CT scan, or MRI. How is this treated? Treatment for this condition often focuses on managing your pain and any other symptoms. Treatment may include: Practicing good posture to lessen pressure on your nerves. Exercises to strengthen muscles, build endurance, improve balance, and maintain range of motion. This may include physical therapy to restore movement and strength to your back. Losing weight, if needed. Medicines to reduce inflammation or pain. This may include a medicine that is injected into your spine (steroidinjection). Assistive devices, such as a corset or brace. In some cases, surgery may be needed. The most common procedure is decompression laminectomy. This removes excess bone that puts pressure on your nerve roots. Follow these instructions at home: Managing pain, stiffness, and swelling  Practice good posture. If you were given a brace or a corset, wear it as told by your health care provider. Maintain a healthy weight. Talk with your health care provider if you need help losing weight. If directed, apply heat to the affected area as often as told by your health care provider. Use the heat source that your health care provider recommends, such as a moist heat pack or a heating pad. Place a towel between your skin and the heat source. Leave the heat on for 20-30 minutes. If your skin turns bright red, remove the heat right away to prevent burns. The risk   of burns is higher if you cannot feel pain, heat, or cold. Activity Do all exercises and stretches as told by your health care provider. Do not do any activities that cause pain. You may have to avoid lifting. Ask your health care provider how much you can safely lift. Return to your normal activities as told by your  health care provider. Ask your health care provider what activities are safe for you. General instructions Take over-the-counter and prescription medicines only as told by your health care provider. Do not use any products that contain nicotine or tobacco. These products include cigarettes, chewing tobacco, and vaping devices, such as e-cigarettes. If you need help quitting, ask your health care provider. Eat a healthy diet. This includes plenty of fruits and vegetables, whole grains, and low-fat (lean) protein. Where to find more information National Institute of Arthritis and Musculoskeletal and Skin Diseases: www.niams.nih.gov Contact a health care provider if: Your symptoms do not get better or they get worse. You have a fever. Get help right away if: You have new pain or symptoms of severe pain, such as: New or worsening pain in your neck or upper back. Severe pain that cannot be controlled with medicines. A severe headache that gets worse when you stand. You are dizzy. You have vision problems, such as blurred vision or double vision. You have nausea or vomiting. You develop new or worsening numbness or tingling in your back or legs. You lose control of your bowels or bladder. You have pain, redness, swelling, or warmth in your arm or leg. These symptoms may be an emergency. Get help right away. Call 911. Do not wait to see if the symptoms will go away. Do not drive yourself to the hospital. Summary Spinal stenosis is a condition that happens when the spinal canal narrows, putting pressure on the spinal cord or nerves that exit the vertebrae. This condition is caused by areas of bone pushing into the spinal canal. Spinal stenosis can cause numbness, weakness, or pain in the buttocks, neck, back, arms, and legs. This condition is usually diagnosed with your medical history, a physical exam, and tests, such as an X-ray, CT scan, or MRI. This information is not intended to replace  advice given to you by your health care provider. Make sure you discuss any questions you have with your health care provider. Document Revised: 02/01/2022 Document Reviewed: 02/01/2022 Elsevier Patient Education  2023 Elsevier Inc.  

## 2022-08-04 NOTE — Progress Notes (Signed)
Subjective:   By signing my name below, I, [name], attest that this documentation has been prepared under the direction and in the presence of [name], DO  08/04/2022    Patient ID: Erin Acosta, female    DOB: [birth year], 71 y.o.   MRN: [id number]  Chief Complaint  Patient presents with   Pain Management    Pt states here to discuss pain management for hip and back between surgeries.     HPI Patient is in today for a office visit.   She has an upcomming right hip replacement procedure in January 2024 at [facility]. After she recovers from her hip procedure she is planning on a back procedure to help manage her back pain. She is currently taking tylenol to manage her symptoms and finds no relief. She has tried physical therapy in the past and found mild relief. She has more pain in the back of her legs compared to her lower back. She has seen a pain management specialist but found they could not prescribe medication to help manage her specific pain. She is requesting medication to help manage her pain until her hip procedure in January, 2024.    Past Medical History:  Diagnosis Date   Allergy    Anxiety    Depression    Genital herpes    H/O cesarean section    Hypertension    Rotator cuff tear    Scoliosis     Past Surgical History:  Procedure Laterality Date   BILATERAL CARPAL TUNNEL RELEASE     BLEPHAROPLASTY     BREAST BIOPSY     unsure which breast   CESAREAN SECTION     FOOT SURGERY Right    mini face lift     TOTAL SHOULDER ARTHROPLASTY Right    [facility]   TRIGGER FINGER RELEASE     tummy tuck      Family History  Problem Relation Age of Onset   Arthritis Other    Hyperlipidemia Other    Heart disease Other    Stroke Other    Hypertension Other     Social History   Socioeconomic History   Marital status: Widowed    Spouse name: Not on file   Number of children: Not on file   Years of education: Not on  file   Highest education level: Not on file  Occupational History   Not on file  Tobacco Use   Smoking status: Never   Smokeless tobacco: Never  Substance and Sexual Activity   Alcohol use: Yes    Alcohol/week: 1.0 standard drink of alcohol    Types: 1 Standard drinks or equivalent per week    Comment: Wine in the evening   Drug use: No   Sexual activity: Not on file  Other Topics Concern   Not on file  Social History Narrative   Not on file   Social Determinants of Health   Financial Resource Strain: Low Risk  (10/08/2021)   Overall Financial Resource Strain (CARDIA)    Difficulty of Paying Living Expenses: Not hard at all  Food Insecurity: No Food Insecurity (10/08/2021)   Hunger Vital Sign    Worried About Running Out of Food in the Last Year: Never true    Ran Out of Food in the Last Year: Never true  Transportation Needs: No Transportation Needs (10/08/2021)   PRAPARE - Administrator, Civil Service (Medical): No  Lack of Transportation (Non-Medical): No  Physical Activity: Inactive (10/08/2021)   Exercise Vital Sign    Days of Exercise per Week: 0 days    Minutes of Exercise per Session: 0 min  Stress: No Stress Concern Present (10/08/2021)   [employer] of Occupational Health - Occupational Stress Questionnaire    Feeling of Stress : Only a little  Social Connections: Socially Isolated (10/08/2021)   Social Connection and Isolation Panel [NHANES]    Frequency of Communication with Friends and Family: More than three times a week    Frequency of Social Gatherings with Friends and Family: More than three times a week    Attends Religious Services: Never    Database administrator or Organizations: No    Attends Banker Meetings: Never    Marital Status: Widowed  Intimate Partner Violence: Not At Risk (10/08/2021)   Humiliation, Afraid, Rape, and Kick questionnaire    Fear of Current or Ex-Partner: No    Emotionally Abused: No     Physically Abused: No    Sexually Abused: No    Outpatient Medications Prior to Visit  Medication Sig Dispense Refill   aspirin EC 81 MG tablet Take 81 mg by mouth daily.     atorvastatin (LIPITOR) 40 MG tablet TAKE 1 TABLET(40 MG) BY MOUTH DAILY 90 tablet 1   cetirizine (ZYRTEC) 10 MG tablet TAKE 1 TABLET DAILY 90 tablet 3   clonazePAM (KLONOPIN) 1 MG tablet Take 1 tablet (1 mg total) by mouth 2 (two) times daily as needed. 180 tablet 1   escitalopram (LEXAPRO) 20 MG tablet TAKE 1 TABLET DAILY 90 tablet 1   mirabegron ER (MYRBETRIQ) 50 MG TB24 tablet TAKE 1 TABLET DAILY 90 tablet 1   Probiotic Product (PROBIOTIC DAILY PO) Take by mouth.     valACYclovir (VALTREX) 500 MG tablet TAKE 1 TABLET DAILY 90 tablet 3   azelastine (ASTELIN) 0.1 % nasal spray Place 2 sprays into both nostrils 2 (two) times daily. Use in each nostril as directed (Patient not taking: Reported on 08/04/2022) 301 mL 1   fluticasone (FLONASE) 50 MCG/ACT nasal spray Place 2 sprays into both nostrils daily. (Patient not taking: Reported on 08/04/2022) 16 g 6   HYDROcodone-acetaminophen (NORCO/VICODIN) 5-325 MG tablet Take 1 tablet by mouth every 4 (four) hours as needed for moderate pain. (Patient not taking: Reported on 08/04/2022) 30 tablet 0   No facility-administered medications prior to visit.    Allergies  Allergen Reactions   Nickel Other (See Comments)    Redness and pain   Oxycodone-Acetaminophen Itching    Review of Systems  Constitutional:  Negative for fever and malaise/fatigue.  HENT:  Negative for congestion.   Eyes:  Negative for blurred vision.  Respiratory:  Negative for shortness of breath.   Cardiovascular:  Negative for chest pain, palpitations and leg swelling.  Gastrointestinal:  Negative for abdominal pain, blood in stool and nausea.  Genitourinary:  Negative for dysuria and frequency.  Musculoskeletal:  Positive for back pain. Negative for falls.  Skin:  Negative for rash.  Neurological:   Negative for dizziness, loss of consciousness and headaches.  Endo/Heme/Allergies:  Negative for environmental allergies.  Psychiatric/Behavioral:  Negative for depression. The patient is not nervous/anxious.        Objective:    Physical Exam Nursing note reviewed. Exam conducted with a chaperone present.  Constitutional:      General: She is not in acute distress.    Appearance: Normal appearance. She  is not ill-appearing.  HENT:     Head: Normocephalic and atraumatic.     Right Ear: External ear normal.     Left Ear: External ear normal.  Eyes:     Extraocular Movements: Extraocular movements intact.     Pupils: Pupils are equal, round, and reactive to light.  Cardiovascular:     Rate and Rhythm: Normal rate and regular rhythm.     Heart sounds: Normal heart sounds. No murmur heard.    No gallop.  Pulmonary:     Effort: Pulmonary effort is normal. No respiratory distress.     Breath sounds: Normal breath sounds. No wheezing or rales.  Musculoskeletal:        General: Tenderness present.     Lumbar back: Spasms, tenderness and bony tenderness present. Decreased range of motion.  Skin:    General: Skin is warm and dry.  Neurological:     Mental Status: She is alert and oriented to person, place, and time.  Psychiatric:        Judgment: Judgment normal.     BP (!) 140/78 (BP Location: Right Arm, Patient Position: Sitting, Cuff Size: Normal)   Pulse 68   Temp 98.1 F (36.7 C) (Oral)   Resp 18   Ht 5' (1.524 m)   Wt 151 lb (68.5 kg)   SpO2 98%   BMI 29.49 kg/m  Wt Readings from Last 3 Encounters:  08/04/22 151 lb (68.5 kg)  04/07/22 146 lb 9.6 oz (66.5 kg)  01/11/22 141 lb 9.6 oz (64.2 kg)    Diabetic Foot Exam - Simple   No data filed    Lab Results  Component Value Date   WBC 7.5 08/24/2017   HGB 15.1 (H) 08/24/2017   HCT 45.1 08/24/2017   PLT 268.0 08/24/2017   GLUCOSE 98 04/06/2021   CHOL 157 04/06/2021   TRIG 155.0 (H) 04/06/2021   HDL 51.80  04/06/2021   LDLDIRECT 73.0 04/21/2020   LDLCALC 74 04/06/2021   ALT 18 04/06/2021   AST 17 04/06/2021   NA 135 04/06/2021   K 4.6 04/06/2021   CL 101 04/06/2021   CREATININE 0.65 04/06/2021   BUN 19 04/06/2021   CO2 28 04/06/2021   TSH 1.43 08/24/2017   HGBA1C 5.9 04/06/2021    Lab Results  Component Value Date   TSH 1.43 08/24/2017   Lab Results  Component Value Date   WBC 7.5 08/24/2017   HGB 15.1 (H) 08/24/2017   HCT 45.1 08/24/2017   MCV 91.3 08/24/2017   PLT 268.0 08/24/2017   Lab Results  Component Value Date   NA 135 04/06/2021   K 4.6 04/06/2021   CO2 28 04/06/2021   GLUCOSE 98 04/06/2021   BUN 19 04/06/2021   CREATININE 0.65 04/06/2021   BILITOT 0.7 04/06/2021   ALKPHOS 74 04/06/2021   AST 17 04/06/2021   ALT 18 04/06/2021   PROT 7.1 04/06/2021   ALBUMIN 4.4 04/06/2021   CALCIUM 9.4 04/06/2021   GFR 89.38 04/06/2021   Lab Results  Component Value Date   CHOL 157 04/06/2021   Lab Results  Component Value Date   HDL 51.80 04/06/2021   Lab Results  Component Value Date   LDLCALC 74 04/06/2021   Lab Results  Component Value Date   TRIG 155.0 (H) 04/06/2021   Lab Results  Component Value Date   CHOLHDL 3 04/06/2021   Lab Results  Component Value Date   HGBA1C 5.9 04/06/2021  Assessment & Plan:   Problem List Items Addressed This Visit       Unprioritized   Spinal stenosis of lumbar region - Primary   Relevant Medications   HYDROcodone-acetaminophen (NORCO/VICODIN) 5-325 MG tablet   Other Relevant Orders   Ambulatory referral to Pain Clinic   Other Visit Diagnoses     Primary osteoarthritis of right hip       Relevant Medications   HYDROcodone-acetaminophen (NORCO/VICODIN) 5-325 MG tablet   Other Relevant Orders   Ambulatory referral to Pain Clinic   High risk medication use       Relevant Orders   Drug Monitoring Panel [phone] , Urine   Need for influenza vaccination       Relevant Orders   Flu Vaccine QUAD High  Dose(Fluad) (Completed)        Meds ordered this encounter  Medications   HYDROcodone-acetaminophen (NORCO/VICODIN) 5-325 MG tablet    Sig: Take 1 tablet by mouth every 4 (four) hours as needed for moderate pain.    Dispense:  20 tablet    Refill:  0    I, [name], DO, personally preformed the services described in this documentation.  All medical record entries made by the scribe were at my direction and in my presence.  I have reviewed the chart and discharge instructions (if applicable) and agree that the record reflects my personal performance and is accurate and complete. 08/04/2022   I,[name],acting as a scribe for [name], DO.,have documented all relevant documentation on the behalf of [name], DO,as directed by  [name], DO while in the presence of [name], DO.   [name], DO

## 2022-08-05 DIAGNOSIS — H52223 Regular astigmatism, bilateral: Secondary | ICD-10-CM | POA: Diagnosis not present

## 2022-08-05 DIAGNOSIS — H04123 Dry eye syndrome of bilateral lacrimal glands: Secondary | ICD-10-CM | POA: Diagnosis not present

## 2022-08-05 DIAGNOSIS — H5213 Myopia, bilateral: Secondary | ICD-10-CM | POA: Diagnosis not present

## 2022-08-05 DIAGNOSIS — H1045 Other chronic allergic conjunctivitis: Secondary | ICD-10-CM | POA: Diagnosis not present

## 2022-08-05 DIAGNOSIS — H524 Presbyopia: Secondary | ICD-10-CM | POA: Diagnosis not present

## 2022-08-05 DIAGNOSIS — H25813 Combined forms of age-related cataract, bilateral: Secondary | ICD-10-CM | POA: Diagnosis not present

## 2022-08-06 LAB — DRUG MONITORING PANEL 376104, URINE
Amphetamines: NEGATIVE ng/mL (ref <500)
Barbiturates: NEGATIVE ng/mL (ref <300)
Benzodiazepines: NEGATIVE ng/mL (ref <100)
Cocaine Metabolite: NEGATIVE ng/mL (ref <150)
Desmethyltramadol: NEGATIVE ng/mL (ref <100)
Opiates: NEGATIVE ng/mL (ref <100)
Oxycodone: NEGATIVE ng/mL (ref <100)
Tramadol: NEGATIVE ng/mL (ref <100)

## 2022-08-06 LAB — DM TEMPLATE

## 2022-08-08 DIAGNOSIS — M5416 Radiculopathy, lumbar region: Secondary | ICD-10-CM | POA: Diagnosis not present

## 2022-08-18 DIAGNOSIS — M1611 Unilateral primary osteoarthritis, right hip: Secondary | ICD-10-CM | POA: Diagnosis not present

## 2022-08-19 ENCOUNTER — Encounter: Payer: Self-pay | Admitting: Physical Medicine and Rehabilitation

## 2022-09-05 ENCOUNTER — Encounter: Payer: Self-pay | Admitting: Physical Medicine and Rehabilitation

## 2022-09-05 ENCOUNTER — Encounter
Payer: Medicare Other | Attending: Physical Medicine and Rehabilitation | Admitting: Physical Medicine and Rehabilitation

## 2022-09-05 VITALS — BP 152/82 | HR 63 | Ht 60.0 in | Wt 151.0 lb

## 2022-09-05 DIAGNOSIS — M5431 Sciatica, right side: Secondary | ICD-10-CM | POA: Insufficient documentation

## 2022-09-05 DIAGNOSIS — M25551 Pain in right hip: Secondary | ICD-10-CM | POA: Insufficient documentation

## 2022-09-05 DIAGNOSIS — M5432 Sciatica, left side: Secondary | ICD-10-CM | POA: Insufficient documentation

## 2022-09-05 DIAGNOSIS — M6289 Other specified disorders of muscle: Secondary | ICD-10-CM | POA: Diagnosis not present

## 2022-09-05 DIAGNOSIS — M1611 Unilateral primary osteoarthritis, right hip: Secondary | ICD-10-CM | POA: Insufficient documentation

## 2022-09-05 DIAGNOSIS — G8929 Other chronic pain: Secondary | ICD-10-CM | POA: Insufficient documentation

## 2022-09-05 MED ORDER — CELECOXIB 100 MG PO CAPS: 200.0000 mg | ORAL_CAPSULE | Freq: Every day | ORAL | 2 refills | Status: DC

## 2022-09-05 MED ORDER — GABAPENTIN 300 MG PO CAPS: 300.0000 mg | ORAL_CAPSULE | Freq: Every day | ORAL | 2 refills | Status: DC

## 2022-09-05 NOTE — Assessment & Plan Note (Addendum)
Per imaging, likely etiology neurogenic claudication due to multilevel spinal stenosis L3/4-L5/S1 + L3/4 & L5/S1 Spondylolisthesis  Pending L3-S1 decompression / fusion with Dr. Georganna Skeans 02/2023.  IF cleared by Dr. Georganna Skeans, advised patient she can consider resuming chiropractic care, given she is no longer in PT and has had good benefit from this in the past.   Start Gabapentin 300 mg QHS to assist in neuropathic pain and sleep. Can increase to BID if tolerating well next visit; she will contact office in 1-2 weeks to review medication effect.

## 2022-09-05 NOTE — Assessment & Plan Note (Signed)
Likely due to longstanting immobility, neurogenic claudication.   Continue HEP per PT; also provided home hamstring stretch exercises to be done 1-2x daily to prevent further ROM restriction.

## 2022-09-05 NOTE — Patient Instructions (Signed)
Today, you signed a pain contract with our clinic to prescribe narcotics, although none were prescribed today.  You were started on Celecoxib 100 mg tablets 2 tabs daily. While on this medication, DO NOT take other NSAIDs like Advil/ibuprofen. If the medication does not last through the day, you can instead take 1 tablet every 12 hours. DO NOT take more medication than 2 tablets daily.  You were also started on gabapentin 300 mg nightly to help with pain and sleep.  In 1 week, you will call the clinic or contact me through [facility], and let me know how you are doing on these medications. If you are not having adequate pain releif at that time, we can discuss resuming narcotic medication at a low dose.   Please continue home exercises as prescribed by PT. You have also been given hamstring stretches, which you should do at least once daily. Heat packs on the backs of your legs can also be helpful for leg tightness.  If allowed by your neurosurgeon, consider resuming chiropractic care, as this has been helpful for you in the past.  See me in 1-2 months to follow up on this plan and make adjustments if needed.

## 2022-09-05 NOTE — Assessment & Plan Note (Signed)
Pending THR with Dr. Kalman Shan 11/2022.  Endorses no benefit from Norco 5 mg, Tylenol; most benefit from Ibuprofen, which she is using excessively despite cardiac Hx.  Prescribed Celecoxib 100 mg 2 tablets daily, and advised patient to discontinue ibuprofen/OTC NSAIDs.   She will call in 1-2 weeks to review effect; if needed at that time, may trial low-dose narcotic such as Percocet 5 mg or Tramadol 50 mg BID PRN for adjunctive pain control.   PDMP reviewed, pain contract signed today in anticipation of discussion as above.   Follow up in 2-3 months

## 2022-09-05 NOTE — Progress Notes (Signed)
Subjective:    Patient ID: Erin Acosta, female    DOB: 1951-08-22, 71 y.o.   MRN: [id number]  HPI  Erin Acosta is a 71 y.o. year old female  who  has a past medical history of Allergy, Anxiety, Depression, Genital herpes, H/O cesarean section, Hypertension, Rotator cuff tear, Scoliosis, and Osteopenia (dexa T -2.3 11/02/21).   They are presenting to PM&R clinic as a new patient for pain management evaluation. They were referred by Dr. Roma Schanz for treatment of back and hip pain. Per her last note:  "She has an upcomming right hip replacement procedure in January 2024 at [facility]. After she recovers from her hip procedure she is planning on a back procedure to help manage her back pain. She is currently taking tylenol to manage her symptoms and finds no relief. She has tried physical therapy in the past and found mild relief. She has more pain in the back of her legs compared to her lower back. She has seen a pain management specialist but found they could not prescribe medication to help manage her specific pain. She is requesting medication to help manage her pain until her hip procedure in January, 2024." Dr. Carollee Herter prescribed Hydrocodone-Acetaminophen 5-325 mg #20 tabs on 08/04/22. UDS at that time pan-negative.   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  Duration of pain: Constant, but with waxing/waning severity Description of pain: aching pain that sometimes goes into sharp stabbing Severity: On average 6-7/10. At worst 10/10. At best 4/10. Exacerbating factors: First thing in the morning, standing up straight or sitting for too long. Physical activity bending forward/gardening on her knees.  Remitting factors: Getting up to walk or changing positions. Leg exercises will relax her legs.  Red  flag symptoms: Patient denies saddle anesthesia, loss of bowel or bladder continence, new weakness, new numbness/tingling, or pain waking up at nighttime.  Medications tried: Topical medications ( good effect) : Tried Biofreeze only, in the evening before bed in the back of her legs, which is helpful. Nsaids ( good effect): She finds Advil dual "works best", but is "hard on the kidneys long term". She takes 3 at a time anywhere from 2-3x daily. She does also have Hx of cardiac stent, and was told to avoid NSAIDs.   Tylenol  (no effect): "It's nothing but tic tacs" Opiates  (no effect): She's been getting intermittent Norco 5 mg tabs from her PCP; she states "they don't work." She has subsequently stopped taking them after 1 week.  Gabapentin / Lyrica  ( ? effect): Was on Lyrica 75 mg tabs BID in 2022; doesn't remember this, only filled one time. Never tried gabapentin as far as she is aware.  TCAs: Not tried SNRIs: Not tried; currently on Lexapro 20 mg daily. Mood is stable.  Other  ( no effect): CBD gummies do not help. Muscle relaxer tried, didn't do anything.   Other treatments: PT/OT  ( some effect): Recently went through 8 weeks of PT for her back, finished 1 month ago. Was helpful for her back but it agrivated her hip pain. Still does HEP and "I work in my yard a lot". 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  TENs unit ( good effect): Tried with PT/chiropractor, which was helpful tempporarily.  Injections ( good but temporary effect): Had 2x ESI, 1st provided 100% relief for 1 week, and 2nd was extremely painful for her with mild pain relief for 3 days.  Surgery: Awaiting R hip replacement in January with Dr. Kalman Shan at [facility], and back surgery (likely in April; Dr. Sabra Heck "he's going to  shave the vertebrae and put some screws along the spine to eliminate the pain in my legs") .  Other  ( mild effect): Used heat packs with chiropractor, provides very temporary relief.  Goals for pain control: Improved tolerance for activities she enjoys, like yardwork/gardening.    Pain Inventory Average Pain 6 Pain Right Now 6 My pain is intermittent, constant, sharp, burning, dull, stabbing, tingling, and aching  In the last 24 hours, has pain interfered with the following? General activity 0 Relation with others 0 Enjoyment of life 0 What TIME of day is your pain at its worst? morning , daytime, evening, and night Sleep (in general) Fair  Pain is worse with: standing Pain improves with: medication and walking Relief from Meds: 8  ability to climb steps?  yes do you drive?  yes Do you have any goals in this area?  yes  retired Do you have any goals in this area?  yes  trouble walking anxiety  Any changes since last visit?  yes at [facility]  Any changes since last visit?  no    Family History  Problem Relation Age of Onset   Arthritis Other    Hyperlipidemia Other    Heart disease Other    Stroke Other    Hypertension Other    Social History   Socioeconomic History   Marital status: Widowed    Spouse name: Not on file   Number of children: Not on file   Years of education: Not on file   Highest education level: Not on file  Occupational History   Not on file  Tobacco Use   Smoking status: Never   Smokeless tobacco: Never  Substance and Sexual Activity   Alcohol use: Yes    Alcohol/week: 1.0 standard drink of alcohol    Types: 1 Standard drinks or equivalent per week    Comment: Wine in the evening   Drug use: No   Sexual activity: Not on file  Other Topics Concern   Not on file  Social History Narrative   Not on file   Social Determinants of Health   Financial Resource Strain: Low Risk  (10/08/2021)   Overall Financial Resource Strain (CARDIA)     Difficulty of Paying Living Expenses: Not hard at all  Food Insecurity: No Food Insecurity (10/08/2021)   Hunger Vital Sign    Worried About Running Out of Food in the Last Year: Never true    Ran Out of Food in the Last Year: Never true  Transportation Needs: No Transportation Needs (10/08/2021)   PRAPARE - Hydrologist (Medical): No    Lack of Transportation (Non-Medical): No  Physical Activity: Inactive (10/08/2021)   Exercise Vital Sign    Days of Exercise per Week: 0 days    Minutes of Exercise per Session: 0 min  Stress: No Stress Concern Present (10/08/2021)   [place]    Feeling of Stress : Only a little  Social  Connections: Socially Isolated (10/08/2021)   Social Connection and Isolation Panel [NHANES]    Frequency of Communication with Friends and Family: More than three times a week    Frequency of Social Gatherings with Friends and Family: More than three times a week    Attends Religious Services: Never    Marine scientist or Organizations: No    Attends Archivist Meetings: Never    Marital Status: Widowed   Past Surgical History:  Procedure Laterality Date   BILATERAL CARPAL TUNNEL RELEASE     BLEPHAROPLASTY     BREAST BIOPSY     unsure which breast   CESAREAN SECTION     FOOT SURGERY Right    mini face lift     TOTAL SHOULDER ARTHROPLASTY Right    [facility]   TRIGGER FINGER RELEASE     tummy tuck     Past Medical History:  Diagnosis Date   Allergy    Anxiety    Depression    Genital herpes    H/O cesarean section    Hypertension    Rotator cuff tear    Scoliosis    There were no vitals taken for this visit.  Opioid Risk Score:   Fall Risk Score:  `1  Depression screen PHQ 2/9     10/08/2021    2:40 PM 04/06/2021    5:53 PM 05/07/2020    8:08 AM 04/21/2020   10:26 AM 10/29/2019   11:31 AM 06/14/2019    1:51 PM 05/10/2018    2:01 PM   Depression screen PHQ 2/9  Decreased Interest 0 0 0 1 0 2 0  Down, Depressed, Hopeless 0 1 0 0 0 2 1  PHQ - 2 Score 0 1 0 1 0 4 1  Altered sleeping  2    2   Tired, decreased energy  2    2   Change in appetite  1    3   Feeling bad or failure about yourself   1       Trouble concentrating  0    1   Moving slowly or fidgety/restless  0    1   Suicidal thoughts  0    1   PHQ-9 Score  7    14   Difficult doing work/chores      Somewhat difficult     Review of Systems  Genitourinary:  Positive for urgency.  Musculoskeletal:  Positive for gait problem.  All other systems reviewed and are negative.      Objective:   Physical Exam  Constitutional: No apparent distress. Appropriate appearance for age.  HENT: No JVD. Neck Supple. Trachea midline. Atraumatic, normocephalic. Eyes: PERRLA. EOMI. Visual fields grossly intact.  Cardiovascular: RRR, no murmurs/rub/gallops. No Edema. Peripheral pulses 2+  Respiratory: CTAB. No rales, rhonchi, or wheezing. On RA.  Abdomen: + bowel sounds, normoactive. No distention or tenderness.  Skin: C/D/I. No apparent lesions. MSK:      No apparent deformity.      Strength:                RUE: 5/5 SA, 5/5 EF, 5/5 EE, 5/5 WE, 5/5 FF, 5/5 FA                 LUE: 5/5 SA, 5/5 EF, 5/5 EE, 5/5 WE, 5/5 FF, 5/5 FA                 RLE: 5/5 HF, 5/5 KE,  5/5 DF, 5/5 EHL, 5/5 PF                 LLE:  5/5 HF, 5/5 KE, 5/5 DF, 5/5 EHL, 5/5 PF   Neurologic exam:  Cognition: AAO to person, place, time and event.  Mood: Pleasant affect, appropriate mood.  Sensation: To light touch intact in BL UEs and LEs  Reflexes: 2+ in BL UE and LEs. Negative Hoffman's and babinski signs bilaterally. CN: 2-12 grossly intact.  Coordination: No apparent tremors. No ataxia.  Spasticity: MAS 0 in all extremities.  Gait: Mild forward lean, otherwise normal, good stance and stride. No trendelenberg.   Back Exam:   Inspection: Pelvis was even.  Lumbar lordotic curvature was mildly  reduced.   Palpation: Palpatory exam revealed ttp at the R lumbar paraspinals, R PSIS, R SI joint and R TFL . There was no evidence of spasm.  ROM:      Back: Flexion WNL,  Extension mildly reduced     Legs: R>>L knee extension (-10 deg on R, -5 on L deg), R hip internal rotation Special/provocative testing:    SLR: + hamstring tightness/pain bilaterally; no shooting neuropathic pains   Slump test: negaitve   Facet loading: + R radiating pains down posterior leg   TTP at paraspinals: + R lumbosacral spine   Corky Sox test: + R for lateral hip pain and tightness; no groin pain or SI joint pain   FAIR test: negative   Thomas Test: ++ for R hamstring tightness      Assessment & Plan:  Erin Acosta is a 71 y.o. year old female  who  has a past medical history of Allergy, Anxiety, Depression, Genital herpes, H/O cesarean section, Hypertension, Rotator cuff tear, Scoliosis, and Osteopenia (dexa T -2.3 11/02/21).   They are presenting to PM&R clinic as a new patient for pain management evaluation. They were referred by Dr. Roma Schanz for treatment of back and hip pain. Of note, she is pending R THR 11/2022 with Dr. Kalman Shan at [facility], followed by L3-S1 decompression / fusion with Dr. Georganna Skeans 02/2023.  Chronic right hip pain Assessment & Plan: Pending THR with Dr. Kalman Shan 11/2022.  Endorses no benefit from Norco 5 mg, Tylenol; most benefit from Ibuprofen, which she is using excessively despite cardiac Hx.  Prescribed Celecoxib 100 mg 2 tablets daily, and advised patient to discontinue ibuprofen/OTC NSAIDs.   She will call in 1-2 weeks to review effect; if needed at that time, may trial low-dose narcotic such as Percocet 5 mg or Tramadol 50 mg BID PRN for adjunctive pain control.   PDMP reviewed, pain contract signed today in anticipation of discussion as above.   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 Georganna Skeans 02/2023.  IF cleared by Dr. Georganna Skeans, advised patient she can consider resuming chiropractic care, given she is no longer in PT and has had good benefit from this in the past.   Start Gabapentin 300 mg QHS to assist in neuropathic pain and sleep. Can increase to BID if tolerating well next visit; she will contact office in 1-2 weeks to review medication effect.      Hamstring tightness of right lower extremity Assessment & Plan: Likely due to longstanting immobility, neurogenic claudication.   Continue HEP per PT; also provided home hamstring stretch  exercises to be done 1-2x daily to prevent further ROM restriction.   Osteoarthritis of right hip, unspecified osteoarthritis type  Other orders -     Celecoxib; Take 2 capsules (200 mg total) by mouth daily.  Dispense: 60 capsule; Refill: 2 -     Gabapentin; Take 1 capsule (300 mg total) by mouth at bedtime.  Dispense: 30 capsule; Refill: [place], DO 09/05/2022

## 2022-09-12 DIAGNOSIS — I351 Nonrheumatic aortic (valve) insufficiency: Secondary | ICD-10-CM | POA: Diagnosis not present

## 2022-09-12 DIAGNOSIS — I2510 Atherosclerotic heart disease of native coronary artery without angina pectoris: Secondary | ICD-10-CM | POA: Diagnosis not present

## 2022-10-03 DIAGNOSIS — M48062 Spinal stenosis, lumbar region with neurogenic claudication: Secondary | ICD-10-CM | POA: Diagnosis not present

## 2022-10-05 ENCOUNTER — Encounter
Payer: Medicare Other | Attending: Physical Medicine and Rehabilitation | Admitting: Physical Medicine and Rehabilitation

## 2022-10-05 ENCOUNTER — Encounter: Payer: Self-pay | Admitting: Physical Medicine and Rehabilitation

## 2022-10-05 VITALS — BP 154/82 | HR 70 | Ht 60.0 in | Wt 151.8 lb

## 2022-10-05 DIAGNOSIS — M6289 Other specified disorders of muscle: Secondary | ICD-10-CM | POA: Diagnosis not present

## 2022-10-05 DIAGNOSIS — G8929 Other chronic pain: Secondary | ICD-10-CM | POA: Diagnosis not present

## 2022-10-05 DIAGNOSIS — M5431 Sciatica, right side: Secondary | ICD-10-CM | POA: Insufficient documentation

## 2022-10-05 DIAGNOSIS — M5432 Sciatica, left side: Secondary | ICD-10-CM | POA: Diagnosis not present

## 2022-10-05 DIAGNOSIS — M25551 Pain in right hip: Secondary | ICD-10-CM | POA: Insufficient documentation

## 2022-10-05 MED ORDER — GABAPENTIN 600 MG PO TABS: ORAL_TABLET | ORAL | 2 refills | Status: DC

## 2022-10-05 MED ORDER — TRAMADOL HCL 50 MG PO TABS: 50.0000 mg | ORAL_TABLET | Freq: Two times a day (BID) | ORAL | 0 refills | Status: DC | PRN

## 2022-10-05 NOTE — Assessment & Plan Note (Signed)
Pending R THR 11/2022 with Dr. Renaee Munda at [facility]  Prescribed #30 tabs of Tramadol 50 mg, which you can use up to twice per day. Follow up in 1 month to review effect of medication and obtain further scripts; please bring your pills to that appointment. We will plan to wean this after your surgeries.   Call me if any concerns. Please stay under the daily dose limit of Advil. Please stop Celebrex.   Indication for chronic opioid: M25.5, R hip pain Medication and dose: Tramadol 50 mg BID PRN # pills per month: 30 Last UDS date: 09/05/22 Opioid Treatment Agreement signed (Y/N): Y Opioid Treatment Agreement last reviewed with patient:   09/05/22 NCCSRS/PDMP reviewed this encounter (include red flags): Yes   Management will include: Low Risk (<10 MME) UDS every 6-12 months NCCSR check every visit Follow up Q3M initially, Q6M once established

## 2022-10-05 NOTE — Assessment & Plan Note (Signed)
Failed PT, HEP w/ stretching Given + symptoms with SLR, suspect partially due to spinal stenosis Neurosurgery pending in April

## 2022-10-05 NOTE — Patient Instructions (Addendum)
I have started you on gabapentin for nerve pain. You will take a half a tab at night to start, and can increase to half a tab three times per day after 1-2 weeks.  I have also given you #30 tabs of Tramadol 50 mg, which you can use up to twice per day. Follow up in 1 month to review effect of medication and obtain further scripts; please bring your pills to that appointment. We will plan to wean this after your surgeries.   Call me if any concerns. Please stay under the daily dose limit of Advil. Please stop Celebrex.

## 2022-10-05 NOTE — Progress Notes (Signed)
Subjective:    Patient ID: Erin Acosta, female    DOB: [birth year], 71 y.o.   MRN: [id number]  HPI Erin Acosta is a 71 y.o. year old female  who  has a past medical history of Allergy, Anxiety, Depression, Genital herpes, H/O cesarean section, Hypertension, Rotator cuff tear, Scoliosis, and Osteopenia (dexa T -2.3 11/02/21) presenting to PM&R clinic for follow up of back and R hip pain.   Of note, she is pending R THR 11/2022 with Dr. Kalman Shan at [facility], followed by L3-S1 decompression / fusion with Dr. Georganna Skeans 02/2023.   Chronic right hip pain Assessment & Plan: Pending THR with Dr. Kalman Shan 11/2022.   Endorses no benefit from Norco 5 mg, Tylenol; most benefit from Ibuprofen, which she is using excessively despite cardiac Hx.   Prescribed Celecoxib 100 mg 2 tablets daily, and advised patient to discontinue ibuprofen/OTC NSAIDs.    She will call in 1-2 weeks to review effect; if needed at that time, may trial low-dose narcotic such as Percocet 5 mg or Tramadol 50 mg BID PRN for adjunctive pain control.    PDMP reviewed, pain contract signed today in anticipation of discussion as above.    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 Georganna Skeans 02/2023.   IF cleared by Dr. Georganna Skeans, advised patient she can consider resuming chiropractic care, given she is no longer in PT and has had good benefit from this in the past.    Start Gabapentin 300 mg QHS to assist in neuropathic pain and sleep. Can increase to BID if tolerating well next visit; she will contact office in 1-2 weeks to review medication effect.          Hamstring tightness of right lower extremity Assessment & Plan: Likely due to longstanting immobility, neurogenic claudication.    Continue HEP per PT; also  provided home hamstring stretch exercises to be done 1-2x daily to prevent further ROM restriction.    Interval Hx: - R THR planned in January with Dr. Dione Housekeeper with [facility] - Neurosurgery Dr. Georganna Skeans re-evaluating in March, anticipating L3-S1 Decompression / Fusion in April - Patient states the pharmacy did not get the gabapentin prescription; she did get the celebrex, which has been "great tic-tacs". She takes 2 at night instead of Advil sometimes, but stopped it because she felt it didn't do anything. She continues with excessive use of ibuprofen as this gives her the best pain control.   - She states she once had Hydrocodone via Dr. Etter Sjogren at 2.5 mg dose, and this got her comfortable enough to be able to do physical activity. She states she had it again more recently, and felt she had no benefit from it, even with the 5 mg tabs. She's tried Percocet in the past and "it did not go well".    - Back of her right leg is the worst pain; right hip is second, then back. She describes it as an aching pain with some intermittent stabbing in her right posterior leg.   - She states she used muscle relaxants in the past and did not perceive a benefit.   - Failed 8 weeks of PT in the past for her back and hip  Pain Inventory Average Pain 8 Pain Right Now 8 My pain is sharp, dull, and  aching  In the last 24 hours, has pain interfered with the following? General activity 7 Relation with others 8 Enjoyment of life 8 What TIME of day is your pain at its worst? morning  Sleep (in general) Fair  Pain is worse with: standing Pain improves with: pacing activities Relief from Meds: 5    Family History  Problem Relation Age of Onset   Arthritis Other    Hyperlipidemia Other    Heart disease Other    Stroke Other    Hypertension Other    Social History   Socioeconomic History   Marital status: Widowed    Spouse name: Not on file   Number of children: Not on file   Years of education: Not  on file   Highest education level: Not on file  Occupational History   Not on file  Tobacco Use   Smoking status: Never   Smokeless tobacco: Never  Vaping Use   Vaping Use: Never used  Substance and Sexual Activity   Alcohol use: Yes    Alcohol/week: 1.0 standard drink of alcohol    Types: 1 Standard drinks or equivalent per week    Comment: Wine in the evening   Drug use: No   Sexual activity: Not on file  Other Topics Concern   Not on file  Social History Narrative   Not on file   Social Determinants of Health   Financial Resource Strain: Low Risk  (10/08/2021)   Overall Financial Resource Strain (CARDIA)    Difficulty of Paying Living Expenses: Not hard at all  Food Insecurity: No Food Insecurity (10/08/2021)   Hunger Vital Sign    Worried About Running Out of Food in the Last Year: Never true    Ran Out of Food in the Last Year: Never true  Transportation Needs: No Transportation Needs (10/08/2021)   PRAPARE - Administrator, Civil Service (Medical): No    Lack of Transportation (Non-Medical): No  Physical Activity: Inactive (10/08/2021)   Exercise Vital Sign    Days of Exercise per Week: 0 days    Minutes of Exercise per Session: 0 min  Stress: No Stress Concern Present (10/08/2021)   [employer] of Occupational Health - Occupational Stress Questionnaire    Feeling of Stress : Only a little  Social Connections: Socially Isolated (10/08/2021)   Social Connection and Isolation Panel [NHANES]    Frequency of Communication with Friends and Family: More than three times a week    Frequency of Social Gatherings with Friends and Family: More than three times a week    Attends Religious Services: Never    Database administrator or Organizations: No    Attends Banker Meetings: Never    Marital Status: Widowed   Past Surgical History:  Procedure Laterality Date   BILATERAL CARPAL TUNNEL RELEASE     BLEPHAROPLASTY     BREAST BIOPSY      unsure which breast   CESAREAN SECTION     FOOT SURGERY Right    mini face lift     TOTAL SHOULDER ARTHROPLASTY Right    [facility]   TRIGGER FINGER RELEASE     tummy tuck     Past Surgical History:  Procedure Laterality Date   BILATERAL CARPAL TUNNEL RELEASE     BLEPHAROPLASTY     BREAST BIOPSY     unsure which breast   CESAREAN SECTION     FOOT SURGERY Right    mini  face lift     TOTAL SHOULDER ARTHROPLASTY Right    [facility]   TRIGGER FINGER RELEASE     tummy tuck     Past Medical History:  Diagnosis Date   Allergy    Anxiety    Depression    Genital herpes    H/O cesarean section    Hypertension    Rotator cuff tear    Scoliosis    BP (!) 154/82   Pulse 70   Ht 5' (1.524 m)   Wt 151 lb 12.8 oz (68.9 kg)   SpO2 96%   BMI 29.65 kg/m   Opioid Risk Score:   Fall Risk Score:  `1  Depression screen [facility] 2/9     10/05/2022   11:28 AM 09/05/2022   10:45 AM 10/08/2021    2:40 PM 04/06/2021    5:53 PM 05/07/2020    8:08 AM 04/21/2020   10:26 AM 10/29/2019   11:31 AM  Depression screen PHQ 2/9  Decreased Interest 0 0 0 0 0 1 0  Down, Depressed, Hopeless 0 0 0 1 0 0 0  PHQ - 2 Score 0 0 0 1 0 1 0  Altered sleeping  1  2     Tired, decreased energy  1  2     Change in appetite  0  1     Feeling bad or failure about yourself   0  1     Trouble concentrating  0  0     Moving slowly or fidgety/restless  0  0     Suicidal thoughts  0  0     PHQ-9 Score  2  7       Review of Systems  Constitutional: Negative.   HENT: Negative.    Eyes: Negative.   Respiratory: Negative.    Cardiovascular: Negative.   Gastrointestinal: Negative.   Endocrine: Negative.   Genitourinary: Negative.   Musculoskeletal:  Positive for arthralgias and back pain.       Right hip down legs bilateral  Skin: Negative.   Allergic/Immunologic: Negative.   Neurological: Negative.   Hematological: Negative.   Psychiatric/Behavioral: Negative.    All other systems reviewed and are  negative.      Objective:   Physical Exam  Constitution: Appropriate appearance for age. No apparnet distress HEENT: PERRL, EOMI grossly intact.  Resp: CTAB. No rales, rhonchi, or wheezing. Cardio: RRR. No mumurs, rubs, or gallops. No peripheral edema. Abdomen: Nondistended. Nontender. +bowel sounds. Psych: Appropriate mood and affect. Neuro: AAOx4. No apparent deficits. Sensation to light touch intact in bilateral lower extremities.   Back Exam:   Inspection: Pelvis was even .  Lumbar lordotic curvature was WNL .  There was no evidence of severe scoliosis.  Palpation: Palpatory exam revealed ttp at the R>L lumbar paraspinals, R PSIS . There was no evidence of spasm. No trigger points were noted.    ROM:  Flexion WNL (110-120*),  Extension limited by pain (10-15*) ROM revealed restricted ROM in back extension, right hip flexion.  Strength: 5/5 in bilateral hips, knees, and ankles Special/provocative testing:    SLR: + R sided posterior leg pain   Slump test: negative   Facet loading: + low back pain R>L   TTP at paraspinals: Mild, bilateral     Assessment & Plan:  Erin Acosta is a 71 y.o. year old female  who  has a past medical history of Allergy, Anxiety, Depression, Genital herpes, H/O cesarean section,  Hypertension, Rotator cuff tear, Scoliosis, and Osteopenia (dexa T -2.3 11/02/21) presenting to PM&R clinic for follow up of back and R hip pain.   Of note, she is pending R THR 11/2022 with Dr. Kalman Shan at [facility], followed by L3-S1 decompression / fusion with Dr. Georganna Skeans 02/2023.    Bilateral sciatica Assessment & Plan: Prescribed gabapentin 600 mg tabs for nerve pain (pharmacy did not fill 300 mg tabs last time; 600 mg tab preferred per insurance). You will take a half a tab at night to start, and can increase to half a tab three times per day after 1-2 weeks.  Follow up in 1 month   Chronic right hip pain Assessment & Plan: Pending R THR 11/2022 with Dr.  Kalman Shan at [facility] tabs of Tramadol 50 mg, which you can use up to twice per day. Follow up in 1 month to review effect of medication and obtain further scripts; please bring your pills to that appointment. We will plan to wean this after your surgeries.   Call me if any concerns. Please stay under the daily dose limit of Advil. Please stop Celebrex.   Indication for chronic opioid: M25.5, R hip pain Medication and dose: Tramadol 50 mg BID PRN # pills per month: 30 Last UDS date: 09/05/22 Opioid Treatment Agreement signed (Y/N): Y Opioid Treatment Agreement last reviewed with patient:   09/05/22 NCCSRS/PDMP reviewed this encounter (include red flags): Yes   Management will include: Low Risk (<10 MME) UDS every 6-12 months NCCSR check every visit Follow up Q3M initially, Q6M once established    Hamstring tightness of right lower extremity Assessment & Plan: Failed PT, HEP w/ stretching Given + symptoms with SLR, suspect partially due to spinal stenosis Neurosurgery pending in April   Other orders -     Gabapentin; Start with a half a tablet just before bed. After 1-2 weeks, if tolerating well can increase to a half tab three times per day.  Dispense: 45 tablet; Refill: 2 -     traMADol HCl; Take 1 tablet (50 mg total) by mouth every 12 (twelve) hours as needed for severe pain.  Dispense: 30 tablet; Refill: [place], DO 10/05/2022

## 2022-10-05 NOTE — Assessment & Plan Note (Signed)
Prescribed gabapentin 600 mg tabs for nerve pain (pharmacy did not fill 300 mg tabs last time; 600 mg tab preferred per insurance). You will take a half a tab at night to start, and can increase to half a tab three times per day after 1-2 weeks.  Follow up in 1 month

## 2022-10-17 ENCOUNTER — Telehealth: Payer: Self-pay

## 2022-10-17 MED ORDER — OXYCODONE-ACETAMINOPHEN 5-325 MG PO TABS: 1.0000 | ORAL_TABLET | Freq: Two times a day (BID) | ORAL | 0 refills | Status: DC | PRN

## 2022-10-17 NOTE — Telephone Encounter (Signed)
Dr. Shearon Stalls is out of he office. Please advise.  Erin Acosta is taking Tramadol, Gabapentin, & Celebrex as prescribed. She stated she getting very little pain relief. Do you have any suggestions for more relief?   Call back phone [phone].

## 2022-10-17 NOTE — Telephone Encounter (Signed)
PMP was Reviewed.  Dr Shearon Stalls note was reviewed.  Ms. Havey reports no relief with Tramadol.  RX: Oxycodone 5/325 one tablet twice a day as needed for pain.  She will call office in a week with update on medication change, she verbalizes understanding.

## 2022-10-18 ENCOUNTER — Telehealth: Payer: Self-pay

## 2022-10-18 MED ORDER — HYDROCODONE-ACETAMINOPHEN 10-325 MG PO TABS: 1.0000 | ORAL_TABLET | Freq: Two times a day (BID) | ORAL | 0 refills | Status: DC | PRN

## 2022-10-18 NOTE — Addendum Note (Signed)
Addended by: Jones Bales on: 10/18/2022 04:37 PM   Modules accepted: Orders

## 2022-10-18 NOTE — Telephone Encounter (Signed)
Per Humberto Seals called:  She can not take the Oxycodone 5-325 MG or Percocet. She is highly allergic to the medication. Patient has requested, "Hydrocodone 5 MG." Patient stated a lower dose will not work.  Or another pain medication.   Call back phone [phone]. (The Oxycodone 5-325 mg has been cancelled at 3:06 pm today, by phone).

## 2022-10-18 NOTE — Telephone Encounter (Signed)
[facility] was called today Oxycodone never filled  PMP was Reviewed, Hydrocodone e-scribed today. Tramadol will be discontinued once Hydrocodone approved.  Call Placed to Ms. Kovatch, she verbalizes understanding.

## 2022-10-20 ENCOUNTER — Telehealth: Payer: Medicare Other | Admitting: Family Medicine

## 2022-10-25 NOTE — Telephone Encounter (Signed)
Task completed

## 2022-10-27 ENCOUNTER — Telehealth: Payer: Medicare Other | Admitting: Family Medicine

## 2022-10-27 DIAGNOSIS — Z91199 Patient's noncompliance with other medical treatment and regimen due to unspecified reason: Secondary | ICD-10-CM

## 2022-10-27 NOTE — Progress Notes (Signed)
Called patient multiple times at the time of her appt and up until 10 minutes passed her appt. Since could not reach her left final message to please call office in the morning to reschedule to a time convenient for her.

## 2022-11-02 ENCOUNTER — Encounter: Payer: Medicare Other | Attending: Physical Medicine and Rehabilitation | Admitting: Registered Nurse

## 2022-11-02 VITALS — BP 143/69 | HR 70 | Ht 60.0 in | Wt 150.0 lb

## 2022-11-02 DIAGNOSIS — G8929 Other chronic pain: Secondary | ICD-10-CM | POA: Insufficient documentation

## 2022-11-02 DIAGNOSIS — Z79891 Long term (current) use of opiate analgesic: Secondary | ICD-10-CM | POA: Insufficient documentation

## 2022-11-02 DIAGNOSIS — G894 Chronic pain syndrome: Secondary | ICD-10-CM | POA: Insufficient documentation

## 2022-11-02 DIAGNOSIS — M5416 Radiculopathy, lumbar region: Secondary | ICD-10-CM | POA: Insufficient documentation

## 2022-11-02 DIAGNOSIS — M1611 Unilateral primary osteoarthritis, right hip: Secondary | ICD-10-CM | POA: Insufficient documentation

## 2022-11-02 DIAGNOSIS — Z5181 Encounter for therapeutic drug level monitoring: Secondary | ICD-10-CM | POA: Insufficient documentation

## 2022-11-02 DIAGNOSIS — M25551 Pain in right hip: Secondary | ICD-10-CM | POA: Diagnosis not present

## 2022-11-02 MED ORDER — HYDROCODONE-ACETAMINOPHEN 10-325 MG PO TABS: ORAL_TABLET | ORAL | 0 refills | Status: DC

## 2022-11-02 NOTE — Progress Notes (Signed)
Subjective:    Patient ID: Erin Acosta, female    DOB: May 30, 1951, 70 y.o.   MRN: [id number]  HPI: Erin Acosta is a 71 y.o. female who returns for follow up appointment for chronic pain and medication refill. She states her pain is located in  her lower back radiating into her right lower extremity, and right hip pain. She rates her pain 6.Her  current exercise regime is walking and light yard work.  Ms. Shor Morphine equivalent is 20.00 MME.   UDS ordered today.    Pain Inventory Average Pain 6 Pain Right Now 6 My pain is stabbing and aching  In the last 24 hours, has pain interfered with the following? General activity 2 Relation with others 0 Enjoyment of life 4 What TIME of day is your pain at its worst? morning  Sleep (in general) Fair  Pain is worse with: standing and some activites Pain improves with: pacing activities and medication Relief from Meds: 5  Family History  Problem Relation Age of Onset   Arthritis Other    Hyperlipidemia Other    Heart disease Other    Stroke Other    Hypertension Other    Social History   Socioeconomic History   Marital status: Widowed    Spouse name: Not on file   Number of children: Not on file   Years of education: Not on file   Highest education level: Not on file  Occupational History   Not on file  Tobacco Use   Smoking status: Never   Smokeless tobacco: Never  Vaping Use   Vaping Use: Never used  Substance and Sexual Activity   Alcohol use: Yes    Alcohol/week: 1.0 standard drink of alcohol    Types: 1 Standard drinks or equivalent per week    Comment: Wine in the evening   Drug use: No   Sexual activity: Not on file  Other Topics Concern   Not on file  Social History Narrative   Not on file   Social Determinants of Health   Financial Resource Strain: Low Risk  (10/08/2021)   Overall Financial Resource Strain (CARDIA)    Difficulty of Paying Living Expenses: Not hard at all  Food Insecurity: No  Food Insecurity (10/08/2021)   Hunger Vital Sign    Worried About Running Out of Food in the Last Year: Never true    Ran Out of Food in the Last Year: Never true  Transportation Needs: No Transportation Needs (10/08/2021)   PRAPARE - Administrator, Civil Service (Medical): No    Lack of Transportation (Non-Medical): No  Physical Activity: Inactive (10/08/2021)   Exercise Vital Sign    Days of Exercise per Week: 0 days    Minutes of Exercise per Session: 0 min  Stress: No Stress Concern Present (10/08/2021)   [employer] of Occupational Health - Occupational Stress Questionnaire    Feeling of Stress : Only a little  Social Connections: Socially Isolated (10/08/2021)   Social Connection and Isolation Panel [NHANES]    Frequency of Communication with Friends and Family: More than three times a week    Frequency of Social Gatherings with Friends and Family: More than three times a week    Attends Religious Services: Never    Database administrator or Organizations: No    Attends Banker Meetings: Never    Marital Status: Widowed   Past Surgical History:  Procedure Laterality Date   BILATERAL  CARPAL TUNNEL RELEASE     BLEPHAROPLASTY     BREAST BIOPSY     unsure which breast   CESAREAN SECTION     FOOT SURGERY Right    mini face lift     TOTAL SHOULDER ARTHROPLASTY Right    [facility]   TRIGGER FINGER RELEASE     tummy tuck     Past Surgical History:  Procedure Laterality Date   BILATERAL CARPAL TUNNEL RELEASE     BLEPHAROPLASTY     BREAST BIOPSY     unsure which breast   CESAREAN SECTION     FOOT SURGERY Right    mini face lift     TOTAL SHOULDER ARTHROPLASTY Right    [facility]   TRIGGER FINGER RELEASE     tummy tuck     Past Medical History:  Diagnosis Date   Allergy    Anxiety    Depression    Genital herpes    H/O cesarean section    Hypertension    Rotator cuff tear    Scoliosis    BP (!) 143/69   Pulse 70   Ht 5'  (1.524 m)   Wt 150 lb (68 kg)   SpO2 97%   BMI 29.29 kg/m   Opioid Risk Score:   Fall Risk Score:  `1  Depression screen [facility] 2/9     10/05/2022   11:28 AM 09/05/2022   10:45 AM 10/08/2021    2:40 PM 04/06/2021    5:53 PM 05/07/2020    8:08 AM 04/21/2020   10:26 AM 10/29/2019   11:31 AM  Depression screen PHQ 2/9  Decreased Interest 0 0 0 0 0 1 0  Down, Depressed, Hopeless 0 0 0 1 0 0 0  PHQ - 2 Score 0 0 0 1 0 1 0  Altered sleeping  1  2     Tired, decreased energy  1  2     Change in appetite  0  1     Feeling bad or failure about yourself   0  1     Trouble concentrating  0  0     Moving slowly or fidgety/restless  0  0     Suicidal thoughts  0  0     PHQ-9 Score  2  7         Review of Systems  Musculoskeletal:        Right leg pain  All other systems reviewed and are negative.     Objective:   Physical Exam Vitals and nursing note reviewed.  Constitutional:      Appearance: Normal appearance.  Cardiovascular:     Rate and Rhythm: Normal rate and regular rhythm.     Pulses: Normal pulses.     Heart sounds: Normal heart sounds.  Pulmonary:     Effort: Pulmonary effort is normal.     Breath sounds: Normal breath sounds.  Musculoskeletal:     Cervical back: Normal range of motion and neck supple.     Comments: Normal Muscle Bulk and Muscle Testing Reveals:  Upper Extremities:Full  ROM and Muscle Strength 5/5  Lumbar Paraspinal Tenderness: L-4-L-5 Right Greater Trochanter Tenderness Lower Extremities: Full ROM and Muscle Strength 5/5 Arises from Chair with ease Narrow Based  Gait     Skin:    General: Skin is warm and dry.  Neurological:     Mental Status: She is alert and oriented to person, place, and time.  Psychiatric:        Mood and Affect: Mood normal.        Behavior: Behavior normal.         Assessment & Plan:  Right Lumbar Radiculitis: Continue HEP as Tolerated. Continue to Monitor.  Chronic Right Hip/ OA: Continue HEP as Tolerated.  Continue to Monitor.  Chronic Pain Syndrome: Refilled: Hydrocodone 10/325 mg one tablet twice a day as needed for [pain #60. We will continue the opioid monitoring program, this consists of regular clinic visits, examinations, urine drug screen, pill counts as well as use of [place] Controlled Substance Reporting system. A 12 month History has been reviewed on the [place] Controlled Substance Reporting System on 11/02/2022,  F/U with Dr Shearon Stalls

## 2022-11-05 LAB — TOXASSURE SELECT,+ANTIDEPR,UR

## 2022-11-09 ENCOUNTER — Encounter: Payer: Self-pay | Admitting: Registered Nurse

## 2022-11-10 ENCOUNTER — Telehealth: Payer: Self-pay | Admitting: *Deleted

## 2022-11-10 NOTE — Telephone Encounter (Signed)
Urine drug screen for this encounter is consistent for prescribed medication 

## 2022-11-22 DIAGNOSIS — Z01818 Encounter for other preprocedural examination: Secondary | ICD-10-CM | POA: Diagnosis not present

## 2022-11-22 DIAGNOSIS — Z79899 Other long term (current) drug therapy: Secondary | ICD-10-CM | POA: Diagnosis not present

## 2022-11-22 DIAGNOSIS — M1611 Unilateral primary osteoarthritis, right hip: Secondary | ICD-10-CM | POA: Diagnosis not present

## 2022-11-25 ENCOUNTER — Telehealth: Payer: Self-pay

## 2022-11-25 NOTE — Telephone Encounter (Signed)
Received Pre-op clearance form for patient from [facility]. Pt having Right total hip replacement. Pt needs a pre-op appt please

## 2022-11-28 NOTE — Telephone Encounter (Signed)
Called patient and LVM to get appt scheduled

## 2022-12-01 ENCOUNTER — Encounter: Payer: Self-pay | Admitting: Family Medicine

## 2022-12-01 ENCOUNTER — Ambulatory Visit (INDEPENDENT_AMBULATORY_CARE_PROVIDER_SITE_OTHER): Payer: Medicare Other | Admitting: Family Medicine

## 2022-12-01 VITALS — BP 130/70 | HR 71 | Temp 97.6°F | Resp 18 | Ht 60.0 in | Wt 148.4 lb

## 2022-12-01 DIAGNOSIS — R21 Rash and other nonspecific skin eruption: Secondary | ICD-10-CM

## 2022-12-01 DIAGNOSIS — U071 COVID-19: Secondary | ICD-10-CM | POA: Diagnosis not present

## 2022-12-01 DIAGNOSIS — Z01818 Encounter for other preprocedural examination: Secondary | ICD-10-CM | POA: Diagnosis not present

## 2022-12-01 DIAGNOSIS — L709 Acne, unspecified: Secondary | ICD-10-CM

## 2022-12-01 MED ORDER — TRETINOIN 0.025 % EX CREA: TOPICAL_CREAM | Freq: Every day | CUTANEOUS | 0 refills | Status: AC

## 2022-12-01 NOTE — Patient Instructions (Signed)
Hip Arthroscopy Hip arthroscopy is a surgery to examine the inside of the hip joint and repair any damage. You may have this surgery if you have: Loose pieces of bone or cartilage that are causing pain. An overgrowth of bone that has damaged some soft tissue. A tear in the cartilage that lines the rim of the hip socket (labrum). Swollen tissue around the hip joint. Damage to the hip joint from tissues that connect muscle to bone (tendons) repeatedly rubbing across the joint. An advanced infection in the hip (septic hip). Hip pain that does not go away with other treatments. Arthroscopic surgery is done using a thin tube that has a light and camera on the end of it (arthroscope). The arthroscope is placed through a small incision, and the camera sends images to a screen in the operating room. The images are used to help perform the surgery. Tell a health care provider about: Any allergies you have. All medicines you are taking, including vitamins, herbs, eye drops, creams, and over-the-counter medicines. Any problems you or family members have had with anesthetic medicines. Any blood disorders you have. Any surgeries you have had. Any medical conditions you have. Whether you are pregnant or may be pregnant. What are the risks? Generally, this is a safe procedure. However, problems may occur, including: Infection. Bleeding. Allergic reactions to medicines. Damage to blood vessels, nerves, tissues that connect bones to each other (ligaments), or cartilage in the hip. A blood clot that forms in the leg and travels to the lung (pulmonary embolism). Failure of the surgery to relieve symptoms. Hip stiffness. What happens before the procedure? Staying hydrated Follow instructions from your health care provider about hydration, which may include: Up to 2 hours before the procedure - you may continue to drink clear liquids, such as water, clear fruit juice, black coffee, and plain tea.  Eating  and drinking restrictions Follow instructions from your health care provider about eating and drinking, which may include: 8 hours before the procedure - stop eating heavy meals or foods, such as meat, fried foods, or fatty foods. 6 hours before the procedure - stop eating light meals or foods, such as toast or cereal. 6 hours before the procedure - stop drinking milk or drinks that contain milk. 2 hours before the procedure - stop drinking clear liquids. Medicines Ask your health care provider about: Changing or stopping your regular medicines. This is especially important if you are taking diabetes medicines or blood thinners. Taking medicines such as aspirin and ibuprofen. These medicines can thin your blood. Do not take these medicines unless your health care provider tells you to take them. Taking over-the-counter medicines, vitamins, herbs, and supplements. General instructions Do not drink alcohol if your health care provider tells you not to drink. Do not use any products that contain nicotine or tobacco for at least 4 weeks before the procedure. These products include cigarettes, e-cigarettes, and chewing tobacco. If you need help quitting, ask your health care provider. You may have a physical exam and tests, such as an X-ray, CT scan, or MRI. Ask your health care provider what steps will be taken to help prevent infection. These steps may include: Removing hair at the surgery site. Washing skin with a germ-killing soap. Plan to have a responsible adult take you home from the hospital or clinic. Plan to have a responsible adult care for you for the time you are told after you leave the hospital or clinic. This is important. What happens during  the procedure?  An IV will be inserted into one of your veins. You will be given one or more of the following: A medicine to help you relax (sedative). A medicine to numb the hip area (local anesthetic). A medicine to make you fall asleep  (general anesthetic). A medicine that is injected into your spine to numb the area below and slightly above the injection site (spinal anesthetic). A medicine that is injected into an area of your body to numb everything below the injection site (regional anesthetic). This may be injected into your groin or thigh. Your hip bone (pelvis) will be pulled slightly away from your thighbone (femur) socket. This provides more room for surgical instruments. Several small incisions will be made in your hip area. Your hip joint will be flushed and filled with a germ-free solution made of salt and water (sterile saline). This expands the area to allow your surgeon to see the joint more clearly. An arthroscope will be passed through one of your incisions, into your hip joint. Other surgical instruments will be passed through the other incisions. Your surgeon will examine and repair your hip as needed. The sterile saline will be drained from your hip. Your incisions will be closed with adhesive strips or stitches (sutures) and covered with a bandage (dressing). The procedure may vary among health care providers and hospitals. What happens after the procedure? Your blood pressure, heart rate, breathing rate, and blood oxygen level will be monitored until you leave the hospital or clinic. You will be given pain medicine as needed. You may be given medicine to lower your risk of blood clots. You may have to wear compression stockings. These stockings help to prevent blood clots and reduce swelling in your legs. Do not drive until your health care provider says that it is safe. Summary Hip arthroscopy is a surgery to examine the inside of the hip joint and repair any damage. Before the procedure, follow instructions from your health care provider about eating and drinking. Plan to have a responsible adult take you home from the hospital or clinic. This information is not intended to replace advice given to you  by your health care provider. Make sure you discuss any questions you have with your health care provider. Document Revised: 03/17/2020 Document Reviewed: 03/17/2020 Elsevier Patient Education  [place].

## 2022-12-01 NOTE — Progress Notes (Signed)
Subjective:   By signing my name below, I, [name], attest that this documentation has been prepared under the direction and in the presence of [name], DO. 12/01/2022   Patient ID: Erin Acosta, female    DOB: [birth year], 72 y.o.   MRN: [id number]  Chief Complaint  Patient presents with   Pre-op Exam    HPI Patient is in today for an pre-op appointment.   She has an upcomming right total hip replacement procedure. She is having a posterior procedure. She has completed blood work prior for her procedure. She is UTD on EKG. She occasionally gets nauseas after taking anesthesia, otherwise she has no other known reactions with anaesthesia. She has received clearance from her cardiology for this upcomming procedure.  She continuous having acne on her face. She is applying retin-A to and moisturizer to manage her symptoms and finds mild relief. She is planning on finding a new dermatologist to manage her acne.     Past Medical History:  Diagnosis Date   Allergy    Anxiety    Depression    Genital herpes    H/O cesarean section    Hypertension    Rotator cuff tear    Scoliosis     Past Surgical History:  Procedure Laterality Date   BILATERAL CARPAL TUNNEL RELEASE     BLEPHAROPLASTY     BREAST BIOPSY     unsure which breast   CESAREAN SECTION     FOOT SURGERY Right    mini face lift     TOTAL SHOULDER ARTHROPLASTY Right    [facility]   TRIGGER FINGER RELEASE     tummy tuck      Family History  Problem Relation Age of Onset   Arthritis Other    Hyperlipidemia Other    Heart disease Other    Stroke Other    Hypertension Other     Social History   Socioeconomic History   Marital status: Widowed    Spouse name: Not on file   Number of children: Not on file   Years of education: Not on file   Highest education level: Not on file  Occupational History   Not on file  Tobacco Use   Smoking status: Never   Smokeless tobacco: Never  Vaping  Use   Vaping Use: Never used  Substance and Sexual Activity   Alcohol use: Yes    Alcohol/week: 1.0 standard drink of alcohol    Types: 1 Standard drinks or equivalent per week    Comment: Wine in the evening   Drug use: No   Sexual activity: Not on file  Other Topics Concern   Not on file  Social History Narrative   Not on file   Social Determinants of Health   Financial Resource Strain: Low Risk  (10/08/2021)   Overall Financial Resource Strain (CARDIA)    Difficulty of Paying Living Expenses: Not hard at all  Food Insecurity: No Food Insecurity (10/08/2021)   Hunger Vital Sign    Worried About Running Out of Food in the Last Year: Never true    Ran Out of Food in the Last Year: Never true  Transportation Needs: No Transportation Needs (10/08/2021)   PRAPARE - Hydrologist (Medical): No    Lack of Transportation (Non-Medical): No  Physical Activity: Inactive (10/08/2021)   Exercise Vital Sign    Days of Exercise per Week: 0 days    Minutes  of Exercise per Session: 0 min  Stress: No Stress Concern Present (10/08/2021)   [place]    Feeling of Stress : Only a little  Social Connections: Socially Isolated (10/08/2021)   Social Connection and Isolation Panel [NHANES]    Frequency of Communication with Friends and Family: More than three times a week    Frequency of Social Gatherings with Friends and Family: More than three times a week    Attends Religious Services: Never    Marine scientist or Organizations: No    Attends Archivist Meetings: Never    Marital Status: Widowed  Intimate Partner Violence: Not At Risk (10/08/2021)   Humiliation, Afraid, Rape, and Kick questionnaire    Fear of Current or Ex-Partner: No    Emotionally Abused: No    Physically Abused: No    Sexually Abused: No    Outpatient Medications Prior to Visit  Medication Sig Dispense  Refill   aspirin EC 81 MG tablet Take 81 mg by mouth daily.     atorvastatin (LIPITOR) 40 MG tablet TAKE 1 TABLET(40 MG) BY MOUTH DAILY 90 tablet 1   cetirizine (ZYRTEC) 10 MG tablet TAKE 1 TABLET DAILY 90 tablet 3   clonazePAM (KLONOPIN) 1 MG tablet Take 1 tablet (1 mg total) by mouth 2 (two) times daily as needed. 180 tablet 1   escitalopram (LEXAPRO) 20 MG tablet TAKE 1 TABLET DAILY 90 tablet 1   gabapentin (NEURONTIN) 600 MG tablet Start with a half a tablet just before bed. After 1-2 weeks, if tolerating well can increase to a half tab three times per day. 45 tablet 2   HYDROcodone-acetaminophen (NORCO) 10-325 MG tablet One tablet in the morning,  one tablet in the afternoon and may take 1/2- 1 tablet at bedtime. 90 tablet 0   mirabegron ER (MYRBETRIQ) 50 MG TB24 tablet TAKE 1 TABLET DAILY 90 tablet 1   Probiotic Product (PROBIOTIC DAILY PO) Take by mouth.     traMADol (ULTRAM) 50 MG tablet Take 1 tablet (50 mg total) by mouth every 12 (twelve) hours as needed for severe pain. 30 tablet 0   valACYclovir (VALTREX) 500 MG tablet TAKE 1 TABLET DAILY 90 tablet 3   No facility-administered medications prior to visit.    Allergies  Allergen Reactions   Oxycodone-Acetaminophen Anaphylaxis and Itching    Headache, delusion   Nickel Other (See Comments)    Redness and pain    Review of Systems  Constitutional:  Negative for fever and malaise/fatigue.  HENT:  Negative for congestion.   Eyes:  Negative for blurred vision.  Respiratory:  Negative for shortness of breath.   Cardiovascular:  Negative for chest pain, palpitations and leg swelling.  Gastrointestinal:  Negative for abdominal pain, blood in stool and nausea.  Genitourinary:  Negative for dysuria and frequency.  Musculoskeletal:  Negative for falls.  Skin:  Negative for rash.  Neurological:  Negative for dizziness, loss of consciousness and headaches.  Endo/Heme/Allergies:  Negative for environmental allergies.   Psychiatric/Behavioral:  Negative for depression. The patient is not nervous/anxious.        Objective:    Physical Exam Constitutional:      Appearance: Normal appearance.  HENT:     Head: Normocephalic and atraumatic.     Right Ear: External ear normal.     Left Ear: External ear normal.  Eyes:     Extraocular Movements: Extraocular movements intact.  Pupils: Pupils are equal, round, and reactive to light.  Cardiovascular:     Rate and Rhythm: Normal rate and regular rhythm.     Heart sounds: Normal heart sounds. No murmur heard.    No gallop.  Pulmonary:     Effort: Pulmonary effort is normal. No respiratory distress.     Breath sounds: Normal breath sounds. No wheezing or rales.  Neurological:     Mental Status: She is alert and oriented to person, place, and time.  Psychiatric:        Judgment: Judgment normal.     BP 130/70 (BP Location: Right Arm, Patient Position: Sitting, Cuff Size: Normal)   Pulse 71   Temp 97.6 F (36.4 C) (Oral)   Resp 18   Ht 5' (1.524 m)   Wt 148 lb 6.4 oz (67.3 kg)   SpO2 97%   BMI 28.98 kg/m  Wt Readings from Last 3 Encounters:  12/01/22 148 lb 6.4 oz (67.3 kg)  11/02/22 150 lb (68 kg)  10/05/22 151 lb 12.8 oz (68.9 kg)       Assessment & Plan:  Pre-op examination Assessment & Plan: Pt is cleared for surgery    Adult acne -     Ambulatory referral to Dermatology  Rash associated with COVID-19 -     Ambulatory referral to Dermatology  Other orders -     Tretinoin; Apply topically at bedtime.  Dispense: 45 g; Refill: 0    I, [name], DO, personally preformed the services described in this documentation.  All medical record entries made by the scribe were at my direction and in my presence.  I have reviewed the chart and discharge instructions (if applicable) and agree that the record reflects my personal performance and is accurate and complete. 12/01/2022   I,[name],acting as a scribe for [name], DO.,have documented all relevant documentation on the behalf of [name], DO,as directed by  [name], DO while in the presence of [name], DO.   [name], DO

## 2022-12-04 DIAGNOSIS — Z01818 Encounter for other preprocedural examination: Secondary | ICD-10-CM | POA: Insufficient documentation

## 2022-12-04 NOTE — Assessment & Plan Note (Signed)
Pt is cleared for surgery.

## 2022-12-07 ENCOUNTER — Telehealth: Payer: Self-pay | Admitting: Family Medicine

## 2022-12-07 ENCOUNTER — Other Ambulatory Visit: Payer: Self-pay | Admitting: Family Medicine

## 2022-12-07 NOTE — Telephone Encounter (Signed)
Pt states she meant to discuss this with Dr.Lowne but forgot. She was prescribed Gentamicin 0.1% cream by her previous dermatologist and was wondering if Dr.Lowne could prescribe this until she gets established with a new dermatologist. Also she uses Olopatadine (Pataday) eye drops but they are very expensive otc so she was wondering if pcp could call that in as well.    [facility], [place] [address] [place] [address] Phone: [phone]  Fax: [phone]

## 2022-12-08 ENCOUNTER — Other Ambulatory Visit: Payer: Self-pay | Admitting: Family Medicine

## 2022-12-08 MED ORDER — OLOPATADINE HCL 0.1 % OP SOLN: 1.0000 [drp] | Freq: Two times a day (BID) | OPHTHALMIC | 12 refills | Status: DC

## 2022-12-08 MED ORDER — GENTAMICIN SULFATE 0.1 % EX CREA: 1.0000 | TOPICAL_CREAM | Freq: Three times a day (TID) | CUTANEOUS | 0 refills | Status: DC

## 2022-12-08 NOTE — Telephone Encounter (Signed)
Tried calling Pt to inform Rx was sent but line is busy.

## 2022-12-13 DIAGNOSIS — Z955 Presence of coronary angioplasty implant and graft: Secondary | ICD-10-CM | POA: Diagnosis not present

## 2022-12-13 DIAGNOSIS — G8918 Other acute postprocedural pain: Secondary | ICD-10-CM | POA: Diagnosis not present

## 2022-12-13 DIAGNOSIS — M1612 Unilateral primary osteoarthritis, left hip: Secondary | ICD-10-CM | POA: Diagnosis not present

## 2022-12-13 DIAGNOSIS — M47816 Spondylosis without myelopathy or radiculopathy, lumbar region: Secondary | ICD-10-CM | POA: Diagnosis not present

## 2022-12-13 DIAGNOSIS — M160 Bilateral primary osteoarthritis of hip: Secondary | ICD-10-CM | POA: Diagnosis not present

## 2022-12-13 DIAGNOSIS — I2510 Atherosclerotic heart disease of native coronary artery without angina pectoris: Secondary | ICD-10-CM | POA: Diagnosis not present

## 2022-12-13 DIAGNOSIS — Z96641 Presence of right artificial hip joint: Secondary | ICD-10-CM | POA: Diagnosis not present

## 2022-12-13 DIAGNOSIS — Z471 Aftercare following joint replacement surgery: Secondary | ICD-10-CM | POA: Diagnosis not present

## 2022-12-13 DIAGNOSIS — M1611 Unilateral primary osteoarthritis, right hip: Secondary | ICD-10-CM | POA: Diagnosis not present

## 2022-12-14 DIAGNOSIS — M1611 Unilateral primary osteoarthritis, right hip: Secondary | ICD-10-CM | POA: Diagnosis not present

## 2022-12-14 DIAGNOSIS — I2510 Atherosclerotic heart disease of native coronary artery without angina pectoris: Secondary | ICD-10-CM | POA: Diagnosis not present

## 2022-12-14 DIAGNOSIS — Z955 Presence of coronary angioplasty implant and graft: Secondary | ICD-10-CM | POA: Diagnosis not present

## 2022-12-16 DIAGNOSIS — Z96641 Presence of right artificial hip joint: Secondary | ICD-10-CM | POA: Diagnosis not present

## 2022-12-16 DIAGNOSIS — Z4801 Encounter for change or removal of surgical wound dressing: Secondary | ICD-10-CM | POA: Diagnosis not present

## 2022-12-16 DIAGNOSIS — Z471 Aftercare following joint replacement surgery: Secondary | ICD-10-CM | POA: Diagnosis not present

## 2022-12-16 DIAGNOSIS — R269 Unspecified abnormalities of gait and mobility: Secondary | ICD-10-CM | POA: Diagnosis not present

## 2022-12-16 DIAGNOSIS — M6281 Muscle weakness (generalized): Secondary | ICD-10-CM | POA: Diagnosis not present

## 2022-12-16 DIAGNOSIS — E785 Hyperlipidemia, unspecified: Secondary | ICD-10-CM | POA: Diagnosis not present

## 2022-12-18 DIAGNOSIS — E785 Hyperlipidemia, unspecified: Secondary | ICD-10-CM | POA: Diagnosis not present

## 2022-12-18 DIAGNOSIS — R269 Unspecified abnormalities of gait and mobility: Secondary | ICD-10-CM | POA: Diagnosis not present

## 2022-12-18 DIAGNOSIS — Z4801 Encounter for change or removal of surgical wound dressing: Secondary | ICD-10-CM | POA: Diagnosis not present

## 2022-12-18 DIAGNOSIS — M6281 Muscle weakness (generalized): Secondary | ICD-10-CM | POA: Diagnosis not present

## 2022-12-18 DIAGNOSIS — Z96641 Presence of right artificial hip joint: Secondary | ICD-10-CM | POA: Diagnosis not present

## 2022-12-18 DIAGNOSIS — Z471 Aftercare following joint replacement surgery: Secondary | ICD-10-CM | POA: Diagnosis not present

## 2022-12-21 DIAGNOSIS — R269 Unspecified abnormalities of gait and mobility: Secondary | ICD-10-CM | POA: Diagnosis not present

## 2022-12-21 DIAGNOSIS — Z96641 Presence of right artificial hip joint: Secondary | ICD-10-CM | POA: Diagnosis not present

## 2022-12-21 DIAGNOSIS — Z4801 Encounter for change or removal of surgical wound dressing: Secondary | ICD-10-CM | POA: Diagnosis not present

## 2022-12-21 DIAGNOSIS — Z471 Aftercare following joint replacement surgery: Secondary | ICD-10-CM | POA: Diagnosis not present

## 2022-12-21 DIAGNOSIS — M6281 Muscle weakness (generalized): Secondary | ICD-10-CM | POA: Diagnosis not present

## 2022-12-21 DIAGNOSIS — E785 Hyperlipidemia, unspecified: Secondary | ICD-10-CM | POA: Diagnosis not present

## 2022-12-22 DIAGNOSIS — M6281 Muscle weakness (generalized): Secondary | ICD-10-CM | POA: Diagnosis not present

## 2022-12-22 DIAGNOSIS — R269 Unspecified abnormalities of gait and mobility: Secondary | ICD-10-CM | POA: Diagnosis not present

## 2022-12-22 DIAGNOSIS — E785 Hyperlipidemia, unspecified: Secondary | ICD-10-CM | POA: Diagnosis not present

## 2022-12-22 DIAGNOSIS — Z96641 Presence of right artificial hip joint: Secondary | ICD-10-CM | POA: Diagnosis not present

## 2022-12-22 DIAGNOSIS — Z471 Aftercare following joint replacement surgery: Secondary | ICD-10-CM | POA: Diagnosis not present

## 2022-12-22 DIAGNOSIS — Z4801 Encounter for change or removal of surgical wound dressing: Secondary | ICD-10-CM | POA: Diagnosis not present

## 2022-12-24 ENCOUNTER — Other Ambulatory Visit: Payer: Self-pay | Admitting: Family Medicine

## 2022-12-24 DIAGNOSIS — F419 Anxiety disorder, unspecified: Secondary | ICD-10-CM

## 2022-12-24 DIAGNOSIS — R32 Unspecified urinary incontinence: Secondary | ICD-10-CM

## 2022-12-26 DIAGNOSIS — M6281 Muscle weakness (generalized): Secondary | ICD-10-CM | POA: Diagnosis not present

## 2022-12-26 DIAGNOSIS — Z96641 Presence of right artificial hip joint: Secondary | ICD-10-CM | POA: Diagnosis not present

## 2022-12-26 DIAGNOSIS — E785 Hyperlipidemia, unspecified: Secondary | ICD-10-CM | POA: Diagnosis not present

## 2022-12-26 DIAGNOSIS — Z471 Aftercare following joint replacement surgery: Secondary | ICD-10-CM | POA: Diagnosis not present

## 2022-12-26 DIAGNOSIS — R269 Unspecified abnormalities of gait and mobility: Secondary | ICD-10-CM | POA: Diagnosis not present

## 2022-12-26 DIAGNOSIS — Z4801 Encounter for change or removal of surgical wound dressing: Secondary | ICD-10-CM | POA: Diagnosis not present

## 2022-12-27 DIAGNOSIS — Z471 Aftercare following joint replacement surgery: Secondary | ICD-10-CM | POA: Diagnosis not present

## 2022-12-27 DIAGNOSIS — Z96641 Presence of right artificial hip joint: Secondary | ICD-10-CM | POA: Diagnosis not present

## 2022-12-29 DIAGNOSIS — Z471 Aftercare following joint replacement surgery: Secondary | ICD-10-CM | POA: Diagnosis not present

## 2022-12-29 DIAGNOSIS — Z96641 Presence of right artificial hip joint: Secondary | ICD-10-CM | POA: Diagnosis not present

## 2022-12-29 DIAGNOSIS — E785 Hyperlipidemia, unspecified: Secondary | ICD-10-CM | POA: Diagnosis not present

## 2022-12-29 DIAGNOSIS — M6281 Muscle weakness (generalized): Secondary | ICD-10-CM | POA: Diagnosis not present

## 2022-12-29 DIAGNOSIS — Z4801 Encounter for change or removal of surgical wound dressing: Secondary | ICD-10-CM | POA: Diagnosis not present

## 2022-12-29 DIAGNOSIS — R269 Unspecified abnormalities of gait and mobility: Secondary | ICD-10-CM | POA: Diagnosis not present

## 2022-12-30 DIAGNOSIS — E785 Hyperlipidemia, unspecified: Secondary | ICD-10-CM | POA: Diagnosis not present

## 2022-12-30 DIAGNOSIS — Z471 Aftercare following joint replacement surgery: Secondary | ICD-10-CM | POA: Diagnosis not present

## 2022-12-30 DIAGNOSIS — R269 Unspecified abnormalities of gait and mobility: Secondary | ICD-10-CM | POA: Diagnosis not present

## 2022-12-30 DIAGNOSIS — Z4801 Encounter for change or removal of surgical wound dressing: Secondary | ICD-10-CM | POA: Diagnosis not present

## 2022-12-30 DIAGNOSIS — M6281 Muscle weakness (generalized): Secondary | ICD-10-CM | POA: Diagnosis not present

## 2022-12-30 DIAGNOSIS — Z96641 Presence of right artificial hip joint: Secondary | ICD-10-CM | POA: Diagnosis not present

## 2023-01-04 DIAGNOSIS — Z471 Aftercare following joint replacement surgery: Secondary | ICD-10-CM | POA: Diagnosis not present

## 2023-01-04 DIAGNOSIS — Z96641 Presence of right artificial hip joint: Secondary | ICD-10-CM | POA: Diagnosis not present

## 2023-01-04 DIAGNOSIS — Z4801 Encounter for change or removal of surgical wound dressing: Secondary | ICD-10-CM | POA: Diagnosis not present

## 2023-01-04 DIAGNOSIS — E785 Hyperlipidemia, unspecified: Secondary | ICD-10-CM | POA: Diagnosis not present

## 2023-01-04 DIAGNOSIS — M6281 Muscle weakness (generalized): Secondary | ICD-10-CM | POA: Diagnosis not present

## 2023-01-04 DIAGNOSIS — R269 Unspecified abnormalities of gait and mobility: Secondary | ICD-10-CM | POA: Diagnosis not present

## 2023-01-05 DIAGNOSIS — Z96641 Presence of right artificial hip joint: Secondary | ICD-10-CM | POA: Diagnosis not present

## 2023-01-05 DIAGNOSIS — R269 Unspecified abnormalities of gait and mobility: Secondary | ICD-10-CM | POA: Diagnosis not present

## 2023-01-05 DIAGNOSIS — Z471 Aftercare following joint replacement surgery: Secondary | ICD-10-CM | POA: Diagnosis not present

## 2023-01-05 DIAGNOSIS — Z4801 Encounter for change or removal of surgical wound dressing: Secondary | ICD-10-CM | POA: Diagnosis not present

## 2023-01-05 DIAGNOSIS — M6281 Muscle weakness (generalized): Secondary | ICD-10-CM | POA: Diagnosis not present

## 2023-01-05 DIAGNOSIS — E785 Hyperlipidemia, unspecified: Secondary | ICD-10-CM | POA: Diagnosis not present

## 2023-01-10 DIAGNOSIS — Z471 Aftercare following joint replacement surgery: Secondary | ICD-10-CM | POA: Diagnosis not present

## 2023-01-10 DIAGNOSIS — Z4801 Encounter for change or removal of surgical wound dressing: Secondary | ICD-10-CM | POA: Diagnosis not present

## 2023-01-10 DIAGNOSIS — R269 Unspecified abnormalities of gait and mobility: Secondary | ICD-10-CM | POA: Diagnosis not present

## 2023-01-10 DIAGNOSIS — Z96641 Presence of right artificial hip joint: Secondary | ICD-10-CM | POA: Diagnosis not present

## 2023-01-10 DIAGNOSIS — M6281 Muscle weakness (generalized): Secondary | ICD-10-CM | POA: Diagnosis not present

## 2023-01-10 DIAGNOSIS — E785 Hyperlipidemia, unspecified: Secondary | ICD-10-CM | POA: Diagnosis not present

## 2023-01-16 ENCOUNTER — Ambulatory Visit: Payer: Medicare Other | Admitting: Physical Medicine and Rehabilitation

## 2023-01-16 DIAGNOSIS — M1611 Unilateral primary osteoarthritis, right hip: Secondary | ICD-10-CM | POA: Diagnosis not present

## 2023-01-18 ENCOUNTER — Ambulatory Visit: Payer: Medicare Other | Admitting: Physical Medicine and Rehabilitation

## 2023-01-23 DIAGNOSIS — Z789 Other specified health status: Secondary | ICD-10-CM | POA: Diagnosis not present

## 2023-01-23 DIAGNOSIS — M1611 Unilateral primary osteoarthritis, right hip: Secondary | ICD-10-CM | POA: Diagnosis not present

## 2023-01-23 DIAGNOSIS — Z471 Aftercare following joint replacement surgery: Secondary | ICD-10-CM | POA: Diagnosis not present

## 2023-01-23 DIAGNOSIS — Z7409 Other reduced mobility: Secondary | ICD-10-CM | POA: Diagnosis not present

## 2023-01-23 DIAGNOSIS — Z96641 Presence of right artificial hip joint: Secondary | ICD-10-CM | POA: Diagnosis not present

## 2023-01-24 DIAGNOSIS — Z471 Aftercare following joint replacement surgery: Secondary | ICD-10-CM | POA: Diagnosis not present

## 2023-01-24 DIAGNOSIS — Z96641 Presence of right artificial hip joint: Secondary | ICD-10-CM | POA: Diagnosis not present

## 2023-01-30 DIAGNOSIS — M48061 Spinal stenosis, lumbar region without neurogenic claudication: Secondary | ICD-10-CM | POA: Diagnosis not present

## 2023-01-31 DIAGNOSIS — Z96641 Presence of right artificial hip joint: Secondary | ICD-10-CM | POA: Diagnosis not present

## 2023-01-31 DIAGNOSIS — Z789 Other specified health status: Secondary | ICD-10-CM | POA: Diagnosis not present

## 2023-01-31 DIAGNOSIS — Z471 Aftercare following joint replacement surgery: Secondary | ICD-10-CM | POA: Diagnosis not present

## 2023-01-31 DIAGNOSIS — Z7409 Other reduced mobility: Secondary | ICD-10-CM | POA: Diagnosis not present

## 2023-01-31 DIAGNOSIS — M1611 Unilateral primary osteoarthritis, right hip: Secondary | ICD-10-CM | POA: Diagnosis not present

## 2023-02-07 DIAGNOSIS — Z471 Aftercare following joint replacement surgery: Secondary | ICD-10-CM | POA: Diagnosis not present

## 2023-02-07 DIAGNOSIS — Z7409 Other reduced mobility: Secondary | ICD-10-CM | POA: Diagnosis not present

## 2023-02-07 DIAGNOSIS — Z96641 Presence of right artificial hip joint: Secondary | ICD-10-CM | POA: Diagnosis not present

## 2023-02-07 DIAGNOSIS — Z789 Other specified health status: Secondary | ICD-10-CM | POA: Diagnosis not present

## 2023-02-07 DIAGNOSIS — M1611 Unilateral primary osteoarthritis, right hip: Secondary | ICD-10-CM | POA: Diagnosis not present

## 2023-02-08 ENCOUNTER — Encounter: Payer: Medicare Other | Admitting: Physical Medicine and Rehabilitation

## 2023-02-13 ENCOUNTER — Encounter
Payer: Medicare Other | Attending: Physical Medicine and Rehabilitation | Admitting: Physical Medicine and Rehabilitation

## 2023-02-13 VITALS — BP 143/73 | HR 81 | Ht 60.0 in | Wt 147.0 lb

## 2023-02-13 DIAGNOSIS — M25551 Pain in right hip: Secondary | ICD-10-CM | POA: Insufficient documentation

## 2023-02-13 DIAGNOSIS — Z79891 Long term (current) use of opiate analgesic: Secondary | ICD-10-CM | POA: Diagnosis not present

## 2023-02-13 DIAGNOSIS — G8929 Other chronic pain: Secondary | ICD-10-CM | POA: Diagnosis not present

## 2023-02-13 DIAGNOSIS — M5416 Radiculopathy, lumbar region: Secondary | ICD-10-CM | POA: Diagnosis not present

## 2023-02-13 DIAGNOSIS — G894 Chronic pain syndrome: Secondary | ICD-10-CM | POA: Insufficient documentation

## 2023-02-13 DIAGNOSIS — Z5181 Encounter for therapeutic drug level monitoring: Secondary | ICD-10-CM | POA: Insufficient documentation

## 2023-02-13 DIAGNOSIS — M6289 Other specified disorders of muscle: Secondary | ICD-10-CM

## 2023-02-13 MED ORDER — HYDROCODONE-ACETAMINOPHEN 10-325 MG PO TABS: 1.0000 | ORAL_TABLET | Freq: Three times a day (TID) | ORAL | 0 refills | Status: AC | PRN

## 2023-02-13 NOTE — Patient Instructions (Signed)
Keep up with your PT  Follow up with your surgeons as needed  I have refilled your medications for 3 months. Follow up in 3 months.

## 2023-02-13 NOTE — Progress Notes (Signed)
Subjective:    Patient ID: Erin Acosta, female    DOB: 04-01-1951, 72 y.o.   MRN: [id number]  HPI   Erin Acosta is a 72 y.o. year old female  who  has a past medical history of Allergy, Anxiety, Depression, Genital herpes, H/O cesarean section, Hypertension, Rotator cuff tear, and Scoliosis.   They are presenting to PM&R clinic for follow up related to r hip and low back .  Plan from last visit:  Of note, she is pending R THR 11/2022 with Dr. Kalman Shan at [facility], followed by L3-S1 decompression / fusion with Dr. Georganna Skeans 02/2023.     Bilateral sciatica Assessment & Plan: Prescribed gabapentin 600 mg tabs for nerve pain (pharmacy did not fill 300 mg tabs last time; 600 mg tab preferred per insurance). You will take a half a tab at night to start, and can increase to half a tab three times per day after 1-2 weeks.   Follow up in 1 month     Chronic right hip pain Assessment & Plan: Pending R THR 11/2022 with Dr. Kalman Shan at [place] #30 tabs of Tramadol 50 mg, which you can use up to twice per day. Follow up in 1 month to review effect of medication and obtain further scripts; please bring your pills to that appointment. We will plan to wean this after your surgeries.    Call me if any concerns. Please stay under the daily dose limit of Advil. Please stop Celebrex.    Indication for chronic opioid: M25.5, R hip pain Medication and dose: Tramadol 50 mg BID PRN # pills per month: 30 Last UDS date: 09/05/22 Opioid Treatment Agreement signed (Y/N): Y Opioid Treatment Agreement last reviewed with patient:   09/05/22 NCCSRS/PDMP reviewed this encounter (include red flags): Yes    Management will include: Low Risk (<10 MME) UDS every 6-12 months NCCSR check every visit Follow up Q3M initially, Q6M once established      Hamstring tightness of right lower extremity Assessment & Plan: Failed PT, HEP w/ stretching Given + symptoms with SLR, suspect  partially due to spinal stenosis Neurosurgery pending in April     Other orders -     Gabapentin; Start with a half a tablet just before bed. After 1-2 weeks, if tolerating well can increase to a half tab three times per day.  Dispense: 45 tablet; Refill: 2 -     traMADol HCl; Take 1 tablet (50 mg total) by mouth every 12 (twelve) hours as needed for severe pain.  Dispense: 30 tablet; Refill: 0   Interval Hx:  - Therapies: She has 2 more weeks of post-op PT and says that will finish a 2 month course. Working to wean herself off the cane by the end of this month. She is leaving for [place] on April 5th for 3 weeks   - Follow ups: Had L hip replacement 12/13/22. They found a tear in her upper thigh, which was repaired as part of the surgery.    - Medications: Norco last refilled 12/17/22. 10 mg #42 by Donnetta Hail. Of note, refilled the same amount 2 days prior. She is unsure why this was done, but it was intended to be TID dosing so approximately one month. She states if she moves wrong or goes to pick up something sideways, she will hurt. She will take 3 advil dual if she needs it, but has been told not to do that by her  pcp.   Hip pain is much better, and her legs overall do not hurt as much. She says she still needs back surgery, but has been told she should not have it close to her hip replacement surgery. He will hold off on it unless she is in excrutiating pain, and only if she is at least 6 months post-op.     Pain Inventory Average Pain 4 Pain Right Now 5 My pain is intermittent and aching  In the last 24 hours, has pain interfered with the following? General activity 2 Relation with others 0 Enjoyment of life 3 What TIME of day is your pain at its worst? morning  and night Sleep (in general) Poor  Pain is worse with: bending, standing, and some activites Pain improves with: therapy/exercise and medication Relief from Meds: 8  Family History  Problem Relation Age of Onset    Arthritis Other    Hyperlipidemia Other    Heart disease Other    Stroke Other    Hypertension Other    Social History   Socioeconomic History   Marital status: Widowed    Spouse name: Not on file   Number of children: Not on file   Years of education: Not on file   Highest education level: Not on file  Occupational History   Not on file  Tobacco Use   Smoking status: Never   Smokeless tobacco: Never  Vaping Use   Vaping Use: Never used  Substance and Sexual Activity   Alcohol use: Yes    Alcohol/week: 1.0 standard drink of alcohol    Types: 1 Standard drinks or equivalent per week    Comment: Wine in the evening   Drug use: No   Sexual activity: Not on file  Other Topics Concern   Not on file  Social History Narrative   Not on file   Social Determinants of Health   Financial Resource Strain: Low Risk  (10/08/2021)   Overall Financial Resource Strain (CARDIA)    Difficulty of Paying Living Expenses: Not hard at all  Food Insecurity: No Food Insecurity (10/08/2021)   Hunger Vital Sign    Worried About Running Out of Food in the Last Year: Never true    Ran Out of Food in the Last Year: Never true  Transportation Needs: No Transportation Needs (10/08/2021)   PRAPARE - Hydrologist (Medical): No    Lack of Transportation (Non-Medical): No  Physical Activity: Inactive (10/08/2021)   Exercise Vital Sign    Days of Exercise per Week: 0 days    Minutes of Exercise per Session: 0 min  Stress: No Stress Concern Present (10/08/2021)   [facility]    Feeling of Stress : Only a little  Social Connections: Socially Isolated (10/08/2021)   Social Connection and Isolation Panel [NHANES]    Frequency of Communication with Friends and Family: More than three times a week    Frequency of Social Gatherings with Friends and Family: More than three times a week    Attends Religious  Services: Never    Marine scientist or Organizations: No    Attends Archivist Meetings: Never    Marital Status: Widowed   Past Surgical History:  Procedure Laterality Date   BILATERAL CARPAL TUNNEL RELEASE     BLEPHAROPLASTY     BREAST BIOPSY     unsure which breast   CESAREAN SECTION  FOOT SURGERY Right    mini face lift     TOTAL SHOULDER ARTHROPLASTY Right    [facility]   TRIGGER FINGER RELEASE     tummy tuck     Past Surgical History:  Procedure Laterality Date   BILATERAL CARPAL TUNNEL RELEASE     BLEPHAROPLASTY     BREAST BIOPSY     unsure which breast   CESAREAN SECTION     FOOT SURGERY Right    mini face lift     TOTAL SHOULDER ARTHROPLASTY Right    [facility]   TRIGGER FINGER RELEASE     tummy tuck     Past Medical History:  Diagnosis Date   Allergy    Anxiety    Depression    Genital herpes    H/O cesarean section    Hypertension    Rotator cuff tear    Scoliosis    BP (!) 143/73   Pulse 81   Ht 5' (1.524 m)   Wt 147 lb (66.7 kg)   SpO2 99%   BMI 28.71 kg/m   Opioid Risk Score:   Fall Risk Score:  `1  Depression screen [facility] 2/9     10/05/2022   11:28 AM 09/05/2022   10:45 AM 10/08/2021    2:40 PM 04/06/2021    5:53 PM 05/07/2020    8:08 AM 04/21/2020   10:26 AM 10/29/2019   11:31 AM  Depression screen PHQ 2/9  Decreased Interest 0 0 0 0 0 1 0  Down, Depressed, Hopeless 0 0 0 1 0 0 0  PHQ - 2 Score 0 0 0 1 0 1 0  Altered sleeping  1  2     Tired, decreased energy  1  2     Change in appetite  0  1     Feeling bad or failure about yourself   0  1     Trouble concentrating  0  0     Moving slowly or fidgety/restless  0  0     Suicidal thoughts  0  0     PHQ-9 Score  2  7        Review of Systems  Musculoskeletal:  Positive for back pain.  All other systems reviewed and are negative.      Objective:   Physical Exam   PE: Constitution: Appropriate appearance for age. No apparent distress Resp: No  respiratory distress. No accessory muscle usage. on RA Cardio: Well perfused appearance. No peripheral edema. Abdomen: Nondistended. Nontender.   Psych: Appropriate mood and affect. Neuro: AAOx4. No apparent cognitive deficits   Neurologic Exam:   DTRs: Reflexes were 2+ in bilateral achilles, patella, biceps, BR and triceps. Babinsky: flexor responses b/l.   Hoffmans: negative b/l Sensory exam: revealed normal sensation in all dermatomal regions in bilateral lower extremities Motor exam: strength 5/5 throughout bilateral lower extremities Coordination: Fine motor coordination was normal.   Gait: normal  MSK: +R lateral hamstring tightness. Good AROM R hip flexion, extension; knee flexion, extension.    Facet loading: + low back pain R>L   TTP at paraspinals: Mild, bilateral    Assessment & Plan:   Erin Acosta is a 72 y.o. year old female  who  has a past medical history of Allergy, Anxiety, Depression, Genital herpes, H/O cesarean section, Hypertension, Rotator cuff tear, Scoliosis, and Osteopenia (dexa T -2.3 11/02/21) presenting to PM&R clinic for follow up of back and R hip pain.  Chronic pain syndrome Assessment & Plan: Indication for chronic opioid: Chronic low back and R hip pain Medication and dose: Norco 10 mg 1 am /1 qnoon /0.5-1 qhs tab daily # pills per month: 90 (#270 for 3 month supply given [place] at next script due) Last UDS date: 11/02/22 Opioid Treatment Agreement signed (Y/N): 09/05/22 Opioid Treatment Agreement last reviewed with patient:   NCCSRS/PDMP reviewed this encounter (include red flags): Yes   Management will include:  Medium Risk (10-90 MME) UDS every 3-6 months NCCSR check every visit Follow up Q3M    Encounter for long-term use of opiate analgesic  Encounter for medication monitoring  Chronic right hip pain Assessment & Plan: S/p THR R 12/13/22 with Dr. Kalman Shan  Keep up with your post-op PT  Follow up with your  surgeons as needed    Right lumbar radiculitis Assessment & Plan: I have refilled your medications for 3 months. Follow up in 3 months.    Other orders -     HYDROcodone-Acetaminophen; Take 1 tablet by mouth 3 (three) times daily as needed. One tablet in the morning,  one tablet in the afternoon and may take 1/2- 1 tablet at bedtime.  Dispense: 270 tablet; Refill: 0

## 2023-02-15 ENCOUNTER — Other Ambulatory Visit: Payer: Self-pay | Admitting: Family Medicine

## 2023-02-15 DIAGNOSIS — F411 Generalized anxiety disorder: Secondary | ICD-10-CM

## 2023-02-15 MED ORDER — CLONAZEPAM 1 MG PO TABS: 1.0000 mg | ORAL_TABLET | Freq: Two times a day (BID) | ORAL | 1 refills | Status: DC | PRN

## 2023-02-15 NOTE — Telephone Encounter (Signed)
Pt is requesting a renewal on clonazePAM (KLONOPIN) 1 MG tablet.   18 say supply [place], [place] [address], [place] [address] Phone: [phone]  Fax: [phone]

## 2023-02-15 NOTE — Telephone Encounter (Signed)
Requesting: Klonopin Contract: 08/04/22 UDS: 08/04/2022 Last OV: 12/01/2022 Next OV: 03/23/23 Last Refill: 06/24/2022, #180--1 RF Database:   Please advise

## 2023-02-19 DIAGNOSIS — M5416 Radiculopathy, lumbar region: Secondary | ICD-10-CM | POA: Insufficient documentation

## 2023-02-19 DIAGNOSIS — G894 Chronic pain syndrome: Secondary | ICD-10-CM | POA: Insufficient documentation

## 2023-02-19 NOTE — Assessment & Plan Note (Signed)
S/p THR R 12/13/22 with Dr. Kalman Shan  Keep up with your post-op PT  Follow up with your surgeons as needed

## 2023-02-19 NOTE — Assessment & Plan Note (Signed)
Indication for chronic opioid: Chronic low back and R hip pain Medication and dose: Norco 10 mg 1 am /1 qnoon /0.5-1 qhs tab daily # pills per month: 90 (#270 for 3 month supply given will be [place] at next script due) Last UDS date: 11/02/22 Opioid Treatment Agreement signed (Y/N): 09/05/22 Opioid Treatment Agreement last reviewed with patient:   NCCSRS/PDMP reviewed this encounter (include red flags): Yes   Management will include:  Medium Risk (10-90 MME) UDS every 3-6 months NCCSR check every visit Follow up Q3M

## 2023-02-19 NOTE — Assessment & Plan Note (Signed)
I have refilled your medications for 3 months. Follow up in 3 months.

## 2023-02-21 ENCOUNTER — Telehealth: Payer: Self-pay | Admitting: Family Medicine

## 2023-02-21 NOTE — Telephone Encounter (Signed)
Pharmacy stated there is a potential drug interaction with the klonopin and hydrocodone. [facility] direct number is [phone] and ref # is [id number]

## 2023-02-22 NOTE — Telephone Encounter (Signed)
Spoke with pharmacist. Explained that Etter Sjogren is not prescribing the Norco but patient did have a hip replacement recently. Pharmacy was concerned about recent increase of tablets.

## 2023-02-24 ENCOUNTER — Telehealth: Payer: Self-pay | Admitting: Family Medicine

## 2023-02-24 NOTE — Telephone Encounter (Signed)
Contacted Erin Acosta to schedule their annual wellness visit. Appointment made for 03/23/2023.  Erin Acosta; Care Guide Ambulatory Clinical Support [place] [facility] Direct Dial: [phone]

## 2023-03-01 ENCOUNTER — Other Ambulatory Visit: Payer: Self-pay | Admitting: Family Medicine

## 2023-03-22 ENCOUNTER — Encounter: Payer: Medicare Other | Admitting: Physical Medicine and Rehabilitation

## 2023-03-23 ENCOUNTER — Ambulatory Visit (INDEPENDENT_AMBULATORY_CARE_PROVIDER_SITE_OTHER): Payer: Medicare Other | Admitting: Family Medicine

## 2023-03-23 ENCOUNTER — Encounter: Payer: Self-pay | Admitting: Family Medicine

## 2023-03-23 VITALS — BP 132/80 | HR 69 | Temp 97.9°F | Resp 18 | Ht 60.0 in | Wt 145.8 lb

## 2023-03-23 DIAGNOSIS — E785 Hyperlipidemia, unspecified: Secondary | ICD-10-CM

## 2023-03-23 DIAGNOSIS — M48061 Spinal stenosis, lumbar region without neurogenic claudication: Secondary | ICD-10-CM | POA: Diagnosis not present

## 2023-03-23 DIAGNOSIS — J302 Other seasonal allergic rhinitis: Secondary | ICD-10-CM

## 2023-03-23 DIAGNOSIS — L709 Acne, unspecified: Secondary | ICD-10-CM

## 2023-03-23 DIAGNOSIS — F329 Major depressive disorder, single episode, unspecified: Secondary | ICD-10-CM

## 2023-03-23 DIAGNOSIS — G894 Chronic pain syndrome: Secondary | ICD-10-CM | POA: Diagnosis not present

## 2023-03-23 DIAGNOSIS — F411 Generalized anxiety disorder: Secondary | ICD-10-CM | POA: Diagnosis not present

## 2023-03-23 DIAGNOSIS — E119 Type 2 diabetes mellitus without complications: Secondary | ICD-10-CM

## 2023-03-23 DIAGNOSIS — I10 Essential (primary) hypertension: Secondary | ICD-10-CM | POA: Diagnosis not present

## 2023-03-23 DIAGNOSIS — F418 Other specified anxiety disorders: Secondary | ICD-10-CM | POA: Diagnosis not present

## 2023-03-23 LAB — COMPREHENSIVE METABOLIC PANEL
ALT: 15 U/L (ref 0–35)
AST: 19 U/L (ref 0–37)
Albumin: 4.2 g/dL (ref 3.5–5.2)
Alkaline Phosphatase: 79 U/L (ref 39–117)
BUN: 15 mg/dL (ref 6–23)
CO2: 27 mEq/L (ref 19–32)
Calcium: 9.3 mg/dL (ref 8.4–10.5)
Chloride: 104 mEq/L (ref 96–112)
Creatinine, Ser: 0.58 mg/dL (ref 0.40–1.20)
GFR: 90.61 mL/min (ref >60.00)
Glucose, Bld: 77 mg/dL (ref 70–99)
Potassium: 4.7 mEq/L (ref 3.5–5.1)
Sodium: 138 mEq/L (ref 135–145)
Total Bilirubin: 0.6 mg/dL (ref 0.2–1.2)
Total Protein: 6.4 g/dL (ref 6.0–8.3)

## 2023-03-23 LAB — MICROALBUMIN / CREATININE URINE RATIO
Creatinine,U: 118.8 mg/dL
Microalb Creat Ratio: 2.2 mg/g (ref 0.0–30.0)
Microalb, Ur: 2.6 mg/dL — ABNORMAL HIGH (ref 0.0–1.9)

## 2023-03-23 LAB — LIPID PANEL
Cholesterol: 124 mg/dL (ref 0–200)
HDL: 57 mg/dL (ref >39.00)
LDL Cholesterol: 51 mg/dL (ref 0–99)
NonHDL: 66.71
Total CHOL/HDL Ratio: 2
Triglycerides: 81 mg/dL (ref 0.0–149.0)
VLDL: 16.2 mg/dL (ref 0.0–40.0)

## 2023-03-23 LAB — CBC WITH DIFFERENTIAL/PLATELET
Basophils Absolute: 0.1 10*3/uL (ref 0.0–0.1)
Basophils Relative: 0.8 % (ref 0.0–3.0)
Eosinophils Absolute: 0.2 10*3/uL (ref 0.0–0.7)
Eosinophils Relative: 2.6 % (ref 0.0–5.0)
HCT: 45.3 % (ref 36.0–46.0)
Hemoglobin: 14.9 g/dL (ref 12.0–15.0)
Lymphocytes Relative: 22.5 % (ref 12.0–46.0)
Lymphs Abs: 1.5 10*3/uL (ref 0.7–4.0)
MCHC: 32.9 g/dL (ref 30.0–36.0)
MCV: 86.1 fl (ref 78.0–100.0)
Monocytes Absolute: 0.7 10*3/uL (ref 0.1–1.0)
Monocytes Relative: 10.7 % (ref 3.0–12.0)
Neutro Abs: 4.4 10*3/uL (ref 1.4–7.7)
Neutrophils Relative %: 63.4 % (ref 43.0–77.0)
Platelets: 245 10*3/uL (ref 150.0–400.0)
RBC: 5.26 Mil/uL — ABNORMAL HIGH (ref 3.87–5.11)
RDW: 15.8 % — ABNORMAL HIGH (ref 11.5–15.5)
WBC: 6.9 10*3/uL (ref 4.0–10.5)

## 2023-03-23 LAB — HEMOGLOBIN A1C: Hgb A1c MFr Bld: 5.8 % (ref 4.6–6.5)

## 2023-03-23 MED ORDER — ARIPIPRAZOLE 2 MG PO TABS
2.0000 mg | ORAL_TABLET | Freq: Every day | ORAL | 2 refills | Status: DC
Start: 2023-03-23 — End: 2024-04-29

## 2023-03-23 NOTE — Assessment & Plan Note (Signed)
Con't lexapro and add abilify 2 mg daily  Pt should know in 2 weeks whether it is working or not

## 2023-03-23 NOTE — Assessment & Plan Note (Signed)
Per pain management.  

## 2023-03-23 NOTE — Assessment & Plan Note (Signed)
Pt is waiting to have surgery

## 2023-03-23 NOTE — Assessment & Plan Note (Signed)
Well controlled, no changes to meds. Encouraged heart healthy diet such as the DASH diet and exercise as tolerated.  °

## 2023-03-23 NOTE — Assessment & Plan Note (Signed)
Tolerating statin, encouraged heart healthy diet, avoid trans fats, minimize simple carbs and saturated fats. Increase exercise as tolerated 

## 2023-03-23 NOTE — Assessment & Plan Note (Signed)
stable °

## 2023-03-23 NOTE — Progress Notes (Signed)
Subjective:   By signing my name below, I, Erin Acosta, attest that this documentation has been prepared under the direction and in the presence of [name], DO. 03/23/2023   Patient ID: Erin Acosta, female    DOB: Dec 06, 1950, 72 y.o.   MRN: [id number]  Chief Complaint  Patient presents with   Hyperlipidemia   Follow-up    Hyperlipidemia Pertinent negatives include no chest pain or shortness of breath.   Patient is in today for a follow up visit.   She reports recovering well from her hip replacement procedure. She reports developed depression and anxiety following her recovery in the hospital due having to spend recovery alone. Her daughter was taking care of her initially but had to return to work. She continues following up with a counselor regularly. She is requesting to find another counselor closer to her. She is requesting to take a stronger dose of her depression medication.  She is planning on receiving back surgery. Her surgeon wanted to do the procedure 6 months following her hip procedure. She continues having back pain and is taking hydrocodone as needed to manage her pain.  She reports having a fall on her recent trip. She was accidentally pushed backwards causing her trip.  She denies swelling in her ankles.  Her allergies are stable at this time.   Past Medical History:  Diagnosis Date   Allergy    Anxiety    Depression    Genital herpes    H/O cesarean section    Hypertension    Rotator cuff tear    Scoliosis     Past Surgical History:  Procedure Laterality Date   BILATERAL CARPAL TUNNEL RELEASE     BLEPHAROPLASTY     BREAST BIOPSY     unsure which breast   CESAREAN SECTION     FOOT SURGERY Right    mini face lift     TOTAL SHOULDER ARTHROPLASTY Right    [facility]   TRIGGER FINGER RELEASE     tummy tuck      Family History  Problem Relation Age of Onset   Arthritis Other    Hyperlipidemia Other    Heart disease Other     Stroke Other    Hypertension Other     Social History   Socioeconomic History   Marital status: Widowed    Spouse name: Not on file   Number of children: Not on file   Years of education: Not on file   Highest education level: Not on file  Occupational History   Not on file  Tobacco Use   Smoking status: Never   Smokeless tobacco: Never  Vaping Use   Vaping Use: Never used  Substance and Sexual Activity   Alcohol use: Yes    Alcohol/week: 1.0 standard drink of alcohol    Types: 1 Standard drinks or equivalent per week    Comment: Wine in the evening   Drug use: No   Sexual activity: Not on file  Other Topics Concern   Not on file  Social History Narrative   Not on file   Social Determinants of Health   Financial Resource Strain: Low Risk  (10/08/2021)   Overall Financial Resource Strain (CARDIA)    Difficulty of Paying Living Expenses: Not hard at all  Food Insecurity: No Food Insecurity (10/08/2021)   Hunger Vital Sign    Worried About Running Out of Food in the Last Year: Never true  Ran Out of Food in the Last Year: Never true  Transportation Needs: No Transportation Needs (10/08/2021)   PRAPARE - Administrator, Civil Service (Medical): No    Lack of Transportation (Non-Medical): No  Physical Activity: Inactive (10/08/2021)   Exercise Vital Sign    Days of Exercise per Week: 0 days    Minutes of Exercise per Session: 0 min  Stress: No Stress Concern Present (10/08/2021)   [employer] of Occupational Health - Occupational Stress Questionnaire    Feeling of Stress : Only a little  Social Connections: Socially Isolated (10/08/2021)   Social Connection and Isolation Panel [NHANES]    Frequency of Communication with Friends and Family: More than three times a week    Frequency of Social Gatherings with Friends and Family: More than three times a week    Attends Religious Services: Never    Database administrator or Organizations: No     Attends Banker Meetings: Never    Marital Status: Widowed  Intimate Partner Violence: Not At Risk (10/08/2021)   Humiliation, Afraid, Rape, and Kick questionnaire    Fear of Current or Ex-Partner: No    Emotionally Abused: No    Physically Abused: No    Sexually Abused: No    Outpatient Medications Prior to Visit  Medication Sig Dispense Refill   aspirin EC 81 MG tablet Take 81 mg by mouth daily.     atorvastatin (LIPITOR) 40 MG tablet TAKE 1 TABLET DAILY 90 tablet 1   cetirizine (ZYRTEC) 10 MG tablet TAKE 1 TABLET DAILY 90 tablet 3   clonazePAM (KLONOPIN) 1 MG tablet Take 1 tablet (1 mg total) by mouth 2 (two) times daily as needed. 180 tablet 1   escitalopram (LEXAPRO) 20 MG tablet TAKE 1 TABLET DAILY 90 tablet 1   gentamicin cream (GARAMYCIN) 0.1 % Apply 1 Application topically 3 (three) times daily. 15 g 0   HYDROcodone-acetaminophen (NORCO) 10-325 MG tablet Take 1 tablet by mouth 3 (three) times daily as needed. One tablet in the morning,  one tablet in the afternoon and may take 1/2- 1 tablet at bedtime. 270 tablet 0   mirabegron ER (MYRBETRIQ) 50 MG TB24 tablet TAKE 1 TABLET DAILY 90 tablet 1   olopatadine (PATADAY) 0.1 % ophthalmic solution Place 1 drop into both eyes 2 (two) times daily. 5 mL 12   Probiotic Product (PROBIOTIC DAILY PO) Take by mouth.     tretinoin (RETIN-A) 0.025 % cream Apply topically at bedtime. 45 g 0   valACYclovir (VALTREX) 500 MG tablet TAKE 1 TABLET DAILY 90 tablet 3   No facility-administered medications prior to visit.    Allergies  Allergen Reactions   Oxycodone-Acetaminophen Anaphylaxis and Itching    Headache, delusion   Nickel Other (See Comments)    Redness and pain    Review of Systems  Constitutional:  Negative for fever and malaise/fatigue.  HENT:  Negative for congestion.   Eyes:  Negative for blurred vision.  Respiratory:  Negative for cough and shortness of breath.   Cardiovascular:  Negative for chest pain,  palpitations and leg swelling.  Gastrointestinal:  Negative for vomiting.  Musculoskeletal:  Negative for back pain.  Skin:  Negative for rash.  Neurological:  Negative for loss of consciousness and headaches.  Psychiatric/Behavioral:  Positive for depression. The patient is nervous/anxious.        Objective:    Physical Exam Vitals and nursing note reviewed.  Constitutional:  General: She is not in acute distress.    Appearance: Normal appearance. She is not ill-appearing.  HENT:     Head: Normocephalic and atraumatic.     Right Ear: External ear normal.     Left Ear: External ear normal.  Eyes:     Extraocular Movements: Extraocular movements intact.     Pupils: Pupils are equal, round, and reactive to light.  Cardiovascular:     Rate and Rhythm: Normal rate and regular rhythm.     Heart sounds: Normal heart sounds. No murmur heard.    No gallop.  Pulmonary:     Effort: Pulmonary effort is normal. No respiratory distress.     Breath sounds: Normal breath sounds. No wheezing or rales.  Skin:    General: Skin is warm and dry.  Neurological:     General: No focal deficit present.     Mental Status: She is alert and oriented to person, place, and time.  Psychiatric:        Mood and Affect: Mood is anxious and depressed. Affect is not tearful.        Speech: Speech is rapid and pressured.        Behavior: Behavior normal. Behavior is cooperative.        Thought Content: Thought content normal.        Cognition and Memory: Cognition and memory normal.        Judgment: Judgment normal.     BP 132/80 (BP Location: Right Arm, Patient Position: Sitting, Cuff Size: Normal)   Pulse 69   Temp 97.9 F (36.6 C) (Oral)   Resp 18   Ht 5' (1.524 m)   Wt 145 lb 12.8 oz (66.1 kg)   SpO2 96%   BMI 28.47 kg/m  Wt Readings from Last 3 Encounters:  03/23/23 145 lb 12.8 oz (66.1 kg)  02/13/23 147 lb (66.7 kg)  12/01/22 148 lb 6.4 oz (67.3 kg)       Assessment & Plan:   Hyperlipidemia, unspecified hyperlipidemia type Assessment & Plan: .Tolerating statin, encouraged heart healthy diet, avoid trans fats, minimize simple carbs and saturated fats. Increase exercise as tolerated   Orders: -     Lipid panel -     CBC with Differential/Platelet -     Comprehensive metabolic panel  Adult acne  Generalized anxiety disorder Assessment & Plan: stable   Seasonal allergies  Diet-controlled diabetes mellitus (HCC) -     Lipid panel -     CBC with Differential/Platelet -     Comprehensive metabolic panel -     Hemoglobin A1c -     Microalbumin / creatinine urine ratio  Essential hypertension Assessment & Plan: Well controlled, no changes to meds. Encouraged heart healthy diet such as the DASH diet and exercise as tolerated.     Depression with anxiety Assessment & Plan: Con't lexapro and add abilify 2 mg daily  Pt should know in 2 weeks whether it is working or not    Treatment-resistant depression -     ARIPiprazole; Take 1 tablet (2 mg total) by mouth daily.  Dispense: 30 tablet; Refill: 2  Spinal stenosis of lumbar region, unspecified whether neurogenic claudication present Assessment & Plan: Pt is waiting to have surgery    Chronic pain syndrome Assessment & Plan: Per pain management      I, [name], DO, personally preformed the services described in this documentation.  All medical record entries made by the scribe were at  my direction and in my presence.  I have reviewed the chart and discharge instructions (if applicable) and agree that the record reflects my personal performance and is accurate and complete. 03/23/2023   I,Erin Acosta,acting as a scribe for [name], DO.,have documented all relevant documentation on the behalf of [name], DO,as directed by  [name], DO while in the presence of [name], DO.   [name], DO

## 2023-03-23 NOTE — Patient Instructions (Signed)

## 2023-04-07 DIAGNOSIS — E7800 Pure hypercholesterolemia, unspecified: Secondary | ICD-10-CM | POA: Diagnosis not present

## 2023-04-07 DIAGNOSIS — Z79899 Other long term (current) drug therapy: Secondary | ICD-10-CM | POA: Diagnosis not present

## 2023-04-07 DIAGNOSIS — I701 Atherosclerosis of renal artery: Secondary | ICD-10-CM | POA: Diagnosis not present

## 2023-04-07 DIAGNOSIS — I700 Atherosclerosis of aorta: Secondary | ICD-10-CM | POA: Diagnosis not present

## 2023-04-07 DIAGNOSIS — I2510 Atherosclerotic heart disease of native coronary artery without angina pectoris: Secondary | ICD-10-CM | POA: Diagnosis not present

## 2023-04-07 DIAGNOSIS — R0789 Other chest pain: Secondary | ICD-10-CM | POA: Diagnosis not present

## 2023-04-07 DIAGNOSIS — Z955 Presence of coronary angioplasty implant and graft: Secondary | ICD-10-CM | POA: Diagnosis not present

## 2023-04-07 DIAGNOSIS — R079 Chest pain, unspecified: Secondary | ICD-10-CM | POA: Diagnosis not present

## 2023-04-07 DIAGNOSIS — I10 Essential (primary) hypertension: Secondary | ICD-10-CM | POA: Diagnosis not present

## 2023-04-07 LAB — LAB REPORT - SCANNED: EGFR: 90

## 2023-04-10 DIAGNOSIS — R079 Chest pain, unspecified: Secondary | ICD-10-CM | POA: Diagnosis not present

## 2023-04-24 ENCOUNTER — Telehealth: Payer: Self-pay | Admitting: Family Medicine

## 2023-04-24 NOTE — Telephone Encounter (Signed)
Copied from CRM [id number]. Topic: Medicare AWV >> Apr 24, 2023  1:43 PM Payton Doughty wrote: Reason for CRM: LM 04/24/2023 to schedule AWV   Verlee Rossetti; Care Guide Ambulatory Clinical Support [place] [facility] Direct Dial: [phone]

## 2023-05-02 ENCOUNTER — Other Ambulatory Visit: Payer: Self-pay | Admitting: *Deleted

## 2023-05-02 MED ORDER — ATORVASTATIN CALCIUM 40 MG PO TABS: ORAL_TABLET | ORAL | 1 refills | Status: DC

## 2023-05-17 ENCOUNTER — Encounter
Payer: Medicare Other | Attending: Physical Medicine and Rehabilitation | Admitting: Physical Medicine and Rehabilitation

## 2023-05-17 ENCOUNTER — Encounter: Payer: Self-pay | Admitting: Physical Medicine and Rehabilitation

## 2023-05-17 VITALS — BP 163/83 | HR 69 | Ht 60.0 in | Wt 145.2 lb

## 2023-05-17 DIAGNOSIS — Z79891 Long term (current) use of opiate analgesic: Secondary | ICD-10-CM | POA: Diagnosis not present

## 2023-05-17 DIAGNOSIS — G894 Chronic pain syndrome: Secondary | ICD-10-CM | POA: Diagnosis not present

## 2023-05-17 DIAGNOSIS — M25551 Pain in right hip: Secondary | ICD-10-CM | POA: Diagnosis not present

## 2023-05-17 DIAGNOSIS — M5432 Sciatica, left side: Secondary | ICD-10-CM

## 2023-05-17 DIAGNOSIS — M5431 Sciatica, right side: Secondary | ICD-10-CM

## 2023-05-17 DIAGNOSIS — G8929 Other chronic pain: Secondary | ICD-10-CM | POA: Diagnosis not present

## 2023-05-17 DIAGNOSIS — Z5181 Encounter for therapeutic drug level monitoring: Secondary | ICD-10-CM

## 2023-05-17 MED ORDER — HYDROCODONE-ACETAMINOPHEN 10-325 MG PO TABS: 1.0000 | ORAL_TABLET | Freq: Three times a day (TID) | ORAL | 0 refills | Status: DC | PRN

## 2023-05-17 MED ORDER — GABAPENTIN 300 MG PO CAPS: 300.0000 mg | ORAL_CAPSULE | Freq: Three times a day (TID) | ORAL | 0 refills | Status: DC

## 2023-05-17 MED ORDER — HYDROCODONE-ACETAMINOPHEN 10-325 MG PO TABS: 1.0000 | ORAL_TABLET | Freq: Three times a day (TID) | ORAL | 0 refills | Status: AC | PRN

## 2023-05-17 MED ORDER — GABAPENTIN 300 MG PO CAPS: 300.0000 mg | ORAL_CAPSULE | Freq: Every day | ORAL | 0 refills | Status: DC

## 2023-05-17 NOTE — Patient Instructions (Signed)
Chronic pain syndrome Encounter for medication monitoring Encounter for long-term use of opiate analgesic Today, we discussed the increasing pain in your legs and you requested a dose escalation of your Norco.  I do not feel this is appropriate at this time.  We did discuss switching to similar morphine milliequivalents of oxycodone versus transitioning to a Butrans patch, neither of which seem appropriate after discussing risks and benefits.  I refilled your Norco 10 mg 3 times daily as needed for 3 months starting 6/28, follow-up with me in 3 months.     Chronic right hip pain Bilateral sciatica I am starting you on a medication called gabapentin.  You will take one 300 mg capsule at nighttime for 1 week, then if no side effects can increase to 1 capsule 3 times daily.  Call me in 2 weeks to let me know how this medication is going.

## 2023-05-17 NOTE — Progress Notes (Signed)
Subjective:    Patient ID: Erin Acosta, female    DOB: [birth year], 72 y.o.   MRN: [id number]  HPI   Erin Acosta is a 72 y.o. year old female  who  has a past medical history of Allergy, Anxiety, Depression, Genital herpes, H/O cesarean section, Hypertension, Rotator cuff tear, and Scoliosis.   presenting to PM&R clinic for follow up of back and R hip pain.   Plan from last visit:  Chronic pain syndrome Assessment & Plan: Indication for chronic opioid: Chronic low back and R hip pain Medication and dose: Norco 10 mg 1 am /1 qnoon /0.5-1 qhs tab daily # pills per month: 90 (#270 for 3 month supply given [place] due) Last UDS date: 11/02/22 Opioid Treatment Agreement signed (Y/N): 09/05/22 Opioid Treatment Agreement last reviewed with patient:   NCCSRS/PDMP reviewed this encounter (include red flags): Yes    Management will include:   Medium Risk (10-90 MME) UDS every 3-6 months NCCSR check every visit Follow up Q3M      Encounter for long-term use of opiate analgesic   Encounter for medication monitoring   Chronic right hip pain Assessment & Plan: S/p THR R 12/13/22 with Dr. Renaee Munda   Keep up with your post-op PT   Follow up with your surgeons as needed       Right lumbar radiculitis Assessment & Plan: I have refilled your medications for 3 months. Follow up in 3 months.    Interval Hx:  - Therapies: Finished PT after THR. Feels overall her pain is a bit worse.  None currently; will be going to rehab after back surgery in 6 months for a short time.    - Follow ups: She had a tear with her R hip replacement.  Has back surgery schedule for 6 months out, getting it done with Dr. Jiles Prows, not sure what levels or what procedure.  - is refinishing/painting pots and trying to do yardwork.    - Medications: No major changes; last refill 02/16/23. Now taking hydrocodone 1.5 tablets 3x daily to keep her pain under control (only has 1  tab TID ordered). Feels the medication both does not last long enough and does not do enough; she   - Other concerns: Sister is in poor health; has been having her stressed out. Sister has 2 autoimmune diseases and blood clots.   Pain Inventory Average Pain 8 Pain Right Now 8 My pain is sharp and aching  In the last 24 hours, has pain interfered with the following? General activity 7 Relation with others 5 Enjoyment of life 0 What TIME of day is your pain at its worst? morning  Sleep (in general) Fair  Pain is worse with: walking, bending, standing, and some activites Pain improves with: pacing activities Relief from Meds: 5  Family History  Problem Relation Age of Onset   Arthritis Other    Hyperlipidemia Other    Heart disease Other    Stroke Other    Hypertension Other    Social History   Socioeconomic History   Marital status: Widowed    Spouse name: Not on file   Number of children: Not on file   Years of education: Not on file   Highest education level: Not on file  Occupational History   Not on file  Tobacco Use   Smoking status: Never   Smokeless tobacco: Never  Vaping Use   Vaping Use: Never used  Substance and Sexual Activity   Alcohol use: Yes    Alcohol/week: 1.0 standard drink of alcohol    Types: 1 Standard drinks or equivalent per week    Comment: Wine in the evening   Drug use: No   Sexual activity: Not on file  Other Topics Concern   Not on file  Social History Narrative   Not on file   Social Determinants of Health   Financial Resource Strain: Low Risk  (10/08/2021)   Overall Financial Resource Strain (CARDIA)    Difficulty of Paying Living Expenses: Not hard at all  Food Insecurity: No Food Insecurity (10/08/2021)   Hunger Vital Sign    Worried About Running Out of Food in the Last Year: Never true    Ran Out of Food in the Last Year: Never true  Transportation Needs: No Transportation Needs (10/08/2021)   PRAPARE - Therapist, art (Medical): No    Lack of Transportation (Non-Medical): No  Physical Activity: Inactive (10/08/2021)   Exercise Vital Sign    Days of Exercise per Week: 0 days    Minutes of Exercise per Session: 0 min  Stress: No Stress Concern Present (10/08/2021)   [employer] of Occupational Health - Occupational Stress Questionnaire    Feeling of Stress : Only a little  Social Connections: Socially Isolated (10/08/2021)   Social Connection and Isolation Panel [NHANES]    Frequency of Communication with Friends and Family: More than three times a week    Frequency of Social Gatherings with Friends and Family: More than three times a week    Attends Religious Services: Never    Database administrator or Organizations: No    Attends Banker Meetings: Never    Marital Status: Widowed   Past Surgical History:  Procedure Laterality Date   BILATERAL CARPAL TUNNEL RELEASE     BLEPHAROPLASTY     BREAST BIOPSY     unsure which breast   CESAREAN SECTION     FOOT SURGERY Right    mini face lift     TOTAL SHOULDER ARTHROPLASTY Right    [facility]   TRIGGER FINGER RELEASE     tummy tuck     Past Surgical History:  Procedure Laterality Date   BILATERAL CARPAL TUNNEL RELEASE     BLEPHAROPLASTY     BREAST BIOPSY     unsure which breast   CESAREAN SECTION     FOOT SURGERY Right    mini face lift     TOTAL SHOULDER ARTHROPLASTY Right    [facility]   TRIGGER FINGER RELEASE     tummy tuck     Past Medical History:  Diagnosis Date   Allergy    Anxiety    Depression    Genital herpes    H/O cesarean section    Hypertension    Rotator cuff tear    Scoliosis    BP (!) 163/83   Pulse 69   Ht 5' (1.524 m)   Wt 145 lb 3.2 oz (65.9 kg)   SpO2 97%   BMI 28.36 kg/m   Opioid Risk Score:   Fall Risk Score:  `1  Depression screen [facility] 2/9     05/17/2023   11:25 AM 02/13/2023    2:52 PM 10/05/2022   11:28 AM 09/05/2022   10:45 AM 10/08/2021     2:40 PM 04/06/2021    5:53 PM 05/07/2020    8:08 AM  Depression  screen PHQ 2/9  Decreased Interest 1 0 0 0 0 0 0  Down, Depressed, Hopeless 1 0 0 0 0 1 0  PHQ - 2 Score 2 0 0 0 0 1 0  Altered sleeping    1  2   Tired, decreased energy    1  2   Change in appetite    0  1   Feeling bad or failure about yourself     0  1   Trouble concentrating    0  0   Moving slowly or fidgety/restless    0  0   Suicidal thoughts    0  0   PHQ-9 Score    2  7      Review of Systems  Constitutional: Negative.   HENT: Negative.    Eyes: Negative.   Respiratory: Negative.    Cardiovascular: Negative.   Gastrointestinal: Negative.   Endocrine: Negative.   Genitourinary: Negative.   Musculoskeletal:  Positive for back pain.  Skin: Negative.   Allergic/Immunologic: Negative.   Neurological: Negative.   Hematological: Negative.   Psychiatric/Behavioral:  Positive for dysphoric mood.   All other systems reviewed and are negative.      Objective:   Physical Exam  Constitution: Appropriate appearance for age. No apparent distress Resp: No respiratory distress. No accessory muscle usage. on RA Cardio: Well perfused appearance. No peripheral edema. Abdomen: Nondistended. Nontender.   Psych: Appropriate mood and affect. Neuro: AAOx4. No apparent cognitive deficits    Neurologic Exam:   Sensory exam: revealed normal sensation in all dermatomal regions in bilateral lower extremities Motor exam: strength 5/5 throughout bilateral lower extremities Coordination: Fine motor coordination was normal.   Gait: normal   MSK:  Good AROM b/l hip flexion, extension; knee flexion, extension. Remains with tight hamstrings.   Facet loading: + low back pain R>L  TTP at lumbar paraspinals: Mild, bilateral      Assessment & Plan:   MANYA CHALLA is a 72 y.o. year old female  who  has a past medical history of Allergy, Anxiety, Depression, Genital herpes, H/O cesarean section, Hypertension, Rotator cuff  tear, and Scoliosis.   presenting to PM&R clinic for follow up of back and R hip pain.   Chronic pain syndrome Encounter for medication monitoring Encounter for long-term use of opiate analgesic Today, we discussed the increasing pain in your legs and you requested a dose escalation of your Norco.  I do not feel this is appropriate at this time.  We did discuss switching to similar morphine milliequivalents of oxycodone versus transitioning to a Butrans patch, neither of which seem appropriate after discussing risks and benefits.  I refilled your Norco 10 mg 3 times daily as needed for 3 months today, follow-up with me in 3 months.  Indication for chronic opioid: Chronic low back and R hip pain Medication and dose: Norco 10 mg 1 am /1 qnoon /0.5-1 qhs tab daily # pills per month: 90 (#270 for 3 month supply [place]) Last UDS date: 11/02/22 Opioid Treatment Agreement signed (Y/N): 09/05/22 Opioid Treatment Agreement last reviewed with patient:   NCCSRS/PDMP reviewed this encounter (include red flags): Yes    Management will include:   Medium Risk (10-90 MME) UDS every 3-6 months NCCSR check every visit Follow up Q3M     Chronic right hip pain Bilateral sciatica I am starting you on a medication called gabapentin.  You will take  one 300 mg capsule at nighttime for 1 week, then if no side effects can increase to 1 capsule 3 times daily.  Call me in 2 weeks to let me know how this medication is going.

## 2023-05-18 ENCOUNTER — Telehealth: Payer: Self-pay

## 2023-05-18 NOTE — Telephone Encounter (Signed)
Hydrocodone 10-325 MG is currently out of stock. I called to confirm the cancellation. Rx was cancelled. Patient stated it was ready for pick up at the local pharmacy.

## 2023-05-19 ENCOUNTER — Ambulatory Visit (HOSPITAL_BASED_OUTPATIENT_CLINIC_OR_DEPARTMENT_OTHER)
Admission: RE | Admit: 2023-05-19 | Discharge: 2023-05-19 | Disposition: A | Discharge status: Home / Self Care | Payer: Medicare Other | Source: Ambulatory Visit | Attending: Family Medicine | Admitting: Family Medicine

## 2023-05-19 ENCOUNTER — Encounter: Payer: Self-pay | Admitting: Family Medicine

## 2023-05-19 ENCOUNTER — Ambulatory Visit: Payer: Medicare Other | Admitting: Family Medicine

## 2023-05-19 VITALS — BP 140/80 | HR 68 | Temp 97.8°F | Resp 18 | Ht 60.0 in | Wt 146.6 lb

## 2023-05-19 DIAGNOSIS — T402X5A Adverse effect of other opioids, initial encounter: Secondary | ICD-10-CM | POA: Insufficient documentation

## 2023-05-19 DIAGNOSIS — K5903 Drug induced constipation: Secondary | ICD-10-CM | POA: Diagnosis not present

## 2023-05-19 DIAGNOSIS — K5909 Other constipation: Secondary | ICD-10-CM | POA: Diagnosis not present

## 2023-05-19 DIAGNOSIS — R1031 Right lower quadrant pain: Secondary | ICD-10-CM | POA: Insufficient documentation

## 2023-05-19 LAB — COMPREHENSIVE METABOLIC PANEL
ALT: 14 U/L (ref 0–35)
AST: 15 U/L (ref 0–37)
Albumin: 4.3 g/dL (ref 3.5–5.2)
Alkaline Phosphatase: 85 U/L (ref 39–117)
BUN: 11 mg/dL (ref 6–23)
CO2: 28 mEq/L (ref 19–32)
Calcium: 9.9 mg/dL (ref 8.4–10.5)
Chloride: 106 mEq/L (ref 96–112)
Creatinine, Ser: 0.49 mg/dL (ref 0.40–1.20)
GFR: 94.27 mL/min (ref >60.00)
Glucose, Bld: 85 mg/dL (ref 70–99)
Potassium: 4.4 mEq/L (ref 3.5–5.1)
Sodium: 139 mEq/L (ref 135–145)
Total Bilirubin: 0.5 mg/dL (ref 0.2–1.2)
Total Protein: 6.5 g/dL (ref 6.0–8.3)

## 2023-05-19 LAB — CBC WITH DIFFERENTIAL/PLATELET
Basophils Absolute: 0.1 10*3/uL (ref 0.0–0.1)
Basophils Relative: 0.9 % (ref 0.0–3.0)
Eosinophils Absolute: 0.2 10*3/uL (ref 0.0–0.7)
Eosinophils Relative: 3 % (ref 0.0–5.0)
HCT: 43.8 % (ref 36.0–46.0)
Hemoglobin: 14.5 g/dL (ref 12.0–15.0)
Lymphocytes Relative: 22.1 % (ref 12.0–46.0)
Lymphs Abs: 1.6 10*3/uL (ref 0.7–4.0)
MCHC: 33 g/dL (ref 30.0–36.0)
MCV: 85.9 fl (ref 78.0–100.0)
Monocytes Absolute: 0.7 10*3/uL (ref 0.1–1.0)
Monocytes Relative: 9.9 % (ref 3.0–12.0)
Neutro Abs: 4.5 10*3/uL (ref 1.4–7.7)
Neutrophils Relative %: 64.1 % (ref 43.0–77.0)
Platelets: 242 10*3/uL (ref 150.0–400.0)
RBC: 5.1 Mil/uL (ref 3.87–5.11)
RDW: 15.1 % (ref 11.5–15.5)
WBC: 7 10*3/uL (ref 4.0–10.5)

## 2023-05-19 LAB — POC URINALSYSI DIPSTICK (AUTOMATED)
Bilirubin, UA: NEGATIVE
Blood, UA: NEGATIVE
Glucose, UA: NEGATIVE
Ketones, UA: NEGATIVE
Nitrite, UA: NEGATIVE
Protein, UA: NEGATIVE
Spec Grav, UA: <=1.005 — AB (ref 1.010–1.025)
Urobilinogen, UA: 0.2 E.U./dL
pH, UA: 6 (ref 5.0–8.0)

## 2023-05-19 MED ORDER — NITROGLYCERIN 0.4 MG SL SUBL: 0.4000 mg | SUBLINGUAL_TABLET | SUBLINGUAL | 3 refills | Status: AC | PRN

## 2023-05-19 NOTE — Assessment & Plan Note (Signed)
Miralax daily  Stool softener  Drink plenty of water

## 2023-05-19 NOTE — Assessment & Plan Note (Signed)
No pain today  ? From constipation  Xray abd  If pain returns over the weekend-- go to ER Check labs

## 2023-05-19 NOTE — Progress Notes (Signed)
Established Patient Office Visit  Subjective   Patient ID: Erin Acosta, female    DOB: 19-May-1951  Age: 72 y.o. MRN: [id number]  Chief Complaint  Patient presents with   Flank Pain    Right side pain, x1 month, pt states no urinary concerns. Pain close to hip replacement    HPI Discussed the use of AI scribe software for clinical note transcription with the patient, who gave verbal consent to proceed.  History of Present Illness   The patient, with a history of hip replacement and pending back surgery, presents with intermittent right-sided abdominal pain at the waist level for the past month. The pain is sometimes sharp and sometimes absent. The patient consulted with their orthopedic surgeon, who ruled out a connection with the recent hip surgery. The patient is currently on hydrocodone for back pain management, which causes constipation. They report having bowel movements, though sometimes they are difficult. The patient also mentions a recent episode of severe headache, neck pain, and back pain, which led to an ER visit. The ER visit ruled out a heart attack but revealed small blockages to the kidney and one of the intestines. The patient also has a heart condition and sees a cardiologist annually.      Patient Active Problem List   Diagnosis Date Noted   RLQ abdominal pain 05/19/2023   Constipation due to opioid therapy 05/19/2023   Encounter for long-term use of opiate analgesic 05/17/2023   Encounter for medication monitoring 05/17/2023   Chronic pain syndrome 02/19/2023   Right lumbar radiculitis 02/19/2023   Pre-op examination 12/04/2022   Bilateral sciatica 09/05/2022   Hamstring tightness of right lower extremity 09/05/2022   Chronic right hip pain 09/05/2022   Spinal stenosis of lumbar region 04/07/2022   Stress incontinence of urine 01/11/2022   Bronchitis 05/18/2021   Bruising 07/15/2020   Urinary incontinence 04/21/2020   Overweight (BMI 25.0-29.9) 12/12/2019    Allergies 11/05/2018   Depression with anxiety 08/24/2017   Hyperlipidemia 05/08/2017   Pulmonary nodules 08/25/2016   Generalized anxiety disorder 03/27/2016   Right rotator cuff tear 03/27/2016   Stenosis of subclavian artery (HCC) 03/10/2016   Disease of lipid metabolism 03/10/2016   Essential hypertension 03/10/2016   Chest pain 03/10/2016   Past Medical History:  Diagnosis Date   Allergy    Anxiety    Depression    Genital herpes    H/O cesarean section    Hypertension    Rotator cuff tear    Scoliosis    Past Surgical History:  Procedure Laterality Date   BILATERAL CARPAL TUNNEL RELEASE     BLEPHAROPLASTY     BREAST BIOPSY     unsure which breast   CESAREAN SECTION     FOOT SURGERY Right    mini face lift     TOTAL SHOULDER ARTHROPLASTY Right    [facility]   TRIGGER FINGER RELEASE     tummy tuck     Social History   Tobacco Use   Smoking status: Never   Smokeless tobacco: Never  Vaping Use   Vaping Use: Never used  Substance Use Topics   Alcohol use: Yes    Alcohol/week: 1.0 standard drink of alcohol    Types: 1 Standard drinks or equivalent per week    Comment: Wine in the evening   Drug use: No   Social History   Socioeconomic History   Marital status: Widowed    Spouse name: Not on file  Number of children: Not on file   Years of education: Not on file   Highest education level: Not on file  Occupational History   Not on file  Tobacco Use   Smoking status: Never   Smokeless tobacco: Never  Vaping Use   Vaping Use: Never used  Substance and Sexual Activity   Alcohol use: Yes    Alcohol/week: 1.0 standard drink of alcohol    Types: 1 Standard drinks or equivalent per week    Comment: Wine in the evening   Drug use: No   Sexual activity: Not on file  Other Topics Concern   Not on file  Social History Narrative   Not on file   Social Determinants of Health   Financial Resource Strain: Low Risk  (10/08/2021)   Overall Financial  Resource Strain (CARDIA)    Difficulty of Paying Living Expenses: Not hard at all  Food Insecurity: No Food Insecurity (10/08/2021)   Hunger Vital Sign    Worried About Running Out of Food in the Last Year: Never true    Ran Out of Food in the Last Year: Never true  Transportation Needs: No Transportation Needs (10/08/2021)   PRAPARE - Administrator, Civil Service (Medical): No    Lack of Transportation (Non-Medical): No  Physical Activity: Inactive (10/08/2021)   Exercise Vital Sign    Days of Exercise per Week: 0 days    Minutes of Exercise per Session: 0 min  Stress: No Stress Concern Present (10/08/2021)   [employer] of Occupational Health - Occupational Stress Questionnaire    Feeling of Stress : Only a little  Social Connections: Socially Isolated (10/08/2021)   Social Connection and Isolation Panel [NHANES]    Frequency of Communication with Friends and Family: More than three times a week    Frequency of Social Gatherings with Friends and Family: More than three times a week    Attends Religious Services: Never    Database administrator or Organizations: No    Attends Banker Meetings: Never    Marital Status: Widowed  Intimate Partner Violence: Not At Risk (10/08/2021)   Humiliation, Afraid, Rape, and Kick questionnaire    Fear of Current or Ex-Partner: No    Emotionally Abused: No    Physically Abused: No    Sexually Abused: No   Family Status  Relation Name Status   Mother  Deceased   Father  Deceased   Other  (Not Specified)   Other  (Not Specified)   Other  (Not Specified)   Other  (Not Specified)   Other  (Not Specified)   Family History  Problem Relation Age of Onset   Arthritis Other    Hyperlipidemia Other    Heart disease Other    Stroke Other    Hypertension Other    Allergies  Allergen Reactions   Oxycodone-Acetaminophen Anaphylaxis and Itching    Headache, delusion   Nickel Other (See Comments)    Redness and  pain      Review of Systems  Constitutional:  Negative for fever and malaise/fatigue.  HENT:  Negative for congestion.   Eyes:  Negative for blurred vision.  Respiratory:  Negative for cough and shortness of breath.   Cardiovascular:  Negative for chest pain, palpitations and leg swelling.  Gastrointestinal:  Positive for abdominal pain and constipation. Negative for blood in stool, diarrhea, melena, nausea and vomiting.  Genitourinary:  Negative for dysuria, flank pain, frequency, hematuria and urgency.  Musculoskeletal:  Negative for back pain.  Skin:  Negative for rash.  Neurological:  Negative for loss of consciousness and headaches.      Objective:     BP (!) 140/80 (BP Location: Right Arm, Patient Position: Sitting, Cuff Size: Normal)   Pulse 68   Temp 97.8 F (36.6 C) (Oral)   Resp 18   Ht 5' (1.524 m)   Wt 146 lb 9.6 oz (66.5 kg)   SpO2 97%   BMI 28.63 kg/m  BP Readings from Last 3 Encounters:  05/19/23 (!) 140/80  05/17/23 (!) 163/83  03/23/23 132/80   Wt Readings from Last 3 Encounters:  05/19/23 146 lb 9.6 oz (66.5 kg)  05/17/23 145 lb 3.2 oz (65.9 kg)  03/23/23 145 lb 12.8 oz (66.1 kg)   SpO2 Readings from Last 3 Encounters:  05/19/23 97%  05/17/23 97%  03/23/23 96%      Physical Exam Vitals and nursing note reviewed.  Constitutional:      General: She is not in acute distress.    Appearance: Normal appearance. She is well-developed.  HENT:     Head: Normocephalic and atraumatic.  Eyes:     General: No scleral icterus.       Right eye: No discharge.        Left eye: No discharge.  Cardiovascular:     Rate and Rhythm: Normal rate and regular rhythm.     Heart sounds: No murmur heard. Pulmonary:     Effort: Pulmonary effort is normal. No respiratory distress.     Breath sounds: Normal breath sounds.  Abdominal:     General: Abdomen is flat. There is distension.     Palpations: There is no mass.     Tenderness: There is no abdominal  tenderness. There is no right CVA tenderness, left CVA tenderness, guarding or rebound.     Hernia: No hernia is present.  Musculoskeletal:        General: Normal range of motion.     Cervical back: Normal range of motion and neck supple.     Right lower leg: No edema.     Left lower leg: No edema.  Skin:    General: Skin is warm and dry.  Neurological:     General: No focal deficit present.     Mental Status: She is alert and oriented to person, place, and time.  Psychiatric:        Mood and Affect: Mood normal.        Behavior: Behavior normal.        Thought Content: Thought content normal.        Judgment: Judgment normal.      Results for orders placed or performed in visit on 05/19/23  POCT Urinalysis Dipstick (Automated)  Result Value Ref Range   Color, UA yellow    Clarity, UA clear    Glucose, UA Negative Negative   Bilirubin, UA negtaive    Ketones, UA negative    Spec Grav, UA <=1.005 (A) 1.010 - 1.025   Blood, UA negative    pH, UA 6.0 5.0 - 8.0   Protein, UA Negative Negative   Urobilinogen, UA 0.2 0.2 or 1.0 E.U./dL   Nitrite, UA negative    Leukocytes, UA Trace (A) Negative    Last CBC Lab Results  Component Value Date   WBC 6.9 03/23/2023   HGB 14.9 03/23/2023   HCT 45.3 03/23/2023   MCV 86.1 03/23/2023   RDW 15.8 (H) 03/23/2023  PLT 245.0 03/23/2023   Last metabolic panel Lab Results  Component Value Date   GLUCOSE 77 03/23/2023   NA 138 03/23/2023   K 4.7 03/23/2023   CL 104 03/23/2023   CO2 27 03/23/2023   BUN 15 03/23/2023   CREATININE 0.58 03/23/2023   EGFR 90.0 04/07/2023   CALCIUM 9.3 03/23/2023   PROT 6.4 03/23/2023   ALBUMIN 4.2 03/23/2023   BILITOT 0.6 03/23/2023   ALKPHOS 79 03/23/2023   AST 19 03/23/2023   ALT 15 03/23/2023   Last lipids Lab Results  Component Value Date   CHOL 124 03/23/2023   HDL 57.00 03/23/2023   LDLCALC 51 03/23/2023   LDLDIRECT 73.0 04/21/2020   TRIG 81.0 03/23/2023   CHOLHDL 2 03/23/2023    Last hemoglobin A1c Lab Results  Component Value Date   HGBA1C 5.8 03/23/2023   Last thyroid functions Lab Results  Component Value Date   TSH 1.43 08/24/2017   Last vitamin D No results found for: "25OHVITD2", "25OHVITD3", "VD25OH" Last vitamin B12 and Folate No results found for: "VITAMINB12", "FOLATE"    The ASCVD Risk score (Arnett DK, et al., 2019) failed to calculate for the following reasons:   The valid total cholesterol range is 130 to 320 mg/dL    Assessment & Plan:   Problem List Items Addressed This Visit       Unprioritized   RLQ abdominal pain - Primary    No pain today  ? From constipation  Xray abd  If pain returns over the weekend-- go to ER Check labs       Relevant Orders   CBC with Differential/Platelet   Comprehensive metabolic panel   POCT Urinalysis Dipstick (Automated) (Completed)   DG Abd 2 Views   Constipation due to opioid therapy    Miralax daily  Stool softener  Drink plenty of water      Assessment and Plan    Abdominal Pain: Right-sided abdominal pain for about a month, not associated with hip replacement surgery. Possible constipation due to hydrocodone use. -Order abdominal X-ray to assess for constipation. -Order blood work to check for signs of infection or organ dysfunction. -Advise patient to increase water intake, take a stool softener, and use Miralax daily. -If pain worsens, advise patient to go to the emergency room.  Chronic Pain: Patient on hydrocodone for chronic back pain due to arthritis and sciatica, awaiting back surgery. -Continue current pain management plan with hydrocodone and follow-up with pain management specialist.  Cardiovascular Health: Patient has a heart condition, managed by a cardiologist. Recent episode of chest tightness and headache, evaluated in the emergency room, not a heart attack. Small blockages found in vessels to kidney and intestine. -Advise patient to follow-up with vascular surgeon  regarding the blockages. -Continue annual follow-up with cardiologist.  Pending Back Surgery: Back surgery planned after recovery from recent hip replacement. -Continue current plan with orthopedic surgeon for back surgery.  General Health Maintenance: -Advise patient to follow-up next week for re-evaluation if needed. -If pain worsens over the weekend, advise patient to go to the emergency room.        No follow-ups on file.    [name], DO

## 2023-05-26 ENCOUNTER — Telehealth: Payer: Self-pay

## 2023-05-26 DIAGNOSIS — G894 Chronic pain syndrome: Secondary | ICD-10-CM

## 2023-05-26 DIAGNOSIS — M1611 Unilateral primary osteoarthritis, right hip: Secondary | ICD-10-CM | POA: Insufficient documentation

## 2023-05-26 DIAGNOSIS — G8929 Other chronic pain: Secondary | ICD-10-CM

## 2023-05-26 MED ORDER — HYDROCODONE-ACETAMINOPHEN 10-325 MG PO TABS: 1.0000 | ORAL_TABLET | Freq: Three times a day (TID) | ORAL | 0 refills | Status: DC | PRN

## 2023-05-26 NOTE — Addendum Note (Signed)
Addended by: Elijah Birk on: 05/26/2023 03:15 PM   Modules accepted: Orders

## 2023-05-26 NOTE — Telephone Encounter (Signed)
[facility] only gave the 7 day supply. One dose on hand today. Please send the reminder of Rx. Thank you. (REASON THE MAIL ORDER DID NOT HAVE IT IN STOCK).

## 2023-06-08 ENCOUNTER — Other Ambulatory Visit: Payer: Self-pay | Admitting: Family Medicine

## 2023-06-08 DIAGNOSIS — R32 Unspecified urinary incontinence: Secondary | ICD-10-CM

## 2023-06-08 DIAGNOSIS — Z96641 Presence of right artificial hip joint: Secondary | ICD-10-CM | POA: Diagnosis not present

## 2023-06-08 DIAGNOSIS — M47818 Spondylosis without myelopathy or radiculopathy, sacral and sacrococcygeal region: Secondary | ICD-10-CM | POA: Diagnosis not present

## 2023-06-12 DIAGNOSIS — M79644 Pain in right finger(s): Secondary | ICD-10-CM | POA: Diagnosis not present

## 2023-06-12 DIAGNOSIS — M65321 Trigger finger, right index finger: Secondary | ICD-10-CM | POA: Diagnosis not present

## 2023-06-26 ENCOUNTER — Other Ambulatory Visit: Payer: Self-pay | Admitting: Family Medicine

## 2023-06-26 ENCOUNTER — Other Ambulatory Visit (HOSPITAL_BASED_OUTPATIENT_CLINIC_OR_DEPARTMENT_OTHER): Payer: Self-pay | Admitting: Family Medicine

## 2023-06-26 DIAGNOSIS — Z1231 Encounter for screening mammogram for malignant neoplasm of breast: Secondary | ICD-10-CM

## 2023-06-26 DIAGNOSIS — F419 Anxiety disorder, unspecified: Secondary | ICD-10-CM

## 2023-07-06 ENCOUNTER — Telehealth: Payer: Self-pay | Admitting: Family Medicine

## 2023-07-06 DIAGNOSIS — M48061 Spinal stenosis, lumbar region without neurogenic claudication: Secondary | ICD-10-CM | POA: Diagnosis not present

## 2023-07-06 NOTE — Telephone Encounter (Signed)
The patient called to request a renewal of her disability parking placard, which is about to expire at the end of August. She mentioned that she will still need it, as she is scheduled for surgery in January and will be in rehab for several months afterward. She prefers the renewal to extend until August 2025. She requires a letter from Dr. Laury Axon to facilitate this renewal. Please inform the patient if her request can be accommodated.

## 2023-07-10 DIAGNOSIS — M533 Sacrococcygeal disorders, not elsewhere classified: Secondary | ICD-10-CM | POA: Diagnosis not present

## 2023-07-10 DIAGNOSIS — M5416 Radiculopathy, lumbar region: Secondary | ICD-10-CM | POA: Diagnosis not present

## 2023-07-11 NOTE — Telephone Encounter (Signed)
Printed. Placed in folder

## 2023-07-12 NOTE — Telephone Encounter (Signed)
Pt notified form is ready. Form placed in the front for pick up

## 2023-07-20 ENCOUNTER — Ambulatory Visit (INDEPENDENT_AMBULATORY_CARE_PROVIDER_SITE_OTHER): Payer: Medicare Other | Admitting: *Deleted

## 2023-07-20 DIAGNOSIS — Z0000 Encounter for general adult medical examination without abnormal findings: Secondary | ICD-10-CM

## 2023-07-20 NOTE — Patient Instructions (Signed)
[name] , Thank you for taking time to come for your Medicare Wellness Visit. I appreciate your ongoing commitment to your health goals. Please review the following plan we discussed and let me know if I can assist you in the future.   These are the goals we discussed:  Goals      Maintain healthy active lifestyle.        This is a list of the screening recommended for you and due dates:  Health Maintenance  Topic Date Due   Pneumonia Vaccine (2 of 2 - PPSV23 or PCV20) 06/02/2017   COVID-19 Vaccine (10 - 2023-24 season) 07/22/2022   Flu Shot  02/19/2024*   Yearly kidney health urinalysis for diabetes  03/22/2024   Yearly kidney function blood test for diabetes  05/18/2024   Medicare Annual Wellness Visit  07/19/2024   Mammogram  07/20/2024   Colon Cancer Screening  06/27/2026   DTaP/Tdap/Td vaccine (2 - Td or Tdap) 08/01/2028   DEXA scan (bone density measurement)  Completed   Hepatitis C Screening  Completed   Zoster (Shingles) Vaccine  Completed   HPV Vaccine  Aged Out  *Topic was postponed. The date shown is not the original due date.     Next appointment: Follow up in one year for your annual wellness visit.   Preventive Care 51 Years and Older, Female Preventive care refers to lifestyle choices and visits with your health care provider that can promote health and wellness. What does preventive care include? A yearly physical exam. This is also called an annual well check. Dental exams once or twice a year. Routine eye exams. Ask your health care provider how often you should have your eyes checked. Personal lifestyle choices, including: Daily care of your teeth and gums. Regular physical activity. Eating a healthy diet. Avoiding tobacco and drug use. Limiting alcohol use. Practicing safe sex. Taking low-dose aspirin every day. Taking vitamin and mineral supplements as recommended by your health care provider. What happens during an annual well check? The  services and screenings done by your health care provider during your annual well check will depend on your age, overall health, lifestyle risk factors, and family history of disease. Counseling  Your health care provider may ask you questions about your: Alcohol use. Tobacco use. Drug use. Emotional well-being. Home and relationship well-being. Sexual activity. Eating habits. History of falls. Memory and ability to understand (cognition). Work and work Astronomer. Reproductive health. Screening  You may have the following tests or measurements: Height, weight, and BMI. Blood pressure. Lipid and cholesterol levels. These may be checked every 5 years, or more frequently if you are over 14 years old. Skin check. Lung cancer screening. You may have this screening every year starting at age 17 if you have a 30-pack-year history of smoking and currently smoke or have quit within the past 15 years. Fecal occult blood test (FOBT) of the stool. You may have this test every year starting at age 50. Flexible sigmoidoscopy or colonoscopy. You may have a sigmoidoscopy every 5 years or a colonoscopy every 10 years starting at age 108. Hepatitis C blood test. Hepatitis B blood test. Sexually transmitted disease (STD) testing. Diabetes screening. This is done by checking your blood sugar (glucose) after you have not eaten for a while (fasting). You may have this done every 1-3 years. Bone density scan. This is done to screen for osteoporosis. You may have this done starting at age 21. Mammogram. This may be done every  1-2 years. Talk to your health care provider about how often you should have regular mammograms. Talk with your health care provider about your test results, treatment options, and if necessary, the need for more tests. Vaccines  Your health care provider may recommend certain vaccines, such as: Influenza vaccine. This is recommended every year. Tetanus, diphtheria, and acellular  pertussis (Tdap, Td) vaccine. You may need a Td booster every 10 years. Zoster vaccine. You may need this after age 9. Pneumococcal 13-valent conjugate (PCV13) vaccine. One dose is recommended after age 61. Pneumococcal polysaccharide (PPSV23) vaccine. One dose is recommended after age 19. Talk to your health care provider about which screenings and vaccines you need and how often you need them. This information is not intended to replace advice given to you by your health care provider. Make sure you discuss any questions you have with your health care provider. Document Released: 12/04/2015 Document Revised: 07/27/2016 Document Reviewed: 09/08/2015 Elsevier Interactive Patient Education  2017 [employer].  Fall Prevention in the Home Falls can cause injuries. They can happen to people of all ages. There are many things you can do to make your home safe and to help prevent falls. What can I do on the outside of my home? Regularly fix the edges of walkways and driveways and fix any cracks. Remove anything that might make you trip as you walk through a door, such as a raised step or threshold. Trim any bushes or trees on the path to your home. Use bright outdoor lighting. Clear any walking paths of anything that might make someone trip, such as rocks or tools. Regularly check to see if handrails are loose or broken. Make sure that both sides of any steps have handrails. Any raised decks and porches should have guardrails on the edges. Have any leaves, snow, or ice cleared regularly. Use sand or salt on walking paths during winter. Clean up any spills in your garage right away. This includes oil or grease spills. What can I do in the bathroom? Use night lights. Install grab bars by the toilet and in the tub and shower. Do not use towel bars as grab bars. Use non-skid mats or decals in the tub or shower. If you need to sit down in the shower, use a plastic, non-slip stool. Keep the floor  dry. Clean up any water that spills on the floor as soon as it happens. Remove soap buildup in the tub or shower regularly. Attach bath mats securely with double-sided non-slip rug tape. Do not have throw rugs and other things on the floor that can make you trip. What can I do in the bedroom? Use night lights. Make sure that you have a light by your bed that is easy to reach. Do not use any sheets or blankets that are too big for your bed. They should not hang down onto the floor. Have a firm chair that has side arms. You can use this for support while you get dressed. Do not have throw rugs and other things on the floor that can make you trip. What can I do in the kitchen? Clean up any spills right away. Avoid walking on wet floors. Keep items that you use a lot in easy-to-reach places. If you need to reach something above you, use a strong step stool that has a grab bar. Keep electrical cords out of the way. Do not use floor polish or wax that makes floors slippery. If you must use wax, use non-skid  floor wax. Do not have throw rugs and other things on the floor that can make you trip. What can I do with my stairs? Do not leave any items on the stairs. Make sure that there are handrails on both sides of the stairs and use them. Fix handrails that are broken or loose. Make sure that handrails are as long as the stairways. Check any carpeting to make sure that it is firmly attached to the stairs. Fix any carpet that is loose or worn. Avoid having throw rugs at the top or bottom of the stairs. If you do have throw rugs, attach them to the floor with carpet tape. Make sure that you have a light switch at the top of the stairs and the bottom of the stairs. If you do not have them, ask someone to add them for you. What else can I do to help prevent falls? Wear shoes that: Do not have high heels. Have rubber bottoms. Are comfortable and fit you well. Are closed at the toe. Do not wear  sandals. If you use a stepladder: Make sure that it is fully opened. Do not climb a closed stepladder. Make sure that both sides of the stepladder are locked into place. Ask someone to hold it for you, if possible. Clearly mark and make sure that you can see: Any grab bars or handrails. First and last steps. Where the edge of each step is. Use tools that help you move around (mobility aids) if they are needed. These include: Canes. Walkers. Scooters. Crutches. Turn on the lights when you go into a dark area. Replace any light bulbs as soon as they burn out. Set up your furniture so you have a clear path. Avoid moving your furniture around. If any of your floors are uneven, fix them. If there are any pets around you, be aware of where they are. Review your medicines with your doctor. Some medicines can make you feel dizzy. This can increase your chance of falling. Ask your doctor what other things that you can do to help prevent falls. This information is not intended to replace advice given to you by your health care provider. Make sure you discuss any questions you have with your health care provider. Document Released: 09/03/2009 Document Revised: 04/14/2016 Document Reviewed: 12/12/2014 Elsevier Interactive Patient Education  2017 [employer].

## 2023-07-20 NOTE — Progress Notes (Signed)
Subjective:   Erin Acosta is a 72 y.o. female who presents for Medicare Annual (Subsequent) preventive examination.  Visit Complete: Virtual  I connected with  Erin Acosta on 07/20/23 by a audio enabled telemedicine application and verified that I am speaking with the correct person using two identifiers.  Patient Location: Home  Provider Location: Office/Clinic  I discussed the limitations of evaluation and management by telemedicine. The patient expressed understanding and agreed to proceed.   Review of Systems     Cardiac Risk Factors include: advanced age (>35men, >63 women);dyslipidemia;hypertension     Objective:    Vital Signs: Unable to obtain new vitals due to this being a telehealth visit.      07/20/2023   11:11 AM 10/08/2021    2:32 PM 05/07/2020    8:03 AM 06/02/2016    4:32 PM  Advanced Directives  Does Patient Have a Medical Advance Directive? Yes Yes Yes Yes  Type of Estate agent of [place];Living will Healthcare Power of [place];Living will Healthcare Power of [place];Living will Healthcare Power of [place];Living will  Does patient want to make changes to medical advance directive? No - Patient declined  No - Patient declined No - Patient declined  Copy of Healthcare Power of Attorney in Chart? No - copy requested No - copy requested No - copy requested No - copy requested    Current Medications (verified) Outpatient Encounter Medications as of 07/20/2023  Medication Sig   ARIPiprazole (ABILIFY) 2 MG tablet Take 1 tablet (2 mg total) by mouth daily.   aspirin EC 81 MG tablet Take 81 mg by mouth daily.   atorvastatin (LIPITOR) 40 MG tablet TAKE 1 TABLET DAILY   cetirizine (ZYRTEC) 10 MG tablet Take 1 tablet (10 mg total) by mouth daily.   clonazePAM (KLONOPIN) 1 MG tablet Take 1 tablet (1 mg total) by mouth 2 (two) times daily as needed.   escitalopram (LEXAPRO) 20 MG tablet TAKE 1 TABLET DAILY   gentamicin cream  (GARAMYCIN) 0.1 % Apply 1 Application topically 3 (three) times daily.   HYDROcodone-acetaminophen (NORCO) 10-325 MG tablet Take 1 tablet by mouth 3 (three) times daily as needed for severe pain.   mirabegron ER (MYRBETRIQ) 50 MG TB24 tablet Take 1 tablet (50 mg total) by mouth daily.   nitroGLYCERIN (NITROSTAT) 0.4 MG SL tablet Place 1 tablet (0.4 mg total) under the tongue every 5 (five) minutes as needed for chest pain.   olopatadine (PATADAY) 0.1 % ophthalmic solution Place 1 drop into both eyes 2 (two) times daily.   tretinoin (RETIN-A) 0.025 % cream Apply topically at bedtime.   [DISCONTINUED] gabapentin (NEURONTIN) 300 MG capsule Take 1 capsule (300 mg total) by mouth 3 (three) times daily. Start with 1 capsule at nighttime, then if no side effects, increase to 3 times daily (Patient not taking: Reported on 05/19/2023)   [DISCONTINUED] gabapentin (NEURONTIN) 300 MG capsule Take 1 capsule (300 mg total) by mouth at bedtime. (Patient not taking: Reported on 05/19/2023)   [DISCONTINUED] Probiotic Product (PROBIOTIC DAILY PO) Take by mouth. (Patient not taking: Reported on 05/19/2023)   No facility-administered encounter medications on file as of 07/20/2023.    Allergies (verified) Oxycodone-acetaminophen and Nickel   History: Past Medical History:  Diagnosis Date   Allergy    Anxiety    Depression    Genital herpes    H/O cesarean section    Hypertension    Rotator cuff tear    Scoliosis  Past Surgical History:  Procedure Laterality Date   BILATERAL CARPAL TUNNEL RELEASE     BLEPHAROPLASTY     BREAST BIOPSY     unsure which breast   CESAREAN SECTION     FOOT SURGERY Right    mini face lift     TOTAL SHOULDER ARTHROPLASTY Right    [facility]   TRIGGER FINGER RELEASE     tummy tuck     Family History  Problem Relation Age of Onset   Arthritis Other    Hyperlipidemia Other    Heart disease Other    Stroke Other    Hypertension Other    Social History    Socioeconomic History   Marital status: Widowed    Spouse name: Not on file   Number of children: Not on file   Years of education: Not on file   Highest education level: Not on file  Occupational History   Not on file  Tobacco Use   Smoking status: Never   Smokeless tobacco: Never  Vaping Use   Vaping status: Never Used  Substance and Sexual Activity   Alcohol use: Yes    Alcohol/week: 1.0 standard drink of alcohol    Types: 1 Standard drinks or equivalent per week    Comment: Wine in the evening   Drug use: No   Sexual activity: Not on file  Other Topics Concern   Not on file  Social History Narrative   Not on file   Social Determinants of Health   Financial Resource Strain: Low Risk  (07/20/2023)   Overall Financial Resource Strain (CARDIA)    Difficulty of Paying Living Expenses: Not hard at all  Food Insecurity: No Food Insecurity (07/20/2023)   Hunger Vital Sign    Worried About Running Out of Food in the Last Year: Never true    Ran Out of Food in the Last Year: Never true  Transportation Needs: No Transportation Needs (07/20/2023)   PRAPARE - Administrator, Civil Service (Medical): No    Lack of Transportation (Non-Medical): No  Physical Activity: Inactive (07/20/2023)   Exercise Vital Sign    Days of Exercise per Week: 0 days    Minutes of Exercise per Session: 0 min  Stress: No Stress Concern Present (07/20/2023)   [employer] of Occupational Health - Occupational Stress Questionnaire    Feeling of Stress : Only a little  Social Connections: Moderately Isolated (07/20/2023)   Social Connection and Isolation Panel [NHANES]    Frequency of Communication with Friends and Family: Twice a week    Frequency of Social Gatherings with Friends and Family: Twice a week    Attends Religious Services: Never    Database administrator or Organizations: Yes    Attends Banker Meetings: Never    Marital Status: Widowed    Tobacco  Counseling Counseling given: Not Answered   Clinical Intake:  Pre-visit preparation completed: Yes  Pain : 0-10 Pain Score: 5  Pain Type: Chronic pain Pain Location: Back Pain Orientation: Lower Pain Descriptors / Indicators: Aching, Constant Pain Frequency: Constant  Nutritional Risks: None Diabetes: No  How often do you need to have someone help you when you read instructions, pamphlets, or other written materials from your doctor or pharmacy?: 1 - Never  Interpreter Needed?: No  Information entered by :: Donne Anon, CMA   Activities of Daily Living    07/20/2023   11:06 AM  In your present state of health,  do you have any difficulty performing the following activities:  Hearing? 0  Vision? 0  Difficulty concentrating or making decisions? 1  Comment memory  Walking or climbing stairs? 1  Dressing or bathing? 0  Doing errands, shopping? 0  Preparing Food and eating ? N  Using the Toilet? N  In the past six months, have you accidently leaked urine? N  Do you have problems with loss of bowel control? N  Managing your Medications? N  Managing your Finances? N  Housekeeping or managing your Housekeeping? N    Patient Care Team: Zola Button, Grayling Congress, DO as PCP - General (Family Medicine) Malva Cogan, MD as Referring Physician (Dermatology) Blondell Reveal, MD (Obstetrics and Gynecology) Beverely Pace, Osborn Coho, MD as Referring Physician (Cardiology) Jodi Geralds, MD as Consulting Physician (Plastic Surgery) Laurann Montana., MD (Obstetrics and Gynecology)  Indicate any recent Medical Services you may have received from other than [facility] providers in the past year (date may be approximate).     Assessment:   This is a routine wellness examination for Brennah.  Hearing/Vision screen No results found.  Dietary issues and exercise activities discussed:     Goals Addressed   None    Depression Screen    07/20/2023   11:25 AM 05/17/2023    11:25 AM 02/13/2023    2:52 PM 10/05/2022   11:28 AM 09/05/2022   10:45 AM 10/08/2021    2:40 PM 04/06/2021    5:53 PM  PHQ 2/9 Scores  PHQ - 2 Score 0 2 0 0 0 0 1  PHQ- 9 Score     2  7    Fall Risk    07/20/2023   11:11 AM 05/17/2023   11:24 AM 02/13/2023    2:51 PM 11/02/2022   11:56 AM 10/05/2022   11:28 AM  Fall Risk   Falls in the past year? 0 0 0 0 0  Number falls in past yr: 0 0 0    Injury with Fall? 0  0    Risk for fall due to : No Fall Risks      Follow up Falls evaluation completed        MEDICARE RISK AT HOME: Medicare Risk at Home Any stairs in or around the home?: No If so, are there any without handrails?: No Home free of loose throw rugs in walkways, pet beds, electrical cords, etc?: Yes Adequate lighting in your home to reduce risk of falls?: Yes Life alert?: No Use of a cane, walker or w/c?: No Grab bars in the bathroom?: Yes Shower chair or bench in shower?: Yes (built in bench) Elevated toilet seat or a handicapped toilet?: Yes (chair height)  TIMED UP AND GO:  Was the test performed?  No    Cognitive Function:        07/20/2023   11:26 AM  6CIT Screen  What Year? 0 points  What month? 0 points  What time? 0 points  Count back from 20 0 points  Months in reverse 0 points  Repeat phrase 0 points  Total Score 0 points    Immunizations Immunization History  Administered Date(s) Administered   Fluad Quad(high Dose 65+) 07/15/2019, 08/04/2022   Influenza Split 08/05/2016, 09/18/2017   Influenza, High Dose Seasonal PF 08/01/2018   Influenza-Unspecified 08/05/2016, 09/18/2017, 08/01/2018, 08/19/2021   Moderna Covid-19 Vaccine Bivalent Booster 35yrs & up 10/01/2021   PFIZER Comirnaty(Gray Top)Covid-19 Tri-Sucrose Vaccine 01/25/2020, 02/15/2020, 08/21/2020, 02/26/2021   PFIZER(Purple Top)SARS-COV-2  Vaccination 01/25/2020, 02/15/2020, 08/21/2020, 02/26/2021   Pneumococcal Conjugate-13 06/02/2016   Tdap 08/01/2018   Zoster  Recombinant(Shingrix) 02/16/2017, 05/19/2017   Zoster, Live 11/21/2013   Zoster, Unspecified 11/21/2013, 02/16/2017, 05/19/2017    TDAP status: Up to date  Flu Vaccine status: Due, Education has been provided regarding the importance of this vaccine. Advised may receive this vaccine at local pharmacy or Health Dept. Aware to provide a copy of the vaccination record if obtained from local pharmacy or Health Dept. Verbalized acceptance and understanding.  Pneumococcal vaccine status: Due, Education has been provided regarding the importance of this vaccine. Advised may receive this vaccine at local pharmacy or Health Dept. Aware to provide a copy of the vaccination record if obtained from local pharmacy or Health Dept. Verbalized acceptance and understanding.  Covid-19 vaccine status: Information provided on how to obtain vaccines.   Qualifies for Shingles Vaccine? Yes   Zostavax completed Yes   Shingrix Completed?: Yes  Screening Tests Health Maintenance  Topic Date Due   Pneumonia Vaccine 43+ Years old (2 of 2 - PPSV23 or PCV20) 06/02/2017   COVID-19 Vaccine (10 - 2023-24 season) 07/22/2022   Medicare Annual Wellness (AWV)  10/08/2022   INFLUENZA VACCINE  06/22/2023   Diabetic kidney evaluation - Urine ACR  03/22/2024   Diabetic kidney evaluation - eGFR measurement  05/18/2024   MAMMOGRAM  07/20/2024   Colonoscopy  06/27/2026   DTaP/Tdap/Td (2 - Td or Tdap) 08/01/2028   DEXA SCAN  Completed   Hepatitis C Screening  Completed   Zoster Vaccines- Shingrix  Completed   HPV VACCINES  Aged Out    Health Maintenance  Health Maintenance Due  Topic Date Due   Pneumonia Vaccine 24+ Years old (2 of 2 - PPSV23 or PCV20) 06/02/2017   COVID-19 Vaccine (10 - 2023-24 season) 07/22/2022   Medicare Annual Wellness (AWV)  10/08/2022   INFLUENZA VACCINE  06/22/2023    Colorectal cancer screening: Type of screening: Colonoscopy. Completed 06/27/16. Repeat every 10 years  Mammogram status:  Completed 07/20/22. Repeat every year.  Next mammo scheduled for 07/31/23.  Bone Density status: Completed 10/23/21. Results reflect: Bone density results: OSTEOPENIA. Repeat every 2 years.  Lung Cancer Screening: (Low Dose CT Chest recommended if Age 38-80 years, 20 pack-year currently smoking OR have quit w/in 15years.) does not qualify.   Additional Screening:  Hepatitis C Screening: does qualify; Completed 05/08/17  Vision Screening: Recommended annual ophthalmology exams for early detection of glaucoma and other disorders of the eye. Is the patient up to date with their annual eye exam?  Yes  Who is the provider or what is the name of the office in which the patient attends annual eye exams? Dr. Sherrine Maples If pt is not established with a provider, would they like to be referred to a provider to establish care? No .   Dental Screening: Recommended annual dental exams for proper oral hygiene  Diabetic Foot Exam: N/a  Community Resource Referral / Chronic Care Management: CRR required this visit?  No   CCM required this visit?  No     Plan:     I have personally reviewed and noted the following in the patient's chart:   Medical and social history Use of alcohol, tobacco or illicit drugs  Current medications and supplements including opioid prescriptions. Patient is not currently taking opioid prescriptions. Functional ability and status Nutritional status Physical activity Advanced directives List of other physicians Hospitalizations, surgeries, and ER visits in previous 12 months Vitals Screenings  to include cognitive, depression, and falls Referrals and appointments  In addition, I have reviewed and discussed with patient certain preventive protocols, quality metrics, and best practice recommendations. A written personalized care plan for preventive services as well as general preventive health recommendations were provided to patient.     Donne Anon, CMA   07/20/2023    After Visit Summary: (MyChart) Due to this being a telephonic visit, the after visit summary with patients personalized plan was offered to patient via MyChart   Nurse Notes: None

## 2023-07-31 ENCOUNTER — Encounter (HOSPITAL_BASED_OUTPATIENT_CLINIC_OR_DEPARTMENT_OTHER): Payer: Self-pay

## 2023-07-31 ENCOUNTER — Ambulatory Visit (HOSPITAL_BASED_OUTPATIENT_CLINIC_OR_DEPARTMENT_OTHER)
Admission: RE | Admit: 2023-07-31 | Discharge: 2023-07-31 | Disposition: A | Discharge status: Home / Self Care | Payer: Medicare Other | Source: Ambulatory Visit | Attending: Family Medicine | Admitting: Family Medicine

## 2023-07-31 DIAGNOSIS — Z1231 Encounter for screening mammogram for malignant neoplasm of breast: Secondary | ICD-10-CM | POA: Diagnosis not present

## 2023-08-14 DIAGNOSIS — Z23 Encounter for immunization: Secondary | ICD-10-CM | POA: Diagnosis not present

## 2023-08-17 DIAGNOSIS — H524 Presbyopia: Secondary | ICD-10-CM | POA: Diagnosis not present

## 2023-08-17 DIAGNOSIS — H25813 Combined forms of age-related cataract, bilateral: Secondary | ICD-10-CM | POA: Diagnosis not present

## 2023-08-17 DIAGNOSIS — H04123 Dry eye syndrome of bilateral lacrimal glands: Secondary | ICD-10-CM | POA: Diagnosis not present

## 2023-08-17 DIAGNOSIS — H52223 Regular astigmatism, bilateral: Secondary | ICD-10-CM | POA: Diagnosis not present

## 2023-08-17 DIAGNOSIS — H5213 Myopia, bilateral: Secondary | ICD-10-CM | POA: Diagnosis not present

## 2023-08-17 DIAGNOSIS — H1045 Other chronic allergic conjunctivitis: Secondary | ICD-10-CM | POA: Diagnosis not present

## 2023-08-17 DIAGNOSIS — H43812 Vitreous degeneration, left eye: Secondary | ICD-10-CM | POA: Diagnosis not present

## 2023-08-20 NOTE — Progress Notes (Deleted)
Erin Acosta is a 72 y.o. year old female  who  has a past medical history of Allergy, Anxiety, Depression, Genital herpes, H/O cesarean section, Hypertension, Rotator cuff tear, and Scoliosis.   They are presenting to PM&R clinic for follow up related to back and R hip pain.  .  Plan from last visit: Chronic pain syndrome Encounter for medication monitoring Encounter for long-term use of opiate analgesic Today, we discussed the increasing pain in your legs and you requested a dose escalation of your Norco.  I do not feel this is appropriate at this time.  We did discuss switching to similar morphine milliequivalents of oxycodone versus transitioning to a Butrans patch, neither of which seem appropriate after discussing risks and benefits.   I refilled your Norco 10 mg 3 times daily as needed for 3 months today, follow-up with me in 3 months.   Indication for chronic opioid: Chronic low back and R hip pain Medication and dose: Norco 10 mg 1 am /1 qnoon /0.5-1 qhs tab daily # pills per month: 90 (#270 for 3 month supply given [place] be [place] at next script due) Last UDS date: 11/02/22 Opioid Treatment Agreement signed (Y/N): 09/05/22 Opioid Treatment Agreement last reviewed with patient:   NCCSRS/PDMP reviewed this encounter (include red flags): Yes    Management will include:   Medium Risk (10-90 MME) UDS every 3-6 months NCCSR check every visit Follow up Q3M      Chronic right hip pain Bilateral sciatica I am starting you on a medication called gabapentin.  You will take one 300 mg capsule at nighttime for 1 week, then if no side effects can increase to 1 capsule 3 times daily.  Call me in 2 weeks to let me know how this medication is going.    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    - Follow ups: Had R SI joint injection planned with Dr. Alma Friendly 07/10/23 but deferred d/t redicular symptoms, refused ESI d/t prior  discomfort, was prescribed steroid dosepak PRN.  Has lumbar surgery with Dr. Jiles Prows scheduled tentatively, has international travel planned in October; he is recommending L3-S1 Decompression / Fusion in January 2025. Getting MRI and bloodwork . Will be going to [facility] after surgery.    - Falls: none   - DME: none   - Medications:   Gabapentin filled 05/19/23 for 90 days 300 mg TID - has been taking, no side effects, no improvement in pain with use. BUN/CR 6/28 WNL.   Norco 10 mg TID PRN #90 days filled 05/27/23. Will be gone 10/17 - 11/14. She says she still has some medication at home and does not always take it TID, sometimes only BID. Wants medication sent to [facility] because mail order has been unreliable.    - Other concerns:  none   Physical Exam   Constitution: Appropriate appearance for age. No apparent distress Resp: No respiratory distress. No accessory muscle usage. on RA Cardio: Well perfused appearance. No peripheral edema. Abdomen: Nondistended. Nontender.   Psych: Appropriate mood and affect. Neuro: AAOx4. No apparent cognitive deficits    Neurologic Exam:   Sensory exam: revealed normal sensation in all dermatomal regions in bilateral lower extremities Motor exam: strength 5/5 throughout bilateral lower extremities Coordination: Fine motor coordination was normal.   Gait: normal   MSK:  Good AROM b/l hip flexion, extension; knee flexion, extension. Remains with tight hamstrings.   Facet loading: + low  back pain R>L  TTP at lumbar paraspinals: Mild, bilateral   SAMMANTHA Acosta is a 72 y.o. year old female  who  has a past medical history of Allergy, Anxiety, Depression, Genital herpes, H/O cesarean section, Hypertension, Rotator cuff tear, and Scoliosis.   They are presenting to PM&R clinic as a new patient for treatment of back and R hip pain .    There are no diagnoses linked to this encounter.    Has lumbar surgery with Dr. Jiles Prows scheduled tentatively  early 2025 d/t international travel planned in October; he is recommending L3-S1 Decompression / Fusion.

## 2023-08-21 ENCOUNTER — Encounter: Payer: Self-pay | Admitting: Physical Medicine and Rehabilitation

## 2023-08-21 ENCOUNTER — Encounter
Payer: Medicare Other | Attending: Physical Medicine and Rehabilitation | Admitting: Physical Medicine and Rehabilitation

## 2023-08-21 VITALS — BP 128/76 | HR 63 | Ht 60.0 in | Wt 150.2 lb

## 2023-08-21 DIAGNOSIS — Z79891 Long term (current) use of opiate analgesic: Secondary | ICD-10-CM | POA: Diagnosis not present

## 2023-08-21 DIAGNOSIS — M6289 Other specified disorders of muscle: Secondary | ICD-10-CM | POA: Diagnosis not present

## 2023-08-21 DIAGNOSIS — M5432 Sciatica, left side: Secondary | ICD-10-CM | POA: Diagnosis not present

## 2023-08-21 DIAGNOSIS — M5431 Sciatica, right side: Secondary | ICD-10-CM | POA: Insufficient documentation

## 2023-08-21 DIAGNOSIS — Z5181 Encounter for therapeutic drug level monitoring: Secondary | ICD-10-CM | POA: Insufficient documentation

## 2023-08-21 DIAGNOSIS — T402X5A Adverse effect of other opioids, initial encounter: Secondary | ICD-10-CM | POA: Insufficient documentation

## 2023-08-21 DIAGNOSIS — K5903 Drug induced constipation: Secondary | ICD-10-CM | POA: Insufficient documentation

## 2023-08-21 DIAGNOSIS — G894 Chronic pain syndrome: Secondary | ICD-10-CM | POA: Insufficient documentation

## 2023-08-21 MED ORDER — SENNOSIDES-DOCUSATE SODIUM 8.6-50 MG PO TABS: 1.0000 | ORAL_TABLET | Freq: Every day | ORAL | 3 refills | Status: DC

## 2023-08-21 MED ORDER — GABAPENTIN 600 MG PO TABS: 600.0000 mg | ORAL_TABLET | Freq: Two times a day (BID) | ORAL | 3 refills | Status: DC

## 2023-08-21 NOTE — Patient Instructions (Addendum)
Follow up with eunice every 3 months for chronic pain management. Norco refill for 3 months will be sent to [facility] 10/5. I am also refilling your sennakot.   After your surgery in January, we will work on weaning off of norco  Gabapentin we will change from three times daily to twice daily. Start with 300 mg tablet in the morning (prior dose) and 600 mg at nighttime for 1 week; if tolerating well, increase to 600 mg twice daily.

## 2023-08-21 NOTE — Progress Notes (Signed)
Subjective:    Patient ID: Erin Acosta, female    DOB: [birth year], 72 y.o.   MRN: [id number]  HPI Erin Acosta is a 72 y.o. year old female  who  has a past medical history of Allergy, Anxiety, Depression, Genital herpes, H/O cesarean section, Hypertension, Rotator cuff tear, and Scoliosis.   They are presenting to PM&R clinic for follow up related to back and R hip pain.  .   Plan from last visit: Chronic pain syndrome Encounter for medication monitoring Encounter for long-term use of opiate analgesic Today, we discussed the increasing pain in your legs and you requested a dose escalation of your Norco.  I do not feel this is appropriate at this time.  We did discuss switching to similar morphine milliequivalents of oxycodone versus transitioning to a Butrans patch, neither of which seem appropriate after discussing risks and benefits.   I refilled your Norco 10 mg 3 times daily as needed for 3 months today, follow-up with me in 3 months.   Indication for chronic opioid: Chronic low back and R hip pain Medication and dose: Norco 10 mg 1 am /1 qnoon /0.5-1 qhs tab daily # pills per month: 90 (#270 for 3 month supply given will be [place] of country at next script due) Last UDS date: 11/02/22 Opioid Treatment Agreement signed (Y/N): 09/05/22 Opioid Treatment Agreement last reviewed with patient:   NCCSRS/PDMP reviewed this encounter (include red flags): Yes    Management will include:   Medium Risk (10-90 MME) UDS every 3-6 months NCCSR check every visit Follow up Q3M      Chronic right hip pain Bilateral sciatica I am starting you on a medication called gabapentin.  You will take one 300 mg capsule at nighttime for 1 week, then if no side effects can increase to 1 capsule 3 times daily.  Call me in 2 weeks to let me know how this medication is going.     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     - Follow ups: Had R SI joint  injection planned with Dr. Alma Friendly 07/10/23 but deferred d/t redicular symptoms, refused ESI d/t prior discomfort, was prescribed steroid dosepak PRN.   Has lumbar surgery with Dr. Jiles Prows scheduled tentatively, has international travel planned in October; he is recommending L3-S1 Decompression / Fusion in January 2025. Getting MRI and bloodwork . Will be going to [facility] after surgery.     - Falls: none    - DME: none    - Medications:    Gabapentin filled 05/19/23 for 90 days 300 mg TID - has been taking, no side effects, no improvement in pain with use. BUN/CR 6/28 WNL.    Norco 10 mg TID PRN #90 days filled 05/27/23. Will be gone 10/17 - 11/14. She says she still has some medication at home and does not always take it TID, sometimes only BID. Wants medication sent to [facility] because mail order has been unreliable.     - Other concerns:  none     Pain Inventory Average Pain 7 Pain Right Now 7 My pain is aching  In the last 24 hours, has pain interfered with the following? General activity 8 Relation with others 8 Enjoyment of life 8 What TIME of day is your pain at its worst? morning  Sleep (in general) Fair  Pain is worse with: inactivity and standing Pain improves with: medication Relief from Meds:  not specified  Family History  Problem Relation Age of Onset   Arthritis Other    Hyperlipidemia Other    Heart disease Other    Stroke Other    Hypertension Other    Social History   Socioeconomic History   Marital status: Widowed    Spouse name: Not on file   Number of children: Not on file   Years of education: Not on file   Highest education level: Not on file  Occupational History   Not on file  Tobacco Use   Smoking status: Never   Smokeless tobacco: Never  Vaping Use   Vaping status: Never Used  Substance and Sexual Activity   Alcohol use: Yes    Alcohol/week: 1.0 standard drink of alcohol    Types: 1 Standard drinks or equivalent per week    Comment:  Wine in the evening   Drug use: No   Sexual activity: Not on file  Other Topics Concern   Not on file  Social History Narrative   Not on file   Social Determinants of Health   Financial Resource Strain: Low Risk  (07/20/2023)   Overall Financial Resource Strain (CARDIA)    Difficulty of Paying Living Expenses: Not hard at all  Food Insecurity: No Food Insecurity (07/20/2023)   Hunger Vital Sign    Worried About Running Out of Food in the Last Year: Never true    Ran Out of Food in the Last Year: Never true  Transportation Needs: No Transportation Needs (07/20/2023)   PRAPARE - Administrator, Civil Service (Medical): No    Lack of Transportation (Non-Medical): No  Physical Activity: Inactive (07/20/2023)   Exercise Vital Sign    Days of Exercise per Week: 0 days    Minutes of Exercise per Session: 0 min  Stress: No Stress Concern Present (07/20/2023)   [employer] of Occupational Health - Occupational Stress Questionnaire    Feeling of Stress : Only a little  Social Connections: Moderately Isolated (07/20/2023)   Social Connection and Isolation Panel [NHANES]    Frequency of Communication with Friends and Family: Twice a week    Frequency of Social Gatherings with Friends and Family: Twice a week    Attends Religious Services: Never    Database administrator or Organizations: Yes    Attends Banker Meetings: Never    Marital Status: Widowed   Past Surgical History:  Procedure Laterality Date   BILATERAL CARPAL TUNNEL RELEASE     BLEPHAROPLASTY     BREAST BIOPSY     unsure which breast   CESAREAN SECTION     FOOT SURGERY Right    mini face lift     TOTAL SHOULDER ARTHROPLASTY Right    [facility]   TRIGGER FINGER RELEASE     tummy tuck     Past Surgical History:  Procedure Laterality Date   BILATERAL CARPAL TUNNEL RELEASE     BLEPHAROPLASTY     BREAST BIOPSY     unsure which breast   CESAREAN SECTION     FOOT SURGERY Right    mini face  lift     TOTAL SHOULDER ARTHROPLASTY Right    [facility]   TRIGGER FINGER RELEASE     tummy tuck     Past Medical History:  Diagnosis Date   Allergy    Anxiety    Depression    Genital herpes    H/O cesarean section    Hypertension  Rotator cuff tear    Scoliosis    BP 128/76   Pulse 63   Ht 5' (1.524 m)   Wt 150 lb 3.2 oz (68.1 kg)   SpO2 96%   BMI 29.33 kg/m   Opioid Risk Score:   Fall Risk Score:  `1  Depression screen [facility] 2/9     08/21/2023    1:07 PM 07/20/2023   11:25 AM 05/17/2023   11:25 AM 02/13/2023    2:52 PM 10/05/2022   11:28 AM 09/05/2022   10:45 AM 10/08/2021    2:40 PM  Depression screen PHQ 2/9  Decreased Interest 0 0 1 0 0 0 0  Down, Depressed, Hopeless 0 0 1 0 0 0 0  PHQ - 2 Score 0 0 2 0 0 0 0  Altered sleeping      1   Tired, decreased energy      1   Change in appetite      0   Feeling bad or failure about yourself       0   Trouble concentrating      0   Moving slowly or fidgety/restless      0   Suicidal thoughts      0   PHQ-9 Score      2      Review of Systems  Constitutional: Negative.   HENT: Negative.    Eyes: Negative.   Respiratory: Negative.    Cardiovascular: Negative.   Gastrointestinal: Negative.   Endocrine: Negative.   Genitourinary: Negative.   Musculoskeletal:  Positive for back pain.  Skin: Negative.   Allergic/Immunologic: Negative.   Neurological: Negative.   Hematological: Negative.   Psychiatric/Behavioral: Negative.    All other systems reviewed and are negative.      Objective:   Physical Exam   Constitution: Appropriate appearance for age. No apparent distress Resp: No respiratory distress. No accessory muscle usage. on RA Cardio: Well perfused appearance. No peripheral edema. Abdomen: Nondistended. Nontender.   Psych: Appropriate mood and affect. Neuro: AAOx4. No apparent cognitive deficits    Neurologic Exam:   Sensory exam: revealed normal sensation in all dermatomal regions in  bilateral lower extremities Motor exam: strength 5/5 throughout bilateral lower extremities Coordination: Fine motor coordination was normal.   Gait: normal   MSK:   Facet loading: + low back pain bilaterally  TTP at lumbar paraspinals: Mild, bilateral  No gross abnormalities, full AROM bilateral lower extremities      Assessment & Plan:   AYME SHORT is a 72 y.o. year old female  who  has a past medical history of Allergy, Anxiety, Depression, Genital herpes, H/O cesarean section, Hypertension, Rotator cuff tear, and Scoliosis.   They are presenting to PM&R clinic for follow up of chronic back and R hip pain, with chronic pain management .    Chronic pain syndrome Bilateral sciatica Has lumbar surgery with Dr. Jiles Prows scheduled tentatively early 2025 d/t international travel planned in October; he is recommending L3-S1 Decompression / Fusion.  Increase gabapentin to 600 mg BID  Refused recent ESI due to no prior benefit  Encounter for medication monitoring Encounter for long-term use of opiate analgesic Follow up with eunice every 3 months for chronic pain management.   Norco refill for 3 months will be sent to [facility] 10/5 per patient request due to being out of country 10/17 - 11/14.  After your surgery in January, we will work on weaning off of norco  Hamstring  tightness of right lower extremity Continue HEP and remaining active; hold further PT for post-op since she will need that for recover  Constipation due to opioid therapy Patient requesting sennakot-s refill today; sent to pharmacy.   Other orders -     Gabapentin; Take 1 tablet (600 mg total) by mouth 2 (two) times daily. Start with 300 mg tablet in the morning (prior dose) and 600 mg at nighttime for 1 week; if tolerating well, increase to 600 mg twice daily.  Dispense: 60 tablet; Refill: 3 -     Sennosides-Docusate Sodium; Take 1 tablet by mouth at bedtime.  Dispense: 90 tablet; Refill: 3

## 2023-08-28 MED ORDER — HYDROCODONE-ACETAMINOPHEN 10-325 MG PO TABS: 1.0000 | ORAL_TABLET | Freq: Three times a day (TID) | ORAL | 0 refills | Status: DC | PRN

## 2023-09-06 ENCOUNTER — Other Ambulatory Visit: Payer: Self-pay | Admitting: Family Medicine

## 2023-09-06 ENCOUNTER — Telehealth: Payer: Self-pay | Admitting: Family Medicine

## 2023-09-06 DIAGNOSIS — R32 Unspecified urinary incontinence: Secondary | ICD-10-CM

## 2023-09-06 DIAGNOSIS — F419 Anxiety disorder, unspecified: Secondary | ICD-10-CM

## 2023-09-06 MED ORDER — ESCITALOPRAM OXALATE 20 MG PO TABS
20.0000 mg | ORAL_TABLET | Freq: Every day | ORAL | 1 refills | Status: DC
Start: 2023-09-06 — End: 2024-02-12

## 2023-09-06 MED ORDER — MIRABEGRON ER 50 MG PO TB24
50.0000 mg | ORAL_TABLET | Freq: Every day | ORAL | 1 refills | Status: DC
Start: 2023-09-06 — End: 2023-10-09

## 2023-09-06 NOTE — Addendum Note (Signed)
Addended byConrad [place] D on: 09/06/2023 03:20 PM   Modules accepted: Orders

## 2023-09-06 NOTE — Telephone Encounter (Signed)
Rxs sent

## 2023-09-06 NOTE — Telephone Encounter (Signed)
**  Pt called stating that she was sure the impression that she would have enough meds to get her through her trip but she is about 2 weeks short. Pt is requesting a 2 week supply for the following medications to be sent in tonight since she will be leaving tomorrow. Routed HP due to time sensitivity.**  Prescription Request  09/06/2023  Is this a "Controlled Substance" medicine? No  LOV: 05/19/2023  What is the name of the medication or equipment?   mirabegron ER (MYRBETRIQ) 50 MG TB24 tablet [[id number]]  escitalopram (LEXAPRO) 20 MG tablet [[id number]]  Have you contacted your pharmacy to request a refill? No   Which pharmacy would you like this sent to?   [id number] - [place], [place] - [address] AT [facility] [address] [place] [id number] Phone: [phone] Fax: [phone]    Patient notified that their request is being sent to the clinical staff for review and that they should receive a response within 2 business days.   Please advise at Mobile [phone] (mobile)

## 2023-10-09 ENCOUNTER — Telehealth (INDEPENDENT_AMBULATORY_CARE_PROVIDER_SITE_OTHER): Payer: Medicare Other | Admitting: Family

## 2023-10-09 DIAGNOSIS — R32 Unspecified urinary incontinence: Secondary | ICD-10-CM | POA: Diagnosis not present

## 2023-10-09 DIAGNOSIS — J069 Acute upper respiratory infection, unspecified: Secondary | ICD-10-CM | POA: Diagnosis not present

## 2023-10-09 MED ORDER — MIRABEGRON ER 50 MG PO TB24
50.0000 mg | ORAL_TABLET | Freq: Every day | ORAL | 1 refills | Status: DC
Start: 2023-10-09 — End: 2023-10-09

## 2023-10-09 MED ORDER — MIRABEGRON ER 50 MG PO TB24
50.0000 mg | ORAL_TABLET | Freq: Every day | ORAL | 0 refills | Status: DC
Start: 2023-10-09 — End: 2024-03-15

## 2023-10-09 NOTE — Progress Notes (Signed)
MyChart Video Visit    Virtual Visit via Video Note    Patient location: Home. Patient and provider in visit Provider location: Office  I discussed the limitations of evaluation and management by telemedicine and the availability of in person appointments. The patient expressed understanding and agreed to proceed.  Visit Date: 10/09/2023  Today's healthcare provider: Lemont Fillers, NP     Subjective:    Patient ID: Erin Acosta, female    DOB: 07/06/1951, 72 y.o.   MRN: [id number]  Chief Complaint  Patient presents with   Nasal Congestion    Complains of nasal congestion, runny nose.  Tested negative for covid on Sunday    HPI  Patient is a 72 yr old female who presents today to discuss sinus congestion. She just returned from a 1 month overseas visit. She was on  cruise.  Roommate was sick the last week of the trip.  Saturday she started to feel a little unwell. Sunday she woke up with a sore throat.  She tested for covid- was negative yesterday.   She called office Monday AM.  She reports that she has sinus congestion.  Has pressure behind her eyes.  Throat is just mildly sore, not bad. Has coughed up a little bit of yellow phlegm. Both ears feel "achy."   She is out of her OAB medication and is asking for a [facility] day [facility] and 14 day rx to [facility].   Past Medical History:  Diagnosis Date   Allergy    Anxiety    Depression    Genital herpes    H/O cesarean section    Hypertension    Rotator cuff tear    Scoliosis     Past Surgical History:  Procedure Laterality Date   BILATERAL CARPAL TUNNEL RELEASE     BLEPHAROPLASTY     BREAST BIOPSY     unsure which breast   CESAREAN SECTION     FOOT SURGERY Right    mini face lift     TOTAL SHOULDER ARTHROPLASTY Right    [facility]   TRIGGER FINGER RELEASE     tummy tuck      Family History  Problem Relation Age of Onset   Arthritis Other    Hyperlipidemia Other    Heart disease  Other    Stroke Other    Hypertension Other     Social History   Socioeconomic History   Marital status: Widowed    Spouse name: Not on file   Number of children: Not on file   Years of education: Not on file   Highest education level: Not on file  Occupational History   Not on file  Tobacco Use   Smoking status: Never   Smokeless tobacco: Never  Vaping Use   Vaping status: Never Used  Substance and Sexual Activity   Alcohol use: Yes    Alcohol/week: 1.0 standard drink of alcohol    Types: 1 Standard drinks or equivalent per week    Comment: Wine in the evening   Drug use: No   Sexual activity: Not on file  Other Topics Concern   Not on file  Social History Narrative   Not on file   Social Determinants of Health   Financial Resource Strain: Low Risk  (07/20/2023)   Overall Financial Resource Strain (CARDIA)    Difficulty of Paying Living Expenses: Not hard at all  Food Insecurity: No Food Insecurity (07/20/2023)  Hunger Vital Sign    Worried About Running Out of Food in the Last Year: Never true    Ran Out of Food in the Last Year: Never true  Transportation Needs: No Transportation Needs (07/20/2023)   PRAPARE - Administrator, Civil Service (Medical): No    Lack of Transportation (Non-Medical): No  Physical Activity: Inactive (07/20/2023)   Exercise Vital Sign    Days of Exercise per Week: 0 days    Minutes of Exercise per Session: 0 min  Stress: No Stress Concern Present (07/20/2023)   [employer] of Occupational Health - Occupational Stress Questionnaire    Feeling of Stress : Only a little  Social Connections: Moderately Isolated (07/20/2023)   Social Connection and Isolation Panel [NHANES]    Frequency of Communication with Friends and Family: Twice a week    Frequency of Social Gatherings with Friends and Family: Twice a week    Attends Religious Services: Never    Database administrator or Organizations: Yes    Attends Banker  Meetings: Never    Marital Status: Widowed  Intimate Partner Violence: Not At Risk (07/20/2023)   Humiliation, Afraid, Rape, and Kick questionnaire    Fear of Current or Ex-Partner: No    Emotionally Abused: No    Physically Abused: No    Sexually Abused: No    Outpatient Medications Prior to Visit  Medication Sig Dispense Refill   ARIPiprazole (ABILIFY) 2 MG tablet Take 1 tablet (2 mg total) by mouth daily. 30 tablet 2   aspirin EC 81 MG tablet Take 81 mg by mouth daily.     atorvastatin (LIPITOR) 40 MG tablet TAKE 1 TABLET DAILY 90 tablet 1   cetirizine (ZYRTEC) 10 MG tablet Take 1 tablet (10 mg total) by mouth daily. 90 tablet 1   clonazePAM (KLONOPIN) 1 MG tablet Take 1 tablet (1 mg total) by mouth 2 (two) times daily as needed. 180 tablet 1   escitalopram (LEXAPRO) 20 MG tablet Take 1 tablet (20 mg total) by mouth daily. 90 tablet 1   gabapentin (NEURONTIN) 600 MG tablet Take 1 tablet (600 mg total) by mouth 2 (two) times daily. Start with 300 mg tablet in the morning (prior dose) and 600 mg at nighttime for 1 week; if tolerating well, increase to 600 mg twice daily. 60 tablet 3   gentamicin cream (GARAMYCIN) 0.1 % Apply 1 Application topically 3 (three) times daily. 15 g 0   HYDROcodone-acetaminophen (NORCO) 10-325 MG tablet Take 1 tablet by mouth 3 (three) times daily as needed for severe pain. 270 tablet 0   nitroGLYCERIN (NITROSTAT) 0.4 MG SL tablet Place 1 tablet (0.4 mg total) under the tongue every 5 (five) minutes as needed for chest pain. 25 tablet 3   olopatadine (PATADAY) 0.1 % ophthalmic solution Place 1 drop into both eyes 2 (two) times daily. 5 mL 12   senna-docusate (SENOKOT-S) 8.6-50 MG tablet Take 1 tablet by mouth at bedtime. 90 tablet 3   tretinoin (RETIN-A) 0.025 % cream Apply topically at bedtime. 45 g 0   mirabegron ER (MYRBETRIQ) 50 MG TB24 tablet Take 1 tablet (50 mg total) by mouth daily. 90 tablet 1   No facility-administered medications prior to visit.     Allergies  Allergen Reactions   Oxycodone-Acetaminophen Anaphylaxis and Itching    Headache, delusion   Nickel Other (See Comments)    Redness and pain    ROS See HPI    Objective:  Physical Exam  There were no vitals taken for this visit. Wt Readings from Last 3 Encounters:  08/21/23 150 lb 3.2 oz (68.1 kg)  05/19/23 146 lb 9.6 oz (66.5 kg)  05/17/23 145 lb 3.2 oz (65.9 kg)       Gen: Awake, alert, no acute distress ENT: sounds congested Resp: Breathing is even and non-labored Psych: calm/pleasant demeanor Neuro: Alert and Oriented x 3, + facial symmetry, speech is clear.  Assessment & Plan:   Problem List Items Addressed This Visit       Unprioritized   Viral URI - Primary    New.  Symptoms began 3 days ago.  She is convinced that she has a bacterial sinus infection.  I advised pt that typically symptoms are viral at this point during the infection and that bacterial sinusitis typically does not develop until day 7-10 of the viral infection.  I advised her on some otc options for symptom management but she declines OTC medication. She is agreeable to nasal saline rinses, tylenol prn.  She is advised to let us know if symptoms worsen, if she develops fever or if symptoms are not some improved by Friday 11/22.  Pt verbalizes understanding.       Urinary incontinence   Relevant Medications   mirabegron ER (MYRBETRIQ) 50 MG TB24 tablet    I am having Aram Beecham R. Castrogiovanni maintain her aspirin EC, tretinoin, olopatadine, gentamicin cream, clonazePAM, ARIPiprazole, atorvastatin, nitroGLYCERIN, gabapentin, senna-docusate, HYDROcodone-acetaminophen, cetirizine, escitalopram, and mirabegron ER.  Meds ordered this encounter  Medications   DISCONTD: mirabegron ER (MYRBETRIQ) 50 MG TB24 tablet    Sig: Take 1 tablet (50 mg total) by mouth daily.    Dispense:  90 tablet    Refill:  1    Order Specific Question:   Supervising Provider    Answer:   Danise Edge A [[id number]]    mirabegron ER (MYRBETRIQ) 50 MG TB24 tablet    Sig: Take 1 tablet (50 mg total) by mouth daily.    Dispense:  14 tablet    Refill:  0    Order Specific Question:   Supervising Provider    Answer:   Danise Edge A [[id number]]    I discussed the assessment and treatment plan with the patient. The patient was provided an opportunity to ask questions and all were answered. The patient agreed with the plan and demonstrated an understanding of the instructions.   The patient was advised to call back or seek an in-person evaluation if the symptoms worsen or if the condition fails to improve as anticipated.    Lemont Fillers, NP [facility] [phone] (phone) [phone] (fax)  [facility]

## 2023-10-09 NOTE — Assessment & Plan Note (Signed)
New.  Symptoms began 3 days ago.  She is convinced that she has a bacterial sinus infection.  I advised pt that typically symptoms are viral at this point during the infection and that bacterial sinusitis typically does not develop until day 7-10 of the viral infection.  I advised her on some otc options for symptom management but she declines OTC medication. She is agreeable to nasal saline rinses, tylenol prn.  She is advised to let us know if symptoms worsen, if she develops fever or if symptoms are not some improved by Friday 11/22.  Pt verbalizes understanding.

## 2023-10-11 ENCOUNTER — Telehealth: Payer: Medicare Other | Admitting: Family

## 2023-10-11 ENCOUNTER — Other Ambulatory Visit: Payer: Self-pay | Admitting: Family Medicine

## 2023-10-11 DIAGNOSIS — J40 Bronchitis, not specified as acute or chronic: Secondary | ICD-10-CM | POA: Diagnosis not present

## 2023-10-11 MED ORDER — AZITHROMYCIN 250 MG PO TABS: ORAL_TABLET | ORAL | 0 refills | Status: AC

## 2023-10-11 MED ORDER — ALBUTEROL SULFATE HFA 108 (90 BASE) MCG/ACT IN AERS: 2.0000 | INHALATION_SPRAY | Freq: Four times a day (QID) | RESPIRATORY_TRACT | 0 refills | Status: DC | PRN

## 2023-10-11 NOTE — Progress Notes (Signed)
Subjective:     Patient ID: Erin Acosta, female    DOB: March 13, 1951, 72 y.o.   MRN: [id number]  No chief complaint on file.   HPI  Discussed the use of AI scribe software for clinical note transcription with the patient, who gave verbal consent to proceed.  History of Present Illness          Patient is a 72 yr old female who presents today for follow up.  She was seen 11/18 for a then 3 day hx of nasal congestion, presumed viral in etiology. She returns today with c/o worsening symptoms, now moving down inter her chest.  Having trouble breathing through her nose as well as some tightness in her breathing.   Low grade for a few nights, none during the day.      Health Maintenance Due  Topic Date Due   Pneumonia Vaccine 74+ Years old (2 of 2 - PPSV23 or PCV20) 06/02/2017   COVID-19 Vaccine (10 - 2023-24 season) 07/23/2023    Past Medical History:  Diagnosis Date   Allergy    Anxiety    Depression    Genital herpes    H/O cesarean section    Hypertension    Rotator cuff tear    Scoliosis     Past Surgical History:  Procedure Laterality Date   BILATERAL CARPAL TUNNEL RELEASE     BLEPHAROPLASTY     BREAST BIOPSY     unsure which breast   CESAREAN SECTION     FOOT SURGERY Right    mini face lift     TOTAL SHOULDER ARTHROPLASTY Right    [facility]   TRIGGER FINGER RELEASE     tummy tuck      Family History  Problem Relation Age of Onset   Arthritis Other    Hyperlipidemia Other    Heart disease Other    Stroke Other    Hypertension Other     Social History   Socioeconomic History   Marital status: Widowed    Spouse name: Not on file   Number of children: Not on file   Years of education: Not on file   Highest education level: Not on file  Occupational History   Not on file  Tobacco Use   Smoking status: Never   Smokeless tobacco: Never  Vaping Use   Vaping status: Never Used  Substance and Sexual Activity   Alcohol use: Yes     Alcohol/week: 1.0 standard drink of alcohol    Types: 1 Standard drinks or equivalent per week    Comment: Wine in the evening   Drug use: No   Sexual activity: Not on file  Other Topics Concern   Not on file  Social History Narrative   Not on file   Social Determinants of Health   Financial Resource Strain: Low Risk  (07/20/2023)   Overall Financial Resource Strain (CARDIA)    Difficulty of Paying Living Expenses: Not hard at all  Food Insecurity: No Food Insecurity (07/20/2023)   Hunger Vital Sign    Worried About Running Out of Food in the Last Year: Never true    Ran Out of Food in the Last Year: Never true  Transportation Needs: No Transportation Needs (07/20/2023)   PRAPARE - Administrator, Civil Service (Medical): No    Lack of Transportation (Non-Medical): No  Physical Activity: Inactive (07/20/2023)   Exercise Vital Sign    Days of Exercise per Week: 0  days    Minutes of Exercise per Session: 0 min  Stress: No Stress Concern Present (07/20/2023)   [employer] of Occupational Health - Occupational Stress Questionnaire    Feeling of Stress : Only a little  Social Connections: Moderately Isolated (07/20/2023)   Social Connection and Isolation Panel [NHANES]    Frequency of Communication with Friends and Family: Twice a week    Frequency of Social Gatherings with Friends and Family: Twice a week    Attends Religious Services: Never    Database administrator or Organizations: Yes    Attends Banker Meetings: Never    Marital Status: Widowed  Intimate Partner Violence: Not At Risk (07/20/2023)   Humiliation, Afraid, Rape, and Kick questionnaire    Fear of Current or Ex-Partner: No    Emotionally Abused: No    Physically Abused: No    Sexually Abused: No    Outpatient Medications Prior to Visit  Medication Sig Dispense Refill   ARIPiprazole (ABILIFY) 2 MG tablet Take 1 tablet (2 mg total) by mouth daily. 30 tablet 2   aspirin EC 81 MG tablet  Take 81 mg by mouth daily.     atorvastatin (LIPITOR) 40 MG tablet TAKE 1 TABLET DAILY 90 tablet 1   cetirizine (ZYRTEC) 10 MG tablet Take 1 tablet (10 mg total) by mouth daily. 90 tablet 1   clonazePAM (KLONOPIN) 1 MG tablet Take 1 tablet (1 mg total) by mouth 2 (two) times daily as needed. 180 tablet 1   escitalopram (LEXAPRO) 20 MG tablet Take 1 tablet (20 mg total) by mouth daily. 90 tablet 1   gabapentin (NEURONTIN) 600 MG tablet Take 1 tablet (600 mg total) by mouth 2 (two) times daily. Start with 300 mg tablet in the morning (prior dose) and 600 mg at nighttime for 1 week; if tolerating well, increase to 600 mg twice daily. 60 tablet 3   gentamicin cream (GARAMYCIN) 0.1 % Apply 1 Application topically 3 (three) times daily. 15 g 0   HYDROcodone-acetaminophen (NORCO) 10-325 MG tablet Take 1 tablet by mouth 3 (three) times daily as needed for severe pain. 270 tablet 0   mirabegron ER (MYRBETRIQ) 50 MG TB24 tablet Take 1 tablet (50 mg total) by mouth daily. 14 tablet 0   nitroGLYCERIN (NITROSTAT) 0.4 MG SL tablet Place 1 tablet (0.4 mg total) under the tongue every 5 (five) minutes as needed for chest pain. 25 tablet 3   olopatadine (PATADAY) 0.1 % ophthalmic solution Place 1 drop into both eyes 2 (two) times daily. 5 mL 12   senna-docusate (SENOKOT-S) 8.6-50 MG tablet Take 1 tablet by mouth at bedtime. 90 tablet 3   tretinoin (RETIN-A) 0.025 % cream Apply topically at bedtime. 45 g 0   No facility-administered medications prior to visit.    Allergies  Allergen Reactions   Oxycodone-Acetaminophen Anaphylaxis and Itching    Headache, delusion   Nickel Other (See Comments)    Redness and pain    ROS See HPI    Objective:    Physical Exam   There were no vitals taken for this visit.  Gen: Awake, alert, no acute distress ENT: nasal congestion noted Resp: Breathing is even and non-labored Psych: calm/pleasant demeanor Neuro: Alert and Oriented x 3, + facial symmetry, speech is  clear.      Assessment & Plan:   Problem List Items Addressed This Visit       Unprioritized   Bronchitis - Primary  Symptoms started on Saturday with worsening on Sunday. Reports heavy feeling in chest and low-grade fever. Possible viral etiology given the time course, but worsening symptoms warrant a trial of antibiotics. We did discuss that it is still possible that her symptoms are viral and not bacterial at this point.  -Prescribe empiric azithromycin since symptoms are worsening. Rx sent to [facility] on [address]. -Prescribe Albuterol to help open up lungs, sent to [facility] on [address]. -If no improvement in a couple of days, patient should come into the office for a thorough exam.       I am having Erin Acosta start on albuterol and azithromycin. I am also having her maintain her aspirin EC, tretinoin, olopatadine, gentamicin cream, clonazePAM, ARIPiprazole, nitroGLYCERIN, gabapentin, senna-docusate, HYDROcodone-acetaminophen, cetirizine, escitalopram, mirabegron ER, and atorvastatin.  Meds ordered this encounter  Medications   albuterol (VENTOLIN HFA) 108 (90 Base) MCG/ACT inhaler    Sig: Inhale 2 puffs into the lungs every 6 (six) hours as needed for wheezing or shortness of breath.    Dispense:  8 g    Refill:  0    Order Specific Question:   Supervising Provider    Answer:   Danise Edge A [[id number]]   azithromycin (ZITHROMAX) 250 MG tablet    Sig: Take 2 tablets on day 1, then 1 tablet daily on days 2 through 5    Dispense:  6 tablet    Refill:  0    Order Specific Question:   Supervising Provider    Answer:   Danise Edge A [[id number]]

## 2023-10-11 NOTE — Assessment & Plan Note (Signed)
  Symptoms started on Saturday with worsening on Sunday. Reports heavy feeling in chest and low-grade fever. Possible viral etiology given the time course, but worsening symptoms warrant a trial of antibiotics. We did discuss that it is still possible that her symptoms are viral and not bacterial at this point.  -Prescribe empiric azithromycin since symptoms are worsening. Rx sent to [facility] on [address]. -Prescribe Albuterol to help open up lungs, sent to [facility] on [address]. -If no improvement in a couple of days, patient should come into the office for a thorough exam.

## 2023-10-11 NOTE — Patient Instructions (Signed)
VISIT SUMMARY:  You came in today with worsening respiratory symptoms that started on Saturday. You mentioned feeling a heavy sensation in your chest and having difficulty breathing, along with a low-grade fever that comes and goes.  YOUR PLAN:  -RESPIRATORY INFECTION: A respiratory infection is an infection in your lungs or airways. Your symptoms suggest it might be viral, but because they are getting worse, we are starting you on antibiotics. We have sent a prescription for antibiotics and Albuterol to [facility] on [address]. Albuterol will help open up your lungs and make it easier to breathe. If you do not feel better in a couple of days, please come back to the office for a more detailed examination.  INSTRUCTIONS:  Please pick up your prescriptions from [facility] on [address]. If your symptoms do not improve in a couple of days, schedule an appointment to come back to the office for a thorough exam.

## 2023-11-02 DIAGNOSIS — Z01810 Encounter for preprocedural cardiovascular examination: Secondary | ICD-10-CM | POA: Diagnosis not present

## 2023-11-02 DIAGNOSIS — M5416 Radiculopathy, lumbar region: Secondary | ICD-10-CM | POA: Diagnosis not present

## 2023-11-02 DIAGNOSIS — Z01812 Encounter for preprocedural laboratory examination: Secondary | ICD-10-CM | POA: Diagnosis not present

## 2023-11-02 DIAGNOSIS — M48061 Spinal stenosis, lumbar region without neurogenic claudication: Secondary | ICD-10-CM | POA: Diagnosis not present

## 2023-11-02 DIAGNOSIS — M4316 Spondylolisthesis, lumbar region: Secondary | ICD-10-CM | POA: Diagnosis not present

## 2023-11-20 ENCOUNTER — Encounter: Payer: Medicare Other | Admitting: Registered Nurse

## 2023-11-20 ENCOUNTER — Other Ambulatory Visit: Payer: Self-pay | Admitting: Physical Medicine and Rehabilitation

## 2023-11-21 ENCOUNTER — Encounter: Payer: Self-pay | Admitting: Registered Nurse

## 2023-11-21 ENCOUNTER — Encounter: Payer: Medicare Other | Attending: Physical Medicine and Rehabilitation | Admitting: Registered Nurse

## 2023-11-21 VITALS — BP 146/85 | HR 61 | Ht 60.0 in | Wt 154.0 lb

## 2023-11-21 DIAGNOSIS — Z5181 Encounter for therapeutic drug level monitoring: Secondary | ICD-10-CM | POA: Diagnosis not present

## 2023-11-21 DIAGNOSIS — G894 Chronic pain syndrome: Secondary | ICD-10-CM | POA: Insufficient documentation

## 2023-11-21 DIAGNOSIS — M5416 Radiculopathy, lumbar region: Secondary | ICD-10-CM | POA: Insufficient documentation

## 2023-11-21 DIAGNOSIS — Z79891 Long term (current) use of opiate analgesic: Secondary | ICD-10-CM | POA: Diagnosis not present

## 2023-11-21 MED ORDER — HYDROCODONE-ACETAMINOPHEN 10-325 MG PO TABS: 1.0000 | ORAL_TABLET | Freq: Three times a day (TID) | ORAL | 0 refills | Status: DC | PRN

## 2023-11-21 NOTE — Progress Notes (Signed)
 Subjective:    Patient ID: Erin Acosta, female    DOB: [birth year], 72 y.o.   MRN: [id number]  HPI: Erin Acosta is a 72 y.o. female who returns for follow up appointment for chronic pain and medication refill. She states her pain is located in her lower back radiating into her bilateral lower extremities. . She rates her pain 8. Her current exercise regime is walking and performing stretching exercises.  Ms. Benedict Morphine equivalent is 30.00 MME.     Pain Inventory Average Pain 6 Pain Right Now 8 My pain is dull  In the last 24 hours, has pain interfered with the following? General activity 2 Relation with others 0 Enjoyment of life 4 What TIME of day is your pain at its worst? morning  Sleep (in general) Fair  Pain is worse with: bending and standing Pain improves with: rest, therapy/exercise, and medication Relief from Meds: 7  Family History  Problem Relation Age of Onset   Arthritis Other    Hyperlipidemia Other    Heart disease Other    Stroke Other    Hypertension Other    Social History   Socioeconomic History   Marital status: Widowed    Spouse name: Not on file   Number of children: Not on file   Years of education: Not on file   Highest education level: Not on file  Occupational History   Not on file  Tobacco Use   Smoking status: Never   Smokeless tobacco: Never  Vaping Use   Vaping status: Never Used  Substance and Sexual Activity   Alcohol use: Yes    Alcohol/week: 1.0 standard drink of alcohol    Types: 1 Standard drinks or equivalent per week    Comment: Wine in the evening   Drug use: No   Sexual activity: Not on file  Other Topics Concern   Not on file  Social History Narrative   Not on file   Social Drivers of Health   Financial Resource Strain: Low Risk  (07/20/2023)   Overall Financial Resource Strain (CARDIA)    Difficulty of Paying Living Expenses: Not hard at all  Food Insecurity: No Food Insecurity (07/20/2023)   Hunger  Vital Sign    Worried About Running Out of Food in the Last Year: Never true    Ran Out of Food in the Last Year: Never true  Transportation Needs: No Transportation Needs (07/20/2023)   PRAPARE - Administrator, Civil Service (Medical): No    Lack of Transportation (Non-Medical): No  Physical Activity: Inactive (07/20/2023)   Exercise Vital Sign    Days of Exercise per Week: 0 days    Minutes of Exercise per Session: 0 min  Stress: No Stress Concern Present (07/20/2023)   [employer] of Occupational Health - Occupational Stress Questionnaire    Feeling of Stress : Only a little  Social Connections: Moderately Isolated (07/20/2023)   Social Connection and Isolation Panel [NHANES]    Frequency of Communication with Friends and Family: Twice a week    Frequency of Social Gatherings with Friends and Family: Twice a week    Attends Religious Services: Never    Database Administrator or Organizations: Yes    Attends Banker Meetings: Never    Marital Status: Widowed   Past Surgical History:  Procedure Laterality Date   BILATERAL CARPAL TUNNEL RELEASE     BLEPHAROPLASTY     BREAST BIOPSY  unsure which breast   CESAREAN SECTION     FOOT SURGERY Right    mini face lift     TOTAL SHOULDER ARTHROPLASTY Right    [facility]   TRIGGER FINGER RELEASE     tummy tuck     Past Surgical History:  Procedure Laterality Date   BILATERAL CARPAL TUNNEL RELEASE     BLEPHAROPLASTY     BREAST BIOPSY     unsure which breast   CESAREAN SECTION     FOOT SURGERY Right    mini face lift     TOTAL SHOULDER ARTHROPLASTY Right    [facility]   TRIGGER FINGER RELEASE     tummy tuck     Past Medical History:  Diagnosis Date   Allergy    Anxiety    Depression    Genital herpes    H/O cesarean section    Hypertension    Rotator cuff tear    Scoliosis    BP (!) 146/85   Pulse 61   Ht 5' (1.524 m)   Wt 154 lb (69.9 kg)   SpO2 98%   BMI 30.08 kg/m   Opioid  Risk Score:   Fall Risk Score:  `1  Depression screen [facility] 2/9     08/21/2023    1:07 PM 07/20/2023   11:25 AM 05/17/2023   11:25 AM 02/13/2023    2:52 PM 10/05/2022   11:28 AM 09/05/2022   10:45 AM 10/08/2021    2:40 PM  Depression screen PHQ 2/9  Decreased Interest 0 0 1 0 0 0 0  Down, Depressed, Hopeless 0 0 1 0 0 0 0  PHQ - 2 Score 0 0 2 0 0 0 0  Altered sleeping      1   Tired, decreased energy      1   Change in appetite      0   Feeling bad or failure about yourself       0   Trouble concentrating      0   Moving slowly or fidgety/restless      0   Suicidal thoughts      0   PHQ-9 Score      2      Review of Systems  Musculoskeletal:  Positive for back pain.       Leg pain  All other systems reviewed and are negative.     Objective:   Physical Exam Vitals and nursing note reviewed.  Constitutional:      Appearance: Normal appearance.  Cardiovascular:     Rate and Rhythm: Normal rate and regular rhythm.     Pulses: Normal pulses.     Heart sounds: Normal heart sounds.  Pulmonary:     Effort: Pulmonary effort is normal.     Breath sounds: Normal breath sounds.  Musculoskeletal:     Comments: Normal Muscle Bulk and Muscle Testing Reveals:  Upper Extremities: Full ROM and Muscle Strength 5/5  Lower Extremities: Full ROM and Muscle Strength 5/5 Arises from Chair with ease Narrow based  Gait     Skin:    General: Skin is warm and dry.  Neurological:     Mental Status: She is alert and oriented to person, place, and time.  Psychiatric:        Mood and Affect: Mood normal.        Behavior: Behavior normal.         Assessment & Plan:   Lumbar  Radiculitis: Continue HEP as Tolerated. Continue to Monitor. 11/21/2023 Chronic Right Hip/ OA: No complaints today. Continue HEP as Tolerated. Continue to Monitor. 11/21/2023 Chronic Pain Syndrome: Refilled: Hydrocodone  10/325 mg one tablet three times a day as needed for [pain # 90. Second script sent for the  following month.  We will continue the opioid monitoring program, this consists of regular clinic visits, examinations, urine drug screen, pill counts as well as use of [place]  Controlled Substance Reporting system. A 12 month History has been reviewed on the [place]  Controlled Substance Reporting System on 11/21/2023,   F/U in 2 months

## 2023-12-13 ENCOUNTER — Telehealth: Payer: Self-pay

## 2023-12-13 NOTE — Telephone Encounter (Signed)
Erin Acosta has requested to speak to [facility] NP about the Hydrocodone 10-325 MG. Erin Acosta would prefer to have a 3 months supply sent to the pharmacy. She appears to be debating between the local pharmacy & mail order. But would like to speak with Erin Lam NP, first.   Call back phone [phone].

## 2023-12-14 NOTE — Telephone Encounter (Signed)
Return Ms [facility] Call,  No answer. Left message to return the call.

## 2023-12-21 ENCOUNTER — Telehealth: Payer: Self-pay

## 2023-12-21 ENCOUNTER — Other Ambulatory Visit: Payer: Self-pay

## 2023-12-21 NOTE — Telephone Encounter (Signed)
Erin Acosta has requested future refill to be for a 90 day supply. Also please send to the "mail order," [facility].

## 2023-12-21 NOTE — Telephone Encounter (Signed)
I don't mind that, but I would caution that I have consistent issues with [facility] getting controlled medications to patients on time based on supply issues. She will need to reach out to [place] if she runs out of medication before they can deliver it to her, to bridge the gap with a local pharmacy.

## 2023-12-25 DIAGNOSIS — M5115 Intervertebral disc disorders with radiculopathy, thoracolumbar region: Secondary | ICD-10-CM | POA: Diagnosis not present

## 2023-12-25 DIAGNOSIS — M4808 Spinal stenosis, sacral and sacrococcygeal region: Secondary | ICD-10-CM | POA: Diagnosis not present

## 2023-12-25 DIAGNOSIS — M419 Scoliosis, unspecified: Secondary | ICD-10-CM | POA: Diagnosis not present

## 2023-12-25 DIAGNOSIS — M5416 Radiculopathy, lumbar region: Secondary | ICD-10-CM | POA: Diagnosis not present

## 2023-12-25 DIAGNOSIS — M4316 Spondylolisthesis, lumbar region: Secondary | ICD-10-CM | POA: Diagnosis not present

## 2023-12-25 DIAGNOSIS — M4319 Spondylolisthesis, multiple sites in spine: Secondary | ICD-10-CM | POA: Diagnosis not present

## 2023-12-25 DIAGNOSIS — M48061 Spinal stenosis, lumbar region without neurogenic claudication: Secondary | ICD-10-CM | POA: Diagnosis not present

## 2024-01-22 NOTE — Progress Notes (Unsigned)
 Subjective:    Patient ID: Erin Acosta, female    DOB: Jul 13, 1951, 73 y.o.   MRN: [id number]  HPI: Erin Acosta is a 73 y.o. female who returns for follow up appointment for chronic pain and medication refill. She states her pain is located in her lower back, she reports she is only receiving 4 hours of relief with her current medication regimen. She rates her pain 3. Her current exercise regime is walking and performing stretching exercises.    [name] equivalent is 30.00 MME.   Last UDS was Performed on 11/03/2023, sheis also prescribed Clonazepam  by Dr. Almeta Monas .We have discussed the black box warning of using opioids and benzodiazepines. I highlighted the dangers of using these drugs together and discussed the adverse events including respiratory suppression, overdose, cognitive impairment and importance of compliance with current regimen. We will continue to monitor and adjust as indicated.    Pain Inventory Average Pain 6 Pain Right Now 3 My pain is intermittent, sharp, and aching  In the last 24 hours, has pain interfered with the following? General activity 0 Relation with others 0 Enjoyment of life 0 What TIME of day is your pain at its worst? varies Sleep (in general) Poor  Pain is worse with: standing Pain improves with: medication Relief from Meds: 9  Family History  Problem Relation Age of Onset   Arthritis Other    Hyperlipidemia Other    Heart disease Other    Stroke Other    Hypertension Other    Social History   Socioeconomic History   Marital status: Widowed    Spouse name: Not on file   Number of children: Not on file   Years of education: Not on file   Highest education level: Not on file  Occupational History   Not on file  Tobacco Use   Smoking status: Never   Smokeless tobacco: Never  Vaping Use   Vaping status: Never Used  Substance and Sexual Activity   Alcohol use: Yes    Alcohol/week: 1.0 standard drink of alcohol     Types: 1 Standard drinks or equivalent per week    Comment: Wine in the evening   Drug use: No   Sexual activity: Not on file  Other Topics Concern   Not on file  Social History Narrative   Not on file   Social Drivers of Health   Financial Resource Strain: Low Risk  (07/20/2023)   Overall Financial Resource Strain (CARDIA)    Difficulty of Paying Living Expenses: Not hard at all  Food Insecurity: No Food Insecurity (07/20/2023)   Hunger Vital Sign    Worried About Running Out of Food in the Last Year: Never true    Ran Out of Food in the Last Year: Never true  Transportation Needs: No Transportation Needs (07/20/2023)   PRAPARE - Administrator, Civil Service (Medical): No    Lack of Transportation (Non-Medical): No  Physical Activity: Inactive (07/20/2023)   Exercise Vital Sign    Days of Exercise per Week: 0 days    Minutes of Exercise per Session: 0 min  Stress: No Stress Concern Present (07/20/2023)   [employer] of Occupational Health - Occupational Stress Questionnaire    Feeling of Stress : Only a little  Social Connections: Moderately Isolated (07/20/2023)   Social Connection and Isolation Panel [NHANES]    Frequency of Communication with Friends and Family: Twice a week    Frequency of  Social Gatherings with Friends and Family: Twice a week    Attends Religious Services: Never    Database administrator or Organizations: Yes    Attends Banker Meetings: Never    Marital Status: Widowed   Past Surgical History:  Procedure Laterality Date   BILATERAL CARPAL TUNNEL RELEASE     BLEPHAROPLASTY     BREAST BIOPSY     unsure which breast   CESAREAN SECTION     FOOT SURGERY Right    mini face lift     TOTAL SHOULDER ARTHROPLASTY Right    [facility]   TRIGGER FINGER RELEASE     tummy tuck     Past Surgical History:  Procedure Laterality Date   BILATERAL CARPAL TUNNEL RELEASE     BLEPHAROPLASTY     BREAST BIOPSY     unsure which breast    CESAREAN SECTION     FOOT SURGERY Right    mini face lift     TOTAL SHOULDER ARTHROPLASTY Right    [facility]   TRIGGER FINGER RELEASE     tummy tuck     Past Medical History:  Diagnosis Date   Allergy    Anxiety    Depression    Genital herpes    H/O cesarean section    Hypertension    Rotator cuff tear    Scoliosis    There were no vitals taken for this visit.  Opioid Risk Score:   Fall Risk Score:  `1  Depression screen [facility] 2/9     08/21/2023    1:07 PM 07/20/2023   11:25 AM 05/17/2023   11:25 AM 02/13/2023    2:52 PM 10/05/2022   11:28 AM 09/05/2022   10:45 AM 10/08/2021    2:40 PM  Depression screen PHQ 2/9  Decreased Interest 0 0 1 0 0 0 0  Down, Depressed, Hopeless 0 0 1 0 0 0 0  PHQ - 2 Score 0 0 2 0 0 0 0  Altered sleeping      1   Tired, decreased energy      1   Change in appetite      0   Feeling bad or failure about yourself       0   Trouble concentrating      0   Moving slowly or fidgety/restless      0   Suicidal thoughts      0   PHQ-9 Score      2     Review of Systems  Musculoskeletal:  Positive for back pain.       Pain in both legs  All other systems reviewed and are negative.      Objective:   Physical Exam Vitals and nursing note reviewed.  Constitutional:      Appearance: Normal appearance.  Cardiovascular:     Rate and Rhythm: Normal rate and regular rhythm.     Pulses: Normal pulses.     Heart sounds: Normal heart sounds.  Pulmonary:     Effort: Pulmonary effort is normal.     Breath sounds: Normal breath sounds.  Musculoskeletal:     Comments: Normal Muscle Bulk and Muscle Testing Reveals:  Upper Extremities: Full ROM and Muscle Strength 5/5  Lumbar Paraspinal Tenderness: L-4-L-5 Lower Extremities: Full ROM and Muscle Strength 5/5 Arises from chair with ease Narrow Based  Gait     Skin:    General: Skin is warm and dry.  Neurological:     Mental Status: She is alert and oriented to person, place, and time.   Psychiatric:        Mood and Affect: Mood normal.        Behavior: Behavior normal.         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 Continue to Monitor. 01/23/2024 Chronic Right Hip/ OA: No complaints today. Continue HEP as Tolerated. Continue to Monitor. 01/23/2024 Chronic Pain Syndrome: Refilled: Increased : Hydrocodone 10/325 mg one tablet four times a day as needed for [pain # 110. Second script sent for the following month.  We will continue the opioid monitoring program, this consists of regular clinic visits, examinations, urine drug screen, pill counts as well as use of [place] Controlled Substance Reporting system. A 12 month History has been reviewed on the [place] Controlled Substance Reporting System on 01/23/2024,    F/U in 2 months

## 2024-01-23 ENCOUNTER — Encounter: Payer: Self-pay | Admitting: Registered Nurse

## 2024-01-23 ENCOUNTER — Encounter: Payer: Medicare Other | Attending: Physical Medicine and Rehabilitation | Admitting: Registered Nurse

## 2024-01-23 VITALS — BP 106/73 | HR 67 | Wt 155.0 lb

## 2024-01-23 DIAGNOSIS — Z79891 Long term (current) use of opiate analgesic: Secondary | ICD-10-CM | POA: Insufficient documentation

## 2024-01-23 DIAGNOSIS — G894 Chronic pain syndrome: Secondary | ICD-10-CM | POA: Diagnosis not present

## 2024-01-23 DIAGNOSIS — M545 Low back pain, unspecified: Secondary | ICD-10-CM | POA: Diagnosis not present

## 2024-01-23 DIAGNOSIS — Z5181 Encounter for therapeutic drug level monitoring: Secondary | ICD-10-CM | POA: Diagnosis not present

## 2024-01-23 DIAGNOSIS — G8929 Other chronic pain: Secondary | ICD-10-CM | POA: Diagnosis not present

## 2024-01-23 MED ORDER — GABAPENTIN 300 MG PO CAPS: 600.0000 mg | ORAL_CAPSULE | Freq: Two times a day (BID) | ORAL | 1 refills | Status: DC

## 2024-01-23 MED ORDER — HYDROCODONE-ACETAMINOPHEN 10-325 MG PO TABS: 1.0000 | ORAL_TABLET | Freq: Four times a day (QID) | ORAL | 0 refills | Status: DC | PRN

## 2024-01-29 LAB — DRUG TOX MONITOR 1 W/CONF, ORAL FLD
Alprazolam: NEGATIVE ng/mL (ref <0.50)
Aminoclonazepam: 1.42 ng/mL — ABNORMAL HIGH (ref <0.50)
Amphetamines: NEGATIVE ng/mL (ref <10)
Barbiturates: NEGATIVE ng/mL (ref <10)
Benzodiazepines: POSITIVE ng/mL — AB (ref <0.50)
Buprenorphine: NEGATIVE ng/mL (ref <0.10)
Chlordiazepoxide: NEGATIVE ng/mL (ref <0.50)
Clonazepam: 1.06 ng/mL — ABNORMAL HIGH (ref <0.50)
Cocaine: NEGATIVE ng/mL (ref <5.0)
Codeine: NEGATIVE ng/mL (ref <2.5)
Diazepam: NEGATIVE ng/mL (ref <0.50)
Dihydrocodeine: 5.9 ng/mL — ABNORMAL HIGH (ref <2.5)
Fentanyl: NEGATIVE ng/mL (ref <0.10)
Flunitrazepam: NEGATIVE ng/mL (ref <0.50)
Flurazepam: NEGATIVE ng/mL (ref <0.50)
Heroin Metabolite: NEGATIVE ng/mL (ref <1.0)
Hydrocodone: 69.5 ng/mL — ABNORMAL HIGH (ref <2.5)
Hydromorphone: NEGATIVE ng/mL (ref <2.5)
Lorazepam: NEGATIVE ng/mL (ref <0.50)
MARIJUANA: NEGATIVE ng/mL (ref <2.5)
MDMA: NEGATIVE ng/mL (ref <10)
Meprobamate: NEGATIVE ng/mL (ref <2.5)
Methadone: NEGATIVE ng/mL (ref <5.0)
Midazolam: NEGATIVE ng/mL (ref <0.50)
Morphine: NEGATIVE ng/mL (ref <2.5)
Nicotine Metabolite: NEGATIVE ng/mL (ref <5.0)
Nordiazepam: NEGATIVE ng/mL (ref <0.50)
Norhydrocodone: NEGATIVE ng/mL (ref <2.5)
Noroxycodone: NEGATIVE ng/mL (ref <2.5)
Opiates: POSITIVE ng/mL — AB (ref <2.5)
Oxazepam: NEGATIVE ng/mL (ref <0.50)
Oxycodone: NEGATIVE ng/mL (ref <2.5)
Oxymorphone: NEGATIVE ng/mL (ref <2.5)
Phencyclidine: NEGATIVE ng/mL (ref <10)
Tapentadol: NEGATIVE ng/mL (ref <5.0)
Temazepam: NEGATIVE ng/mL (ref <0.50)
Tramadol: NEGATIVE ng/mL (ref <5.0)
Triazolam: NEGATIVE ng/mL (ref <0.50)
Zolpidem: NEGATIVE ng/mL (ref <5.0)

## 2024-01-29 LAB — DRUG TOX ALC METAB W/CON, ORAL FLD: Alcohol Metabolite: NEGATIVE ng/mL (ref <25)

## 2024-02-12 ENCOUNTER — Other Ambulatory Visit: Payer: Self-pay | Admitting: Family Medicine

## 2024-02-12 DIAGNOSIS — F419 Anxiety disorder, unspecified: Secondary | ICD-10-CM

## 2024-02-26 ENCOUNTER — Other Ambulatory Visit: Payer: Self-pay | Admitting: Family Medicine

## 2024-02-27 ENCOUNTER — Telehealth: Payer: Self-pay | Admitting: Registered Nurse

## 2024-02-27 NOTE — Telephone Encounter (Signed)
 Patient left voice mail requesting Hydrocodone refill to be sent to  [facility], [address].

## 2024-02-28 NOTE — Telephone Encounter (Signed)
 Return Erin Acosta ,  PMP was Reviewed. Her Hydrocodone prescription was sent on 01/23/2024, with a do not fill date on it.  Acosta placed to Erin Acosta regarding the above, she was instructed to Acosta her pharmacy. She verbalizes understanding.

## 2024-03-01 DIAGNOSIS — I10 Essential (primary) hypertension: Secondary | ICD-10-CM | POA: Diagnosis not present

## 2024-03-01 DIAGNOSIS — E789 Disorder of lipoprotein metabolism, unspecified: Secondary | ICD-10-CM | POA: Diagnosis not present

## 2024-03-01 DIAGNOSIS — I2510 Atherosclerotic heart disease of native coronary artery without angina pectoris: Secondary | ICD-10-CM | POA: Diagnosis not present

## 2024-03-01 DIAGNOSIS — R079 Chest pain, unspecified: Secondary | ICD-10-CM | POA: Diagnosis not present

## 2024-03-01 DIAGNOSIS — I771 Stricture of artery: Secondary | ICD-10-CM | POA: Diagnosis not present

## 2024-03-04 ENCOUNTER — Other Ambulatory Visit: Payer: Self-pay | Admitting: Family Medicine

## 2024-03-04 DIAGNOSIS — R079 Chest pain, unspecified: Secondary | ICD-10-CM | POA: Diagnosis not present

## 2024-03-14 ENCOUNTER — Ambulatory Visit: Admitting: Family Medicine

## 2024-03-15 ENCOUNTER — Other Ambulatory Visit: Payer: Self-pay | Admitting: Family

## 2024-03-15 ENCOUNTER — Ambulatory Visit (INDEPENDENT_AMBULATORY_CARE_PROVIDER_SITE_OTHER): Admitting: Family Medicine

## 2024-03-15 ENCOUNTER — Telehealth: Payer: Self-pay | Admitting: Family Medicine

## 2024-03-15 ENCOUNTER — Encounter: Payer: Self-pay | Admitting: Family Medicine

## 2024-03-15 VITALS — BP 122/78 | HR 68 | Temp 98.0°F | Resp 18 | Ht 60.0 in | Wt 155.2 lb

## 2024-03-15 DIAGNOSIS — I10 Essential (primary) hypertension: Secondary | ICD-10-CM

## 2024-03-15 DIAGNOSIS — Z23 Encounter for immunization: Secondary | ICD-10-CM | POA: Diagnosis not present

## 2024-03-15 DIAGNOSIS — F411 Generalized anxiety disorder: Secondary | ICD-10-CM

## 2024-03-15 DIAGNOSIS — E785 Hyperlipidemia, unspecified: Secondary | ICD-10-CM | POA: Diagnosis not present

## 2024-03-15 DIAGNOSIS — R32 Unspecified urinary incontinence: Secondary | ICD-10-CM

## 2024-03-15 DIAGNOSIS — R739 Hyperglycemia, unspecified: Secondary | ICD-10-CM

## 2024-03-15 MED ORDER — CLONAZEPAM 1 MG PO TABS
1.0000 mg | ORAL_TABLET | Freq: Two times a day (BID) | ORAL | 1 refills | Status: AC | PRN
Start: 2024-03-15 — End: ?

## 2024-03-15 NOTE — Telephone Encounter (Signed)
 Copied from CRM [id number]. Topic: Clinical - Prescription Issue >> Mar 15, 2024  3:41 PM Chuck Crater wrote: Reason for CRM: Erin Acosta with [facility] is calling regarding patient clonazePAM  (KLONOPIN ) 1 MG tablet. He wants to speak to the prescriber in regards to patient taking clonazePAM  (KLONOPIN ) 1 MG tablet and HYDROcodone -acetaminophen  (NORCO) 10-325 MG tablet combination. Invoice number: [id number]

## 2024-03-15 NOTE — Assessment & Plan Note (Signed)
 Well controlled, no changes to meds. Encouraged heart healthy diet such as the DASH diet and exercise as tolerated.

## 2024-03-15 NOTE — Progress Notes (Signed)
 `  Established Patient Office Visit  Subjective   Patient ID: Erin Acosta, female    DOB: January 31, 1951  Age: 73 y.o. MRN: [id number]  Chief Complaint  Patient presents with   Labs    Pt states Cardiologist is requesting a "full panel" prior to her stress test and would like pt to discuss an appetite supplement.     HPI Discussed the use of AI scribe software for clinical note transcription with the patient, who gave verbal consent to proceed.  History of Present Illness Erin Acosta is a 73 year old female with coronary artery disease who presents with shortness of breath.  She experiences shortness of breath, which she associates with recent weight gain. She recalls a previous episode of similar symptoms that required stent placement, though she notes the current episode is less severe.  She has gained a significant amount of weight and is considering weight loss medications. She is concerned about the impact of her weight on her heart and shortness of breath. She is exploring options for weight loss medications that are heart-friendly and covered by her insurance, which includes Medicare and Tricare for life.  She has a history of back issues and was scheduled for back surgery, but after reviewing her MRI, the surgeon advised against it due to the extent of damage. Surgery might help her legs but not her back, and could potentially worsen her condition. She manages her pain with hydrocodone , which she takes two to three times a day, depending on her activity level.  She has a disability tag for her car due to her mobility issues and sometimes uses a cane. She is considering applying for a permanent disability license plate to avoid the hassle of using a temporary tag.  She recently received a pneumonia vaccine and has kept up with her COVID vaccinations. She is aware of the recent outbreak of norovirus on a cruise ship and takes precautions such as carrying hand  sanitizer.   Patient Active Problem List   Diagnosis Date Noted   Viral URI 10/09/2023   Osteoarthritis of right hip 05/26/2023   RLQ abdominal pain 05/19/2023   Constipation due to opioid therapy 05/19/2023   Encounter for long-term use of opiate analgesic 05/17/2023   Encounter for medication monitoring 05/17/2023   Chronic pain syndrome 02/19/2023   Right lumbar radiculitis 02/19/2023   Pre-op examination 12/04/2022   Bilateral sciatica 09/05/2022   Hamstring tightness of right lower extremity 09/05/2022   Chronic right hip pain 09/05/2022   Spinal stenosis of lumbar region 04/07/2022   Stress incontinence of urine 01/11/2022   Bronchitis 05/18/2021   Bruising 07/15/2020   Urinary incontinence 04/21/2020   Overweight (BMI 25.0-29.9) 12/12/2019   Allergies 11/05/2018   Depression with anxiety 08/24/2017   Hyperlipidemia 05/08/2017   Pulmonary nodules 08/25/2016   Generalized anxiety disorder 03/27/2016   Right rotator cuff tear 03/27/2016   Stenosis of subclavian artery (HCC) 03/10/2016   Disease of lipid metabolism 03/10/2016   Essential hypertension 03/10/2016   Chest pain 03/10/2016   Past Medical History:  Diagnosis Date   Allergy    Anxiety    Depression    Genital herpes    H/O cesarean section    Hypertension    Rotator cuff tear    Scoliosis    Past Surgical History:  Procedure Laterality Date   BILATERAL CARPAL TUNNEL RELEASE     BLEPHAROPLASTY     BREAST BIOPSY     unsure which breast  CESAREAN SECTION     FOOT SURGERY Right    mini face lift     TOTAL SHOULDER ARTHROPLASTY Right    [facility]   TRIGGER FINGER RELEASE     tummy tuck     Social History   Tobacco Use   Smoking status: Never   Smokeless tobacco: Never  Vaping Use   Vaping status: Never Used  Substance Use Topics   Alcohol use: Yes    Alcohol/week: 1.0 standard drink of alcohol    Types: 1 Standard drinks or equivalent per week    Comment: Wine in the evening   Drug  use: No   Social History   Socioeconomic History   Marital status: Widowed    Spouse name: Not on file   Number of children: Not on file   Years of education: Not on file   Highest education level: Not on file  Occupational History   Not on file  Tobacco Use   Smoking status: Never   Smokeless tobacco: Never  Vaping Use   Vaping status: Never Used  Substance and Sexual Activity   Alcohol use: Yes    Alcohol/week: 1.0 standard drink of alcohol    Types: 1 Standard drinks or equivalent per week    Comment: Wine in the evening   Drug use: No   Sexual activity: Not on file  Other Topics Concern   Not on file  Social History Narrative   Not on file   Social Drivers of Health   Financial Resource Strain: Low Risk  (07/20/2023)   Overall Financial Resource Strain (CARDIA)    Difficulty of Paying Living Expenses: Not hard at all  Food Insecurity: No Food Insecurity (07/20/2023)   Hunger Vital Sign    Worried About Running Out of Food in the Last Year: Never true    Ran Out of Food in the Last Year: Never true  Transportation Needs: No Transportation Needs (07/20/2023)   PRAPARE - Administrator, Civil Service (Medical): No    Lack of Transportation (Non-Medical): No  Physical Activity: Inactive (07/20/2023)   Exercise Vital Sign    Days of Exercise per Week: 0 days    Minutes of Exercise per Session: 0 min  Stress: No Stress Concern Present (07/20/2023)   [employer] of Occupational Health - Occupational Stress Questionnaire    Feeling of Stress : Only a little  Social Connections: Moderately Isolated (07/20/2023)   Social Connection and Isolation Panel [NHANES]    Frequency of Communication with Friends and Family: Twice a week    Frequency of Social Gatherings with Friends and Family: Twice a week    Attends Religious Services: Never    Database administrator or Organizations: Yes    Attends Banker Meetings: Never    Marital Status: Widowed   Intimate Partner Violence: Not At Risk (07/20/2023)   Humiliation, Afraid, Rape, and Kick questionnaire    Fear of Current or Ex-Partner: No    Emotionally Abused: No    Physically Abused: No    Sexually Abused: No   Family Status  Relation Name Status   Mother  Deceased   Father  Deceased   Other  (Not Specified)   Other  (Not Specified)   Other  (Not Specified)   Other  (Not Specified)   Other  (Not Specified)  No partnership data on file   Family History  Problem Relation Age of Onset   Arthritis Other  Hyperlipidemia Other    Heart disease Other    Stroke Other    Hypertension Other    Allergies  Allergen Reactions   Oxycodone -Acetaminophen  Anaphylaxis and Itching    Headache, delusion  Delusions   Dust Mite Extract     Other Reaction(s): Other (See Comments)  sneezing   Nickel Other (See Comments)    Redness and pain   Oxycodone  Other (See Comments)    Other Reaction(s): Psychosis      Review of Systems  Constitutional:  Negative for chills, fever and malaise/fatigue.  HENT:  Negative for congestion and hearing loss.   Eyes:  Negative for discharge.  Respiratory:  Negative for cough, sputum production and shortness of breath.   Cardiovascular:  Negative for chest pain, palpitations and leg swelling.  Gastrointestinal:  Negative for abdominal pain, blood in stool, constipation, diarrhea, heartburn, nausea and vomiting.  Genitourinary:  Negative for dysuria, frequency, hematuria and urgency.  Musculoskeletal:  Negative for back pain, falls and myalgias.  Skin:  Negative for rash.  Neurological:  Negative for dizziness, sensory change, loss of consciousness, weakness and headaches.  Endo/Heme/Allergies:  Negative for environmental allergies. Does not bruise/bleed easily.  Psychiatric/Behavioral:  Negative for depression and suicidal ideas. The patient is not nervous/anxious and does not have insomnia.       Objective:     BP 122/78 (BP Location:  Right Arm, Patient Position: Sitting, Cuff Size: Large)   Pulse 68   Temp 98 F (36.7 C) (Oral)   Resp 18   Ht 5' (1.524 m)   Wt 155 lb 3.2 oz (70.4 kg)   SpO2 97%   BMI 30.31 kg/m  BP Readings from Last 3 Encounters:  03/15/24 122/78  01/23/24 106/73  11/21/23 (!) 146/85   Wt Readings from Last 3 Encounters:  03/15/24 155 lb 3.2 oz (70.4 kg)  01/23/24 155 lb (70.3 kg)  11/21/23 154 lb (69.9 kg)   SpO2 Readings from Last 3 Encounters:  03/15/24 97%  01/23/24 96%  11/21/23 98%      Physical Exam Vitals and nursing note reviewed.  Constitutional:      General: She is not in acute distress.    Appearance: Normal appearance. She is well-developed.  HENT:     Head: Normocephalic and atraumatic.  Eyes:     General: No scleral icterus.       Right eye: No discharge.        Left eye: No discharge.  Cardiovascular:     Rate and Rhythm: Normal rate and regular rhythm.     Heart sounds: No murmur heard. Pulmonary:     Effort: Pulmonary effort is normal. No respiratory distress.     Breath sounds: Normal breath sounds.  Musculoskeletal:        General: Normal range of motion.     Cervical back: Normal range of motion and neck supple.     Right lower leg: No edema.     Left lower leg: No edema.  Skin:    General: Skin is warm and dry.  Neurological:     Mental Status: She is alert and oriented to person, place, and time.  Psychiatric:        Mood and Affect: Mood normal.        Behavior: Behavior normal.        Thought Content: Thought content normal.        Judgment: Judgment normal.      No results found for any visits on  03/15/24.  Last CBC Lab Results  Component Value Date   WBC 7.0 05/19/2023   HGB 14.5 05/19/2023   HCT 43.8 05/19/2023   MCV 85.9 05/19/2023   RDW 15.1 05/19/2023   PLT 242.0 05/19/2023   Last metabolic panel Lab Results  Component Value Date   GLUCOSE 85 05/19/2023   NA 139 05/19/2023   K 4.4 05/19/2023   CL 106 05/19/2023   CO2  28 05/19/2023   BUN 11 05/19/2023   CREATININE 0.49 05/19/2023   GFR 94.27 05/19/2023   CALCIUM  9.9 05/19/2023   PROT 6.5 05/19/2023   ALBUMIN 4.3 05/19/2023   BILITOT 0.5 05/19/2023   ALKPHOS 85 05/19/2023   AST 15 05/19/2023   ALT 14 05/19/2023   Last lipids Lab Results  Component Value Date   CHOL 124 03/23/2023   HDL 57.00 03/23/2023   LDLCALC 51 03/23/2023   LDLDIRECT 73.0 04/21/2020   TRIG 81.0 03/23/2023   CHOLHDL 2 03/23/2023   Last hemoglobin A1c Lab Results  Component Value Date   HGBA1C 5.8 03/23/2023   Last thyroid  functions Lab Results  Component Value Date   TSH 1.43 08/24/2017   Last vitamin D No results found for: "25OHVITD2", "25OHVITD3", "VD25OH" Last vitamin B12 and Folate No results found for: "VITAMINB12", "FOLATE"    The ASCVD Risk score (Arnett DK, et al., 2019) failed to calculate for the following reasons:   The valid total cholesterol range is 130 to 320 mg/dL    Assessment & Plan:  Assessment and Plan Assessment & Plan Shortness of breath   Intermittent shortness of breath is likely related to obesity. A stress test is scheduled for March 20, 2024, to rule out cardiac causes, given her history of stent placement with similar symptoms. A full blood panel, including a lipid profile, will be ordered to assist in the assessment.  Obesity   Obesity contributes to her shortness of breath. Weight loss is recommended for heart health and symptom relief. The potential use of weight loss medications like Frederik Jansky was discussed, but coverage by [employer] for Life is uncertain. Emphasis was placed on the importance of weight loss through diet and exercise, as symptoms may relate more to weight gain than cardiac issues. Coverage for weight loss medication will be discussed with [employer] for Life.  Chronic back pain   Severe chronic back pain remains refractory to surgical intervention. An orthopedic surgeon advised against surgery due to potential  worsening and limited benefit, as it may aid leg strength but not the back. Current management includes hydrocodone  for pain control, with dosing adjusted based on activity level. She prefers this management over surgery. Pain management will be coordinated with a [facility] pain specialist every three months.   Problem List Items Addressed This Visit       Unprioritized   Generalized anxiety disorder   Relevant Medications   clonazePAM  (KLONOPIN ) 1 MG tablet   Hyperlipidemia - Primary   Encourage heart healthy diet such as MIND or DASH diet, increase exercise, avoid trans fats, simple carbohydrates and processed foods, consider a krill or fish or flaxseed oil cap daily.        Relevant Orders   CBC with Differential/Platelet   Comprehensive metabolic panel with GFR   Lipid panel   Lipoprotein A (LPA)   TSH   Essential hypertension   Well controlled, no changes to meds. Encouraged heart healthy diet such as the DASH diet and exercise as tolerated.  Relevant Orders   CBC with Differential/Platelet   Comprehensive metabolic panel with GFR   Lipid panel   TSH   Other Visit Diagnoses       Need for pneumococcal 20-valent conjugate vaccination       Relevant Orders   Pneumococcal conjugate vaccine 20-valent (Prevnar 20) (Completed)     Hyperglycemia       Relevant Orders   Hemoglobin A1c       No follow-ups on file.    Obadiah Dennard R Lowne Chase, DO

## 2024-03-15 NOTE — Assessment & Plan Note (Signed)
 Encourage heart healthy diet such as MIND or DASH diet, increase exercise, avoid trans fats, simple carbohydrates and processed foods, consider a krill or fish or flaxseed oil cap daily.

## 2024-03-19 NOTE — Telephone Encounter (Signed)
 Pt made aware

## 2024-03-20 LAB — CBC WITH DIFFERENTIAL/PLATELET
Absolute Lymphocytes: 1793 {cells}/uL (ref 850–3900)
Absolute Monocytes: 593 {cells}/uL (ref 200–950)
Basophils Absolute: 53 {cells}/uL (ref 0–200)
Basophils Relative: 0.7 %
Eosinophils Absolute: 180 {cells}/uL (ref 15–500)
Eosinophils Relative: 2.4 %
HCT: 45.2 % — ABNORMAL HIGH (ref 35.0–45.0)
Hemoglobin: 14.9 g/dL (ref 11.7–15.5)
MCH: 29.3 pg (ref 27.0–33.0)
MCHC: 33 g/dL (ref 32.0–36.0)
MCV: 88.8 fL (ref 80.0–100.0)
MPV: 10.6 fL (ref 7.5–12.5)
Monocytes Relative: 7.9 %
Neutro Abs: 4883 {cells}/uL (ref 1500–7800)
Neutrophils Relative %: 65.1 %
Platelets: 216 10*3/uL (ref 140–400)
RBC: 5.09 10*6/uL (ref 3.80–5.10)
RDW: 13.5 % (ref 11.0–15.0)
Total Lymphocyte: 23.9 %
WBC: 7.5 10*3/uL (ref 3.8–10.8)

## 2024-03-20 LAB — COMPREHENSIVE METABOLIC PANEL WITH GFR
AG Ratio: 2 (calc) (ref 1.0–2.5)
ALT: 17 U/L (ref 6–29)
AST: 16 U/L (ref 10–35)
Albumin: 4.1 g/dL (ref 3.6–5.1)
Alkaline phosphatase (APISO): 67 U/L (ref 37–153)
BUN/Creatinine Ratio: 25 (calc) — ABNORMAL HIGH (ref 6–22)
BUN: 13 mg/dL (ref 7–25)
CO2: 26 mmol/L (ref 20–32)
Calcium: 8.9 mg/dL (ref 8.6–10.4)
Chloride: 105 mmol/L (ref 98–110)
Creat: 0.51 mg/dL — ABNORMAL LOW (ref 0.60–1.00)
Globulin: 2.1 g/dL (ref 1.9–3.7)
Glucose, Bld: 91 mg/dL (ref 65–99)
Potassium: 4.8 mmol/L (ref 3.5–5.3)
Sodium: 139 mmol/L (ref 135–146)
Total Bilirubin: 0.6 mg/dL (ref 0.2–1.2)
Total Protein: 6.2 g/dL (ref 6.1–8.1)
eGFR: 99 mL/min/{1.73_m2} (ref >=60)

## 2024-03-20 LAB — LIPID PANEL
Cholesterol: 143 mg/dL (ref <200)
HDL: 47 mg/dL — ABNORMAL LOW (ref >=50)
LDL Cholesterol (Calc): 73 mg/dL
Non-HDL Cholesterol (Calc): 96 mg/dL (ref <130)
Total CHOL/HDL Ratio: 3 (calc) (ref <5.0)
Triglycerides: 151 mg/dL — ABNORMAL HIGH (ref <150)

## 2024-03-20 LAB — HEMOGLOBIN A1C
Hgb A1c MFr Bld: 6 % — ABNORMAL HIGH (ref <5.7)
Mean Plasma Glucose: 126 mg/dL
eAG (mmol/L): 7 mmol/L

## 2024-03-20 LAB — TSH: TSH: 2.07 m[IU]/L (ref 0.40–4.50)

## 2024-03-20 LAB — LIPOPROTEIN A (LPA): Lipoprotein (a): <10 nmol/L (ref <75)

## 2024-03-20 NOTE — Progress Notes (Unsigned)
 Subjective:    Patient ID: Erin Acosta, female    DOB: [birth year], 73 y.o.   MRN: [id number]  HPI: Erin Acosta is a 73 y.o. female who returns for follow up appointment for chronic pain and medication refill. states *** pain is located in  ***. rates pain ***. current exercise regime is walking and performing stretching exercises.  [name] Morphine equivalent is *** MME.        Pain Inventory Average Pain 6 Pain Right Now 7 My pain is intermittent, constant, and aching  In the last 24 hours, has pain interfered with the following? General activity 7 Relation with others 8 Enjoyment of life 9 What TIME of day is your pain at its worst? morning  Sleep (in general) Fair  Pain is worse with: inactivity and standing Pain improves with: medication Relief from Meds: 7  Family History  Problem Relation Age of Onset   Arthritis Other    Hyperlipidemia Other    Heart disease Other    Stroke Other    Hypertension Other    Social History   Socioeconomic History   Marital status: Widowed    Spouse name: Not on file   Number of children: Not on file   Years of education: Not on file   Highest education level: Not on file  Occupational History   Not on file  Tobacco Use   Smoking status: Never   Smokeless tobacco: Never  Vaping Use   Vaping status: Never Used  Substance and Sexual Activity   Alcohol use: Yes    Alcohol/week: 1.0 standard drink of alcohol    Types: 1 Standard drinks or equivalent per week    Comment: Wine in the evening   Drug use: No   Sexual activity: Not on file  Other Topics Concern   Not on file  Social History Narrative   Not on file   Social Drivers of Health   Financial Resource Strain: Low Risk  (07/20/2023)   Overall Financial Resource Strain (CARDIA)    Difficulty of Paying Living Expenses: Not hard at all  Food Insecurity: No Food Insecurity (07/20/2023)   Hunger Vital Sign    Worried About Running Out of Food in the Last  Year: Never true    Ran Out of Food in the Last Year: Never true  Transportation Needs: No Transportation Needs (07/20/2023)   PRAPARE - Administrator, Civil Service (Medical): No    Lack of Transportation (Non-Medical): No  Physical Activity: Inactive (07/20/2023)   Exercise Vital Sign    Days of Exercise per Week: 0 days    Minutes of Exercise per Session: 0 min  Stress: No Stress Concern Present (07/20/2023)   [employer] of Occupational Health - Occupational Stress Questionnaire    Feeling of Stress : Only a little  Social Connections: Moderately Isolated (07/20/2023)   Social Connection and Isolation Panel [NHANES]    Frequency of Communication with Friends and Family: Twice a week    Frequency of Social Gatherings with Friends and Family: Twice a week    Attends Religious Services: Never    Database administrator or Organizations: Yes    Attends Banker Meetings: Never    Marital Status: Widowed   Past Surgical History:  Procedure Laterality Date   BILATERAL CARPAL TUNNEL RELEASE     BLEPHAROPLASTY     BREAST BIOPSY     unsure which breast   CESAREAN SECTION  FOOT SURGERY Right    mini face lift     TOTAL SHOULDER ARTHROPLASTY Right    [facility]   TRIGGER FINGER RELEASE     tummy tuck     Past Surgical History:  Procedure Laterality Date   BILATERAL CARPAL TUNNEL RELEASE     BLEPHAROPLASTY     BREAST BIOPSY     unsure which breast   CESAREAN SECTION     FOOT SURGERY Right    mini face lift     TOTAL SHOULDER ARTHROPLASTY Right    [facility]   TRIGGER FINGER RELEASE     tummy tuck     Past Medical History:  Diagnosis Date   Allergy    Anxiety    Depression    Genital herpes    H/O cesarean section    Hypertension    Rotator cuff tear    Scoliosis    There were no vitals taken for this visit.  Opioid Risk Score:   Fall Risk Score:  `1  Depression screen PHQ 2/9     01/23/2024    1:05 PM 08/21/2023    1:07 PM  07/20/2023   11:25 AM 05/17/2023   11:25 AM 02/13/2023    2:52 PM 10/05/2022   11:28 AM 09/05/2022   10:45 AM  Depression screen PHQ 2/9  Decreased Interest 1 0 0 1 0 0 0  Down, Depressed, Hopeless 1 0 0 1 0 0 0  PHQ - 2 Score 2 0 0 2 0 0 0  Altered sleeping       1  Tired, decreased energy       1  Change in appetite       0  Feeling bad or failure about yourself        0  Trouble concentrating       0  Moving slowly or fidgety/restless       0  Suicidal thoughts       0  PHQ-9 Score       2    Review of Systems  Musculoskeletal:  Positive for back pain.  All other systems reviewed and are negative.     Objective:   Physical Exam        Assessment & Plan:   Chronic Bilateral Low Back Pain without sciatica/ Lumbar Radiculitis: Continue HEP as Tolerated. Continue to Monitor. 01/23/2024 Chronic Right Hip/ OA: No complaints today. Continue HEP as Tolerated. Continue to Monitor. 01/23/2024 Chronic Pain Syndrome: Refilled: Increased : Hydrocodone  10/325 mg one tablet four times a day as needed for [pain # 110. Second script sent for the following month.  We will continue the opioid monitoring program, this consists of regular clinic visits, examinations, urine drug screen, pill counts as well as use of [place]  Controlled Substance Reporting system. A 12 month History has been reviewed on the [place]  Controlled Substance Reporting System on 01/23/2024,    F/U in 2 months

## 2024-03-21 ENCOUNTER — Encounter: Attending: Physical Medicine and Rehabilitation | Admitting: Registered Nurse

## 2024-03-21 ENCOUNTER — Encounter: Payer: Self-pay | Admitting: Registered Nurse

## 2024-03-21 VITALS — BP 122/80 | HR 75 | Ht 60.0 in | Wt 152.0 lb

## 2024-03-21 DIAGNOSIS — G894 Chronic pain syndrome: Secondary | ICD-10-CM | POA: Insufficient documentation

## 2024-03-21 DIAGNOSIS — M5416 Radiculopathy, lumbar region: Secondary | ICD-10-CM | POA: Diagnosis not present

## 2024-03-21 DIAGNOSIS — Z5181 Encounter for therapeutic drug level monitoring: Secondary | ICD-10-CM | POA: Insufficient documentation

## 2024-03-21 DIAGNOSIS — Z79891 Long term (current) use of opiate analgesic: Secondary | ICD-10-CM | POA: Diagnosis not present

## 2024-03-21 MED ORDER — HYDROCODONE-ACETAMINOPHEN 10-325 MG PO TABS: 1.0000 | ORAL_TABLET | Freq: Four times a day (QID) | ORAL | 0 refills | Status: DC | PRN

## 2024-03-23 ENCOUNTER — Encounter: Payer: Self-pay | Admitting: Family Medicine

## 2024-03-27 ENCOUNTER — Telehealth: Payer: Self-pay | Admitting: Family Medicine

## 2024-03-27 NOTE — Telephone Encounter (Signed)
 None of these medications are on the pt's current medication list and all are similar medications. This RN attempted to reach pt to clarify what refills are needed. LVM

## 2024-03-27 NOTE — Telephone Encounter (Unsigned)
 Copied from CRM [id number]. Topic: Clinical - Medication Refill >> Mar 27, 2024  2:10 PM Deaijah H wrote: Medication: Wegovy 0.25 mg pen/ Saxenda 6MG /ML 5 by 3 amount Pen Injector / Contrave ER 8/90 MG tablet/ Qsymia 3.75 MG/23MG  capsules / Phentermine HCL 15MG  Capsules 90 day supply on all 5 w/ up to 3 refills  Has the patient contacted their pharmacy? Yes (Agent: If no, request that the patient contact the pharmacy for the refill. If patient does not wish to contact the pharmacy document the reason why and proceed with request.) (Agent: If yes, when and what did the pharmacy advise?)  This is the patient's preferred pharmacy:  [facility] - [place], [place] - [address] [place] [address] Phone: [phone] Fax: [phone]  [facility] [phone] - [place], [place] - [address] AT [facility] [address] [place] [address] Phone: [phone] Fax: [phone]  Is this the correct pharmacy for this prescription? Yes If no, delete pharmacy and type the correct one.   Has the prescription been filled recently? No  Is the patient out of the medication? Yes  Has the patient been seen for an appointment in the last year OR does the patient have an upcoming appointment? Yes  Can we respond through MyChart? Yes  Agent: Please be advised that Rx refills may take up to 3 business days. We ask that you follow-up with your pharmacy.

## 2024-04-08 ENCOUNTER — Other Ambulatory Visit: Payer: Self-pay | Admitting: Family Medicine

## 2024-04-11 ENCOUNTER — Ambulatory Visit: Admitting: Family Medicine

## 2024-04-29 ENCOUNTER — Ambulatory Visit (INDEPENDENT_AMBULATORY_CARE_PROVIDER_SITE_OTHER): Admitting: Family Medicine

## 2024-04-29 ENCOUNTER — Encounter: Payer: Self-pay | Admitting: Family Medicine

## 2024-04-29 VITALS — BP 137/72 | HR 65 | Temp 98.5°F | Resp 16 | Ht 60.0 in | Wt 156.0 lb

## 2024-04-29 DIAGNOSIS — I2510 Atherosclerotic heart disease of native coronary artery without angina pectoris: Secondary | ICD-10-CM | POA: Diagnosis not present

## 2024-04-29 DIAGNOSIS — R413 Other amnesia: Secondary | ICD-10-CM

## 2024-04-29 MED ORDER — OZEMPIC (0.25 OR 0.5 MG/DOSE) 2 MG/3ML ~~LOC~~ SOPN: PEN_INJECTOR | SUBCUTANEOUS | 0 refills | Status: DC

## 2024-04-29 NOTE — Assessment & Plan Note (Signed)
 Ozempic rx To help with cad and weight

## 2024-04-29 NOTE — Progress Notes (Signed)
 Established Patient Office Visit  Subjective   Patient ID: Erin Acosta, female    DOB: 30-Nov-1950  Age: 73 y.o. MRN: [id number]  Chief Complaint  Patient presents with   Weight Management Screening    Here to discuss weight management   Memory Loss    Patient complains of memory loss     HPI Discussed the use of AI scribe software for clinical note transcription with the patient, who gave verbal consent to proceed.  History of Present Illness Erin Acosta is a 73 year old female with coronary artery disease who presents with memory loss and concerns about weight management.  She experiences memory loss, particularly with word-finding difficulties during conversations, which is embarrassing for her, especially when speaking with older individuals who do not have similar issues. Her family often assists her in finding words. Her sister also has similar memory problems, alongside multiple autoimmune diseases and heart problems.  She is actively trying to lose weight and has discussed options with her cardiologist. She is concerned about insurance coverage for heart-friendly products. No chest pain is reported, but she experiences occasional dyspnea with overexertion, such as during yard work. She is scheduled for a stress test in about a week and a half.  She is currently taking hydrocodone  for back pain, prescribed for up to four times a day, but she typically takes it two to three times a day as needed. She suspects that hydrocodone  may be contributing to her memory issues but cannot discontinue it due to severe back pain. A previous MRI was performed, and she discussed the results with her back surgeon.  She mentions a recent visit from her daughter, during which she engaged in activities that deviated from her usual routine, including dietary changes that may have exacerbated her adult acne. She is semi-lactose intolerant and indulged in foods like burgers and milkshakes  during her daughter's visit.   Patient Active Problem List   Diagnosis Date Noted   Coronary artery disease involving native coronary artery of native heart without angina pectoris 04/29/2024   Memory loss 04/29/2024   Viral URI 10/09/2023   Osteoarthritis of right hip 05/26/2023   RLQ abdominal pain 05/19/2023   Constipation due to opioid therapy 05/19/2023   Encounter for long-term use of opiate analgesic 05/17/2023   Encounter for medication monitoring 05/17/2023   Chronic pain syndrome 02/19/2023   Right lumbar radiculitis 02/19/2023   Pre-op examination 12/04/2022   Bilateral sciatica 09/05/2022   Hamstring tightness of right lower extremity 09/05/2022   Chronic right hip pain 09/05/2022   Spinal stenosis of lumbar region 04/07/2022   Stress incontinence of urine 01/11/2022   Bronchitis 05/18/2021   Bruising 07/15/2020   Urinary incontinence 04/21/2020   Overweight (BMI 25.0-29.9) 12/12/2019   Allergies 11/05/2018   Depression with anxiety 08/24/2017   Hyperlipidemia 05/08/2017   Pulmonary nodules 08/25/2016   Generalized anxiety disorder 03/27/2016   Right rotator cuff tear 03/27/2016   Stenosis of subclavian artery (HCC) 03/10/2016   Disease of lipid metabolism 03/10/2016   Essential hypertension 03/10/2016   Chest pain 03/10/2016   Past Medical History:  Diagnosis Date   Allergy    Anxiety    Depression    Genital herpes    H/O cesarean section    Hypertension    Rotator cuff tear    Scoliosis    Past Surgical History:  Procedure Laterality Date   BILATERAL CARPAL TUNNEL RELEASE     BLEPHAROPLASTY  BREAST BIOPSY     unsure which breast   CESAREAN SECTION     FOOT SURGERY Right    mini face lift     TOTAL SHOULDER ARTHROPLASTY Right    [facility]   TRIGGER FINGER RELEASE     tummy tuck     Social History   Tobacco Use   Smoking status: Never   Smokeless tobacco: Never  Vaping Use   Vaping status: Never Used  Substance Use Topics    Alcohol use: Yes    Alcohol/week: 1.0 standard drink of alcohol    Types: 1 Standard drinks or equivalent per week    Comment: Wine in the evening   Drug use: No   Social History   Socioeconomic History   Marital status: Widowed    Spouse name: Not on file   Number of children: Not on file   Years of education: Not on file   Highest education level: Not on file  Occupational History   Not on file  Tobacco Use   Smoking status: Never   Smokeless tobacco: Never  Vaping Use   Vaping status: Never Used  Substance and Sexual Activity   Alcohol use: Yes    Alcohol/week: 1.0 standard drink of alcohol    Types: 1 Standard drinks or equivalent per week    Comment: Wine in the evening   Drug use: No   Sexual activity: Not on file  Other Topics Concern   Not on file  Social History Narrative   Not on file   Social Drivers of Health   Financial Resource Strain: Low Risk  (07/20/2023)   Overall Financial Resource Strain (CARDIA)    Difficulty of Paying Living Expenses: Not hard at all  Food Insecurity: No Food Insecurity (07/20/2023)   Hunger Vital Sign    Worried About Running Out of Food in the Last Year: Never true    Ran Out of Food in the Last Year: Never true  Transportation Needs: No Transportation Needs (07/20/2023)   PRAPARE - Administrator, Civil Service (Medical): No    Lack of Transportation (Non-Medical): No  Physical Activity: Inactive (07/20/2023)   Exercise Vital Sign    Days of Exercise per Week: 0 days    Minutes of Exercise per Session: 0 min  Stress: No Stress Concern Present (07/20/2023)   [employer] of Occupational Health - Occupational Stress Questionnaire    Feeling of Stress : Only a little  Social Connections: Moderately Isolated (07/20/2023)   Social Connection and Isolation Panel [NHANES]    Frequency of Communication with Friends and Family: Twice a week    Frequency of Social Gatherings with Friends and Family: Twice a week     Attends Religious Services: Never    Database administrator or Organizations: Yes    Attends Banker Meetings: Never    Marital Status: Widowed  Intimate Partner Violence: Not At Risk (07/20/2023)   Humiliation, Afraid, Rape, and Kick questionnaire    Fear of Current or Ex-Partner: No    Emotionally Abused: No    Physically Abused: No    Sexually Abused: No   Family Status  Relation Name Status   Mother  Deceased   Father  Deceased   Other  (Not Specified)   Other  (Not Specified)   Other  (Not Specified)   Other  (Not Specified)   Other  (Not Specified)  No partnership data on file   Family History  Problem Relation Age of Onset   Arthritis Other    Hyperlipidemia Other    Heart disease Other    Stroke Other    Hypertension Other    Allergies  Allergen Reactions   Oxycodone -Acetaminophen  Anaphylaxis and Itching    Headache, delusion  Delusions   Dust Mite Extract     Other Reaction(s): Other (See Comments)  sneezing   Nickel Other (See Comments)    Redness and pain   Oxycodone  Other (See Comments)    Other Reaction(s): Psychosis      Review of Systems  Constitutional:  Negative for fever.  HENT:  Negative for congestion.   Eyes:  Negative for blurred vision.  Respiratory:  Negative for cough.   Cardiovascular:  Negative for chest pain and palpitations.  Gastrointestinal:  Negative for vomiting.  Musculoskeletal:  Negative for back pain.  Skin:  Negative for rash.  Neurological:  Negative for loss of consciousness and headaches.      Objective:     BP 137/72 (BP Location: Right Arm, Patient Position: Sitting, Cuff Size: Small)   Pulse 65   Temp 98.5 F (36.9 C) (Oral)   Resp 16   Ht 5' (1.524 m)   Wt 156 lb (70.8 kg)   SpO2 96%   BMI 30.47 kg/m  BP Readings from Last 3 Encounters:  04/29/24 137/72  03/21/24 122/80  03/15/24 122/78   Wt Readings from Last 3 Encounters:  04/29/24 156 lb (70.8 kg)  03/21/24 152 lb (68.9 kg)   03/15/24 155 lb 3.2 oz (70.4 kg)   SpO2 Readings from Last 3 Encounters:  04/29/24 96%  03/21/24 98%  03/15/24 97%      Physical Exam Vitals and nursing note reviewed.  Constitutional:      General: She is not in acute distress.    Appearance: Normal appearance. She is well-developed.  HENT:     Head: Normocephalic and atraumatic.  Eyes:     General: No scleral icterus.       Right eye: No discharge.        Left eye: No discharge.  Cardiovascular:     Rate and Rhythm: Normal rate and regular rhythm.     Heart sounds: No murmur heard. Pulmonary:     Effort: Pulmonary effort is normal. No respiratory distress.     Breath sounds: Normal breath sounds.  Musculoskeletal:        General: Normal range of motion.     Cervical back: Normal range of motion and neck supple.     Right lower leg: No edema.     Left lower leg: No edema.  Skin:    General: Skin is warm and dry.  Neurological:     General: No focal deficit present.     Mental Status: She is alert and oriented to person, place, and time.     Comments: Mmse  29/30  Psychiatric:        Mood and Affect: Mood normal.        Behavior: Behavior normal.        Thought Content: Thought content normal.        Judgment: Judgment normal.      No results found for any visits on 04/29/24.  Last CBC Lab Results  Component Value Date   WBC 7.5 03/15/2024   HGB 14.9 03/15/2024   HCT 45.2 (H) 03/15/2024   MCV 88.8 03/15/2024   MCH 29.3 03/15/2024   RDW 13.5 03/15/2024   PLT 216  03/15/2024   Last metabolic panel Lab Results  Component Value Date   GLUCOSE 91 03/15/2024   NA 139 03/15/2024   K 4.8 03/15/2024   CL 105 03/15/2024   CO2 26 03/15/2024   BUN 13 03/15/2024   CREATININE 0.51 (L) 03/15/2024   EGFR 99 03/15/2024   CALCIUM  8.9 03/15/2024   PROT 6.2 03/15/2024   ALBUMIN 4.3 05/19/2023   BILITOT 0.6 03/15/2024   ALKPHOS 85 05/19/2023   AST 16 03/15/2024   ALT 17 03/15/2024   Last lipids Lab Results   Component Value Date   CHOL 143 03/15/2024   HDL 47 (L) 03/15/2024   LDLCALC 73 03/15/2024   LDLDIRECT 73.0 04/21/2020   TRIG 151 (H) 03/15/2024   CHOLHDL 3.0 03/15/2024   Last hemoglobin A1c Lab Results  Component Value Date   HGBA1C 6.0 (H) 03/15/2024   Last thyroid  functions Lab Results  Component Value Date   TSH 2.07 03/15/2024   Last vitamin D No results found for: "25OHVITD2", "25OHVITD3", "VD25OH" Last vitamin B12 and Folate No results found for: "VITAMINB12", "FOLATE"    The 10-year ASCVD risk score (Arnett DK, et al., 2019) is: 32.9%    Assessment & Plan:   Problem List Items Addressed This Visit       Unprioritized   Memory loss - Primary   Refer to neuropsychology       Relevant Orders   Ambulatory referral to Neuropsychology   MR Brain Wo Contrast   RPR   Vitamin B12   Coronary artery disease involving native coronary artery of native heart without angina pectoris   Ozempic rx To help with cad and weight       Relevant Medications   Semaglutide,0.25 or 0.5MG /DOS, (OZEMPIC, 0.25 OR 0.5 MG/DOSE,) 2 MG/3ML SOPN    No follow-ups on file.    Annarose Ouellet R Lowne Chase, DO

## 2024-04-29 NOTE — Assessment & Plan Note (Signed)
Refer to neuropsychology

## 2024-04-30 LAB — RPR: RPR Ser Ql: NONREACTIVE

## 2024-05-02 ENCOUNTER — Encounter: Payer: Self-pay | Admitting: Physician Assistant

## 2024-05-02 ENCOUNTER — Telehealth: Payer: Self-pay

## 2024-05-02 LAB — VITAMIN B12: Vitamin B-12: 148 pg/mL — ABNORMAL LOW (ref 211–911)

## 2024-05-02 NOTE — Telephone Encounter (Signed)
 Pharmacy Patient Advocate Encounter   Received notification from CoverMyMeds that prior authorization for Ozempic (0.25 or 0.5 MG/DOSE) 2MG /3ML pen-injectors is required/requested.     Ozempic/Mounjaro is approved exclusively as an adjunct to diet and exercise to improve glycemic control in adults with type 2 diabetes mellitus.   A review of patient's medical chart reveals no documented diagnosis of type 2 diabetes or an A1C indicative of diabetes. Therefore, they do not currently meet the criteria for prior authorization of this medication. If clinically appropriate, alternative options such as Saxenda, Zepbound, or Frederik Jansky may be considered for this patient.

## 2024-05-03 ENCOUNTER — Ambulatory Visit: Payer: Self-pay | Admitting: Family Medicine

## 2024-05-06 ENCOUNTER — Ambulatory Visit
Admission: RE | Admit: 2024-05-06 | Discharge: 2024-05-06 | Disposition: A | Discharge status: Home / Self Care | Source: Ambulatory Visit | Attending: Family Medicine | Admitting: Family Medicine

## 2024-05-06 DIAGNOSIS — R4182 Altered mental status, unspecified: Secondary | ICD-10-CM | POA: Diagnosis not present

## 2024-05-06 DIAGNOSIS — R413 Other amnesia: Secondary | ICD-10-CM

## 2024-05-15 ENCOUNTER — Telehealth: Payer: Self-pay | Admitting: Family Medicine

## 2024-05-15 NOTE — Telephone Encounter (Signed)
 Copied from CRM [id number]. Topic: Medicare AWV >> May 15, 2024 10:25 AM Nathanel DEL wrote: Reason for CRM: LVM 05/15/2024 to schedule AWV. Please schedule Virtual or Telehealth visits ONLY.   Nathanel Paschal; Care Guide Ambulatory Clinical Support  l [facility] Direct Dial: [phone]

## 2024-05-17 ENCOUNTER — Ambulatory Visit (INDEPENDENT_AMBULATORY_CARE_PROVIDER_SITE_OTHER)

## 2024-05-17 DIAGNOSIS — E538 Deficiency of other specified B group vitamins: Secondary | ICD-10-CM | POA: Diagnosis not present

## 2024-05-17 MED ORDER — CYANOCOBALAMIN 1000 MCG/ML IJ SOLN
1000.0000 ug | Freq: Once | INTRAMUSCULAR | Status: AC
  Administered 2024-05-17: 1000 ug via INTRAMUSCULAR

## 2024-05-17 NOTE — Progress Notes (Signed)
 Pt here for monthly B12 injection per original order dated: Dr. Antonio stated may benefit from  b12 injections weekly x4 and then monthly   Last B12 injection:05/17/24    B12 1000mcg given IM, and pt tolerated injection well LD .  Next B12 injection scheduled for:  05/23/2024

## 2024-05-23 ENCOUNTER — Ambulatory Visit

## 2024-05-23 DIAGNOSIS — E538 Deficiency of other specified B group vitamins: Secondary | ICD-10-CM

## 2024-05-23 MED ORDER — CYANOCOBALAMIN 1000 MCG/ML IJ SOLN
1000.0000 ug | Freq: Once | INTRAMUSCULAR | Status: AC
  Administered 2024-05-23: 1000 ug via INTRAMUSCULAR

## 2024-05-23 NOTE — Progress Notes (Signed)
 Pt here for weekly B12 injection per Dr.Lowne-Chase original order dated: 04/29/24  Last B12 injection:05/17/24  Last B12 level:  05/19/24  B12 1000mcg given IM right deltoid, and pt tolerated injection well.  Next B12 injection scheduled for:  next week

## 2024-05-29 ENCOUNTER — Encounter: Attending: Physical Medicine and Rehabilitation | Admitting: Registered Nurse

## 2024-05-29 ENCOUNTER — Encounter: Payer: Self-pay | Admitting: Registered Nurse

## 2024-05-29 VITALS — BP 128/76 | HR 67 | Ht 60.0 in | Wt 151.6 lb

## 2024-05-29 DIAGNOSIS — M25551 Pain in right hip: Secondary | ICD-10-CM | POA: Insufficient documentation

## 2024-05-29 DIAGNOSIS — M545 Low back pain, unspecified: Secondary | ICD-10-CM | POA: Diagnosis not present

## 2024-05-29 DIAGNOSIS — Z5181 Encounter for therapeutic drug level monitoring: Secondary | ICD-10-CM | POA: Insufficient documentation

## 2024-05-29 DIAGNOSIS — G8929 Other chronic pain: Secondary | ICD-10-CM | POA: Diagnosis not present

## 2024-05-29 DIAGNOSIS — G894 Chronic pain syndrome: Secondary | ICD-10-CM | POA: Insufficient documentation

## 2024-05-29 DIAGNOSIS — Z79891 Long term (current) use of opiate analgesic: Secondary | ICD-10-CM | POA: Diagnosis present

## 2024-05-29 MED ORDER — HYDROCODONE-ACETAMINOPHEN 10-325 MG PO TABS: 1.0000 | ORAL_TABLET | Freq: Four times a day (QID) | ORAL | 0 refills | Status: DC | PRN

## 2024-05-29 NOTE — Progress Notes (Signed)
 Subjective:    Patient ID: Erin Acosta, female    DOB: 09/24/1951, 73 y.o.   MRN: [id number]  HPI: Erin Acosta is a 73 y.o. female who returns for follow up appointment for chronic pain and medication refill. She states her pain is located in her lower back and right hip. She rates her pain 6. Her current exercise regime is walking and performing stretching exercises.  Erin Acosta Morphine equivalent is 40.74 MME.   Oral Swab was Performed today.     Pain Inventory Average Pain 6 Pain Right Now 6 My pain is dull  In the last 24 hours, has pain interfered with the following? General activity 7 Relation with others 7 Enjoyment of life 9 What TIME of day is your pain at its worst? evening Sleep (in general) NA  Pain is worse with: inactivity Pain improves with: medication Relief from Meds: na  Family History  Problem Relation Age of Onset   Arthritis Other    Hyperlipidemia Other    Heart disease Other    Stroke Other    Hypertension Other    Social History   Socioeconomic History   Marital status: Widowed    Spouse name: Not on file   Number of children: Not on file   Years of education: Not on file   Highest education level: Not on file  Occupational History   Not on file  Tobacco Use   Smoking status: Never   Smokeless tobacco: Never  Vaping Use   Vaping status: Never Used  Substance and Sexual Activity   Alcohol use: Yes    Alcohol/week: 1.0 standard drink of alcohol    Types: 1 Standard drinks or equivalent per week    Comment: Wine in the evening   Drug use: No   Sexual activity: Not on file  Other Topics Concern   Not on file  Social History Narrative   Not on file   Social Drivers of Health   Financial Resource Strain: Low Risk  (07/20/2023)   Overall Financial Resource Strain (CARDIA)    Difficulty of Paying Living Expenses: Not hard at all  Food Insecurity: No Food Insecurity (07/20/2023)   Hunger Vital Sign    Worried About Running Out  of Food in the Last Year: Never true    Ran Out of Food in the Last Year: Never true  Transportation Needs: No Transportation Needs (07/20/2023)   PRAPARE - Administrator, Civil Service (Medical): No    Lack of Transportation (Non-Medical): No  Physical Activity: Inactive (07/20/2023)   Exercise Vital Sign    Days of Exercise per Week: 0 days    Minutes of Exercise per Session: 0 min  Stress: No Stress Concern Present (07/20/2023)   [employer] of Occupational Health - Occupational Stress Questionnaire    Feeling of Stress : Only a little  Social Connections: Moderately Isolated (07/20/2023)   Social Connection and Isolation Panel    Frequency of Communication with Friends and Family: Twice a week    Frequency of Social Gatherings with Friends and Family: Twice a week    Attends Religious Services: Never    Database administrator or Organizations: Yes    Attends Banker Meetings: Never    Marital Status: Widowed   Past Surgical History:  Procedure Laterality Date   BILATERAL CARPAL TUNNEL RELEASE     BLEPHAROPLASTY     BREAST BIOPSY     unsure which breast  CESAREAN SECTION     FOOT SURGERY Right    mini face lift     TOTAL SHOULDER ARTHROPLASTY Right    [facility]   TRIGGER FINGER RELEASE     tummy tuck     Past Surgical History:  Procedure Laterality Date   BILATERAL CARPAL TUNNEL RELEASE     BLEPHAROPLASTY     BREAST BIOPSY     unsure which breast   CESAREAN SECTION     FOOT SURGERY Right    mini face lift     TOTAL SHOULDER ARTHROPLASTY Right    [facility]   TRIGGER FINGER RELEASE     tummy tuck     Past Medical History:  Diagnosis Date   Allergy    Anxiety    Depression    Genital herpes    H/O cesarean section    Hypertension    Rotator cuff tear    Scoliosis    BP (!) 152/71   Pulse 67   Ht 5' (1.524 m)   Wt 151 lb 9.6 oz (68.8 kg)   SpO2 95%   BMI 29.61 kg/m   Opioid Risk Score:   Fall Risk Score:   `1  Depression screen [facility] 2/9     04/29/2024   11:38 AM 03/21/2024   11:30 AM 01/23/2024    1:05 PM 08/21/2023    1:07 PM 07/20/2023   11:25 AM 05/17/2023   11:25 AM 02/13/2023    2:52 PM  Depression screen PHQ 2/9  Decreased Interest 0 0 1 0 0 1 0  Down, Depressed, Hopeless 0 0 1 0 0 1 0  PHQ - 2 Score 0 0 2 0 0 2 0  Altered sleeping 1        Tired, decreased energy 1        Change in appetite 0        Feeling bad or failure about yourself  0        Trouble concentrating 0        Moving slowly or fidgety/restless 0        Suicidal thoughts 0        PHQ-9 Score 2        Difficult doing work/chores Not difficult at all           Review of Systems  Musculoskeletal:  Positive for back pain.  All other systems reviewed and are negative.      Objective:   Physical Exam Vitals and nursing note reviewed.  Constitutional:      Appearance: Normal appearance.  Cardiovascular:     Rate and Rhythm: Normal rate and regular rhythm.     Pulses: Normal pulses.     Heart sounds: Normal heart sounds.  Pulmonary:     Effort: Pulmonary effort is normal.     Breath sounds: Normal breath sounds.  Musculoskeletal:     Comments: Normal Muscle Bulk and Muscle Testing Reveals:  Upper Extremities:Full  ROM and Muscle Strength 5/5  Lumbar Paraspinal Tenderness: L-4-L-5 Lower Extremities: Full ROM and Muscle Strength 5/5 Arises from [place] with Ease Narrow Based Gait     Skin:    General: Skin is warm and dry.  Neurological:     Mental Status: She is alert and oriented to person, place, and time.  Psychiatric:        Mood and Affect: Mood normal.        Behavior: Behavior normal.  Assessment & Plan:   Chronic Bilateral Low Back Pain without sciatica/ Lumbar Radiculitis: Continue HEP as Tolerated. Continue to Monitor. 05/29/2024 Chronic Right Hip/ [place]:Rnwupwlz HEP as Tolerated. Continue to Monitor. 05/29/2024 Chronic Pain Syndrome: Refilled:: Hydrocodone  10/325 mg one tablet four  times a day as needed for [pain # 110. Second script sent for the following month.  We will continue the opioid monitoring program, this consists of regular clinic visits, examinations, urine drug screen, pill counts as well as use of [place]  Controlled Substance Reporting system. A 12 month History has been reviewed on the [place]  Controlled Substance Reporting System on 05/29/2024,    F/U in 2 months

## 2024-05-30 DIAGNOSIS — H04123 Dry eye syndrome of bilateral lacrimal glands: Secondary | ICD-10-CM | POA: Diagnosis not present

## 2024-05-30 DIAGNOSIS — H1045 Other chronic allergic conjunctivitis: Secondary | ICD-10-CM | POA: Diagnosis not present

## 2024-06-01 LAB — DRUG TOX MONITOR 1 W/CONF, ORAL FLD
Amphetamines: NEGATIVE ng/mL (ref <10)
Barbiturates: NEGATIVE ng/mL (ref <10)
Benzodiazepines: NEGATIVE ng/mL (ref <0.50)
Buprenorphine: NEGATIVE ng/mL (ref <0.10)
Cocaine: NEGATIVE ng/mL (ref <5.0)
Codeine: NEGATIVE ng/mL (ref <2.5)
Dihydrocodeine: 6.2 ng/mL — ABNORMAL HIGH (ref <2.5)
Fentanyl: NEGATIVE ng/mL (ref <0.10)
Heroin Metabolite: NEGATIVE ng/mL (ref <1.0)
Hydrocodone: 103.1 ng/mL — ABNORMAL HIGH (ref <2.5)
Hydromorphone: NEGATIVE ng/mL (ref <2.5)
MARIJUANA: NEGATIVE ng/mL (ref <2.5)
MDMA: NEGATIVE ng/mL (ref <10)
Meprobamate: NEGATIVE ng/mL (ref <2.5)
Methadone: NEGATIVE ng/mL (ref <5.0)
Morphine: NEGATIVE ng/mL (ref <2.5)
Nicotine Metabolite: NEGATIVE ng/mL (ref <5.0)
Norhydrocodone: NEGATIVE ng/mL (ref <2.5)
Noroxycodone: NEGATIVE ng/mL (ref <2.5)
Opiates: POSITIVE ng/mL — AB (ref <2.5)
Oxycodone: NEGATIVE ng/mL (ref <2.5)
Oxymorphone: NEGATIVE ng/mL (ref <2.5)
Phencyclidine: NEGATIVE ng/mL (ref <10)
Tapentadol: NEGATIVE ng/mL (ref <5.0)
Tramadol: NEGATIVE ng/mL (ref <5.0)
Zolpidem: NEGATIVE ng/mL (ref <5.0)

## 2024-06-01 LAB — DRUG TOX ALC METAB W/CON, ORAL FLD: Alcohol Metabolite: NEGATIVE ng/mL (ref <25)

## 2024-06-03 NOTE — Progress Notes (Unsigned)
 Patient is here for a vitamin B12 injection per orders from Jamee Shanks, DO.:  B12 is low----- she may benefit from b12 injections weekly x4 and then monthly Last injection: 05/23/2024. Denies gastrointestinal problems or dizziness.  B12 injection to right deltoid with no apparent complications.  Scheduled next injection in one week: 07/24.

## 2024-06-04 ENCOUNTER — Ambulatory Visit (INDEPENDENT_AMBULATORY_CARE_PROVIDER_SITE_OTHER)

## 2024-06-04 DIAGNOSIS — E538 Deficiency of other specified B group vitamins: Secondary | ICD-10-CM

## 2024-06-04 MED ORDER — CYANOCOBALAMIN 1000 MCG/ML IJ SOLN
1000.0000 ug | Freq: Once | INTRAMUSCULAR | Status: AC
  Administered 2024-06-04: 1000 ug via INTRAMUSCULAR

## 2024-06-13 ENCOUNTER — Ambulatory Visit (INDEPENDENT_AMBULATORY_CARE_PROVIDER_SITE_OTHER)

## 2024-06-13 DIAGNOSIS — E538 Deficiency of other specified B group vitamins: Secondary | ICD-10-CM | POA: Diagnosis not present

## 2024-06-13 MED ORDER — CYANOCOBALAMIN 1000 MCG/ML IJ SOLN
1000.0000 ug | Freq: Once | INTRAMUSCULAR | Status: AC
  Administered 2024-06-13: 1000 ug via INTRAMUSCULAR

## 2024-06-13 NOTE — Progress Notes (Signed)
 Patient is here for a vitamin B12 injection per orders from Jamee Shanks, DO.:  B12 is low----- she may benefit from b12 injections weekly x4 and then monthly Last injection: 06/04/24 Denies gastrointestinal problems or dizziness.   B12 injection to right deltoid with no apparent complications.  Scheduled next injection in one week: 1 month

## 2024-06-14 DIAGNOSIS — R079 Chest pain, unspecified: Secondary | ICD-10-CM | POA: Diagnosis not present

## 2024-06-26 ENCOUNTER — Other Ambulatory Visit: Payer: Self-pay | Admitting: Registered Nurse

## 2024-07-16 ENCOUNTER — Ambulatory Visit (INDEPENDENT_AMBULATORY_CARE_PROVIDER_SITE_OTHER)

## 2024-07-16 DIAGNOSIS — E538 Deficiency of other specified B group vitamins: Secondary | ICD-10-CM | POA: Diagnosis not present

## 2024-07-16 MED ORDER — CYANOCOBALAMIN 1000 MCG/ML IJ SOLN
1000.0000 ug | Freq: Once | INTRAMUSCULAR | Status: AC
  Administered 2024-07-16: 1000 ug via INTRAMUSCULAR

## 2024-07-16 NOTE — Progress Notes (Signed)
 Pt here for monthly B12 injection per original order dated:  Dr. Antonio Meth B12 is low----- she may benefit from b12 injections weekly x4 and then monthly   Last B12 injection: 06/13/24  Last B12 level: 04/29/24   B12 1000mcg given IM left deltoid, and pt tolerated injection well.  Next B12 injection scheduled for: 08/16/24 211:30 AM

## 2024-07-24 ENCOUNTER — Telehealth: Payer: Self-pay

## 2024-07-24 NOTE — Telephone Encounter (Signed)
 Patient calling into office regarding refills on Hydrocodone .  Patient states that she has an upcoming appointment to see Fidela on 07/30/24 but, will run out of medication on Sunday prior to follow up appointment.   Called [facility] to check status of any refills left and advised that patient ;last picked up medication in July and has not refilled for August.    Called and advised patient to contact pharmacy to get the hydrocodone  refilled.  Patient advised to keep follow up appointment as scheduled on 07/30/24 to keep office visits and medication refills on track.  Patient verbalized understanding and agrees with plan as noted above.

## 2024-07-30 ENCOUNTER — Encounter: Attending: Physical Medicine and Rehabilitation | Admitting: Registered Nurse

## 2024-07-30 ENCOUNTER — Encounter: Payer: Self-pay | Admitting: Registered Nurse

## 2024-07-30 VITALS — BP 136/81 | HR 65 | Ht 60.0 in | Wt 155.2 lb

## 2024-07-30 DIAGNOSIS — Z79891 Long term (current) use of opiate analgesic: Secondary | ICD-10-CM | POA: Diagnosis not present

## 2024-07-30 DIAGNOSIS — Z5181 Encounter for therapeutic drug level monitoring: Secondary | ICD-10-CM | POA: Diagnosis not present

## 2024-07-30 DIAGNOSIS — M545 Low back pain, unspecified: Secondary | ICD-10-CM | POA: Diagnosis not present

## 2024-07-30 DIAGNOSIS — G8929 Other chronic pain: Secondary | ICD-10-CM | POA: Diagnosis not present

## 2024-07-30 DIAGNOSIS — M5416 Radiculopathy, lumbar region: Secondary | ICD-10-CM | POA: Insufficient documentation

## 2024-07-30 DIAGNOSIS — M25551 Pain in right hip: Secondary | ICD-10-CM | POA: Diagnosis not present

## 2024-07-30 MED ORDER — HYDROCODONE-ACETAMINOPHEN 10-325 MG PO TABS: 1.0000 | ORAL_TABLET | Freq: Four times a day (QID) | ORAL | 0 refills | Status: DC | PRN

## 2024-07-30 MED ORDER — GABAPENTIN 600 MG PO TABS: 600.0000 mg | ORAL_TABLET | Freq: Two times a day (BID) | ORAL | 2 refills | Status: AC

## 2024-07-30 NOTE — Progress Notes (Signed)
 Subjective:    Patient ID: Erin Acosta, female    DOB: [birth year], 73 y.o.   MRN: [id number]  HPI: Erin Acosta is a 73 y.o. female who returns for follow up appointment for chronic pain and medication refill. She states her pain is located in her lower back. She rates her pain 8.Her current exercise regime is walking and performing stretching exercises.  Ms. Akerson Morphine equivalent is 40.00 MME.   Last Oral Swab was Performed on 05/29/2024, it was consistent.     Pain Inventory Average Pain 6 Pain Right Now 8 My pain is dull  In the last 24 hours, has pain interfered with the following? General activity 5 Relation with others 4 Enjoyment of life 4 What TIME of day is your pain at its worst? evening Sleep (in general) Good  Pain is worse with: inactivity and standing Pain improves with: medication Relief from Meds: 8  Family History  Problem Relation Age of Onset   Arthritis Other    Hyperlipidemia Other    Heart disease Other    Stroke Other    Hypertension Other    Social History   Socioeconomic History   Marital status: Widowed    Spouse name: Not on file   Number of children: Not on file   Years of education: Not on file   Highest education level: Not on file  Occupational History   Not on file  Tobacco Use   Smoking status: Never   Smokeless tobacco: Never  Vaping Use   Vaping status: Never Used  Substance and Sexual Activity   Alcohol use: Yes    Alcohol/week: 1.0 standard drink of alcohol    Types: 1 Standard drinks or equivalent per week    Comment: Wine in the evening   Drug use: No   Sexual activity: Not on file  Other Topics Concern   Not on file  Social History Narrative   Not on file   Social Drivers of Health   Financial Resource Strain: Low Risk  (07/20/2023)   Overall Financial Resource Strain (CARDIA)    Difficulty of Paying Living Expenses: Not hard at all  Food Insecurity: No Food Insecurity (07/20/2023)   Hunger Vital Sign     Worried About Running Out of Food in the Last Year: Never true    Ran Out of Food in the Last Year: Never true  Transportation Needs: No Transportation Needs (07/20/2023)   PRAPARE - Administrator, Civil Service (Medical): No    Lack of Transportation (Non-Medical): No  Physical Activity: Inactive (07/20/2023)   Exercise Vital Sign    Days of Exercise per Week: 0 days    Minutes of Exercise per Session: 0 min  Stress: No Stress Concern Present (07/20/2023)   [employer] of Occupational Health - Occupational Stress Questionnaire    Feeling of Stress : Only a little  Social Connections: Moderately Isolated (07/20/2023)   Social Connection and Isolation Panel    Frequency of Communication with Friends and Family: Twice a week    Frequency of Social Gatherings with Friends and Family: Twice a week    Attends Religious Services: Never    Database administrator or Organizations: Yes    Attends Banker Meetings: Never    Marital Status: Widowed   Past Surgical History:  Procedure Laterality Date   BILATERAL CARPAL TUNNEL RELEASE     BLEPHAROPLASTY     BREAST BIOPSY  unsure which breast   CESAREAN SECTION     FOOT SURGERY Right    mini face lift     TOTAL SHOULDER ARTHROPLASTY Right    [facility]   TRIGGER FINGER RELEASE     tummy tuck     Past Surgical History:  Procedure Laterality Date   BILATERAL CARPAL TUNNEL RELEASE     BLEPHAROPLASTY     BREAST BIOPSY     unsure which breast   CESAREAN SECTION     FOOT SURGERY Right    mini face lift     TOTAL SHOULDER ARTHROPLASTY Right    [facility]   TRIGGER FINGER RELEASE     tummy tuck     Past Medical History:  Diagnosis Date   Allergy    Anxiety    Depression    Genital herpes    H/O cesarean section    Hypertension    Rotator cuff tear    Scoliosis    BP 136/81   Pulse 65   Ht 5' (1.524 m)   Wt 155 lb 3.2 oz (70.4 kg)   SpO2 95%   BMI 30.31 kg/m   Opioid Risk Score:    Fall Risk Score:  `1  Depression screen [facility] 2/9     07/30/2024   11:20 AM 05/29/2024   11:23 AM 04/29/2024   11:38 AM 03/21/2024   11:30 AM 01/23/2024    1:05 PM 08/21/2023    1:07 PM 07/20/2023   11:25 AM  Depression screen PHQ 2/9  Decreased Interest 0 1 0 0 1 0 0  Down, Depressed, Hopeless 0 1 0 0 1 0 0  PHQ - 2 Score 0 2 0 0 2 0 0  Altered sleeping   1      Tired, decreased energy   1      Change in appetite   0      Feeling bad or failure about yourself    0      Trouble concentrating   0      Moving slowly or fidgety/restless   0      Suicidal thoughts   0      PHQ-9 Score   2      Difficult doing work/chores   Not difficult at all         Review of Systems  Musculoskeletal:  Positive for back pain.  All other systems reviewed and are negative.      Objective:   Physical Exam Vitals and nursing note reviewed.  Constitutional:      Appearance: Normal appearance.  Cardiovascular:     Rate and Rhythm: Normal rate and regular rhythm.     Pulses: Normal pulses.     Heart sounds: Normal heart sounds.  Pulmonary:     Effort: Pulmonary effort is normal.     Breath sounds: Normal breath sounds.  Musculoskeletal:     Comments: Normal Muscle Bulk and Muscle Testing Reveals:  Upper Extremities: Full ROM and Muscle Strength 5/5  Lower Extremities: Full ROM and Muscle Strength 5/5 Arises from chair with ease Narrow Based  Gait     Skin:    General: Skin is warm and dry.  Neurological:     Mental Status: She is alert and oriented to person, place, and time.  Psychiatric:        Mood and Affect: Mood normal.        Behavior: Behavior normal.  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 Continue to Monitor. 07/30/2024 Chronic Right Hip/ [place]:Rnwupwlz HEP as Tolerated. Continue to Monitor. 07/30/2024 Chronic Pain Syndrome: Refilled:: Hydrocodone  10/325 mg one tablet four times a day as needed  for [pain # 110. Second script sent for the following month.  We will continue the opioid monitoring program, this consists of regular clinic visits, examinations, urine drug screen, pill counts as well as use of [place]  Controlled Substance Reporting system. A 12 month History has been reviewed on the   Controlled Substance Reporting System on 07/30/2024,    F/U in 2 months

## 2024-07-30 NOTE — Progress Notes (Incomplete)
 Assessment/Plan:   Erin Acosta is a very pleasant 73 y.o. year old RH female with a history of hypertension, hyperlipidemia, CAD, anxiety, depression, arthritis with chronic back and hip pain on chrnic opiates, seen today for evaluation of memory loss. MoCA today is /30.***.  Patient is able to participate on ADLs***.  Patient continues to drive without significant difficulties.***    Memory Impairment of unclear etiology  MRI brain without contrast to assess for underlying structural abnormality and assess vascular load  Neurocognitive testing to further evaluate cognitive concerns and determine other underlying cause of memory changes, including potential contribution from sleep, anxiety, attention, or depression  Continue to replenish B12 (148) by PCP Continue to control mood as per PCP Recommend good control of cardiovascular risk factors Folllow up in  months***  Subjective:   The patient is accompanied by ***  who supplement  the history.   How long did patient have memory difficulties? For the last ***.  Reports some difficulty remembering new information, conversations and names.  Long-term memory is good. repeats oneself?  Endorsed Disoriented when walking into a room?  Patient denies except occasionally not remembering what patient came to the room for ***  Leaving objects in unusual places? Denies.   Wandering behavior?  denies .  Any personality changes?  Denies.   Any history of depression?: She has a history of depression with anxiety. Hallucinations or paranoia?  Denies   Seizures?  Denies    Any sleep changes?   Sleeps well***does not sleep well***denies vivid dreams, REM behavior or sleepwalking   Sleep apnea?  Denies   Any hygiene concerns?  Denies   Independent of bathing and dressing?  Endorsed  Does the patient needs help with medications? Patient is in charge *** Who is in charge of the finances? Patient is in charge   *** Any changes in appetite?   Denies ***   Patient have trouble swallowing? Denies.   Does the patient cook? No ***  Any kitchen accidents such as leaving the stove on? Denies.   Any history of headaches?   Denies.   Chronic pain ? Denies.   Ambulates with difficulty?  Denies. *** Recent falls or head injuries? Denies.   Vision changes? Denies.   Any stroke like symptoms? Denies.   Any tremors?   Denies.   Any anosmia?  Denies.   Any incontinence of urine? Endorsed, chronic.   Any bowel dysfunction? Denies.      Patient lives with  *** History of heavy alcohol intake? Denies.   History of heavy tobacco use? Denies.   Family history of dementia? Denies.  Does patient drive? Yes ***  Pertinent labs 04/2024: B12 148 (low). TSH 02/2024 was 2.07 A1C 6.0  MRI brain wo contrast 04/2024, personally reviewed remarkable formild to moderate cortical atrophy greatest at the bifrontal region, mild chronic small vessel disease, age related changes. No acute findings.   Past Medical History:  Diagnosis Date   Allergy    Anxiety    Depression    Genital herpes    H/O cesarean section    Hypertension    Rotator cuff tear    Scoliosis      Past Surgical History:  Procedure Laterality Date   BILATERAL CARPAL TUNNEL RELEASE     BLEPHAROPLASTY     BREAST BIOPSY     unsure which breast   CESAREAN SECTION     FOOT SURGERY Right    mini face lift  TOTAL SHOULDER ARTHROPLASTY Right    [facility]   TRIGGER FINGER RELEASE     tummy tuck       Allergies  Allergen Reactions   Oxycodone -Acetaminophen  Anaphylaxis and Itching    Headache, delusion  Delusions   Dust Mite Extract     Other Reaction(s): Other (See Comments)  sneezing   Nickel Other (See Comments)    Redness and pain   Oxycodone  Other (See Comments)    Other Reaction(s): Psychosis    Current Outpatient Medications  Medication Instructions   aspirin EC 81 mg, Daily   atorvastatin  (LIPITOR) 40 mg, Oral, Daily   cetirizine  (ZYRTEC ) 10 mg, Oral,  Daily   clonazePAM  (KLONOPIN ) 1 mg, Oral, 2 times daily PRN   escitalopram  (LEXAPRO ) 20 mg, Oral, Daily   gabapentin  (NEURONTIN ) 600 mg, Oral, 2 times daily   gentamicin  cream (GARAMYCIN ) 0.1 % 1 Application, Topical, 3 times daily   HYDROcodone -acetaminophen  (NORCO) 10-325 MG tablet 1 tablet, Oral, 4 times daily PRN, Do Not Fill Before 08/22/2024   Myrbetriq  50 mg, Oral, Daily   nitroGLYCERIN  (NITROSTAT ) 0.4 mg, Sublingual, Every 5 min PRN   olopatadine  (PATADAY ) 0.1 % ophthalmic solution 1 drop, Both Eyes, 2 times daily   Semaglutide ,0.25 or 0.5MG /DOS, (OZEMPIC , 0.25 OR 0.5 MG/DOSE,) 2 MG/3ML SOPN 0.25 MG sq weekly   senna-docusate (SENOKOT-S) 8.6-50 MG tablet 1 tablet, Oral, Daily at bedtime   tretinoin  (RETIN-A ) 0.025 % cream Topical, Daily at bedtime     VITALS:  There were no vitals filed for this visit.        No data to display             04/29/2024   11:34 AM  MMSE - Mini Mental State Exam  Orientation to time 5  Orientation to Place 5  Registration 3  Attention/ Calculation 5  Recall 3  Language- name 2 objects 2  Language- repeat 1  Language- follow 3 step command 3  Language- read & follow direction 1  Write a sentence 1  Copy design 0  Total score 29     PHYSICAL EXAM   HEENT:  Normocephalic, atraumatic. The superficial temporal arteries are without ropiness or tenderness. Cardiovascular: Regular rate and rhythm. Lungs: Clear to auscultation bilaterally. Neck: There are no carotid bruits noted bilaterally.  Orientation:  Alert and oriented to person, place and time. No aphasia or dysarthria. Fund of knowledge is appropriate. Recent memory impaired and remote memory intact.  Attention and concentration are normal.  Able to name objects and repeat phrases. Delayed recall  /5 Cranial nerves: There is good facial symmetry. Extraocular muscles are intact and visual fields are full to confrontational testing. Speech is fluent and clear. No tongue deviation.  Hearing is intact to conversational tone. Tone: Tone is good throughout. Abnormal movements: No tremors. No Asterixis. No Fasciculations Sensation: Sensation is intact to light touch. Vibration is intact at the bilateral big toe.  Coordination: The patient has no difficulty with RAM's or FNF bilaterally. Normal finger to nose  Motor: Strength is 5/5 in the bilateral upper and lower extremities. There is no pronator drift. There are no fasciculations noted. DTR's: Deep tendon reflexes are 2/4 bilaterally. Gait and Station: The patient is able to ambulate without difficulty The patient is able to heel toe walk. Gait is cautious and narrow. The patient is able to ambulate in a tandem fashion.       Thank you for allowing us  the opportunity to participate in the care  of this nice patient. Please do not hesitate to contact us  for any questions or concerns.   Total time spent on today's visit was *** minutes dedicated to this patient today, preparing to see patient, examining the patient, ordering tests and/or medications and counseling the patient, documenting clinical information in the EHR or other health record, independently interpreting results and communicating results to the patient/family, discussing treatment and goals, answering patient's questions and coordinating care.  Cc:  Antonio Cyndee Jamee JONELLE, DO  [facility] 07/30/2024 2:02 PM

## 2024-08-02 ENCOUNTER — Encounter (HOSPITAL_BASED_OUTPATIENT_CLINIC_OR_DEPARTMENT_OTHER): Payer: Self-pay | Admitting: Family Medicine

## 2024-08-02 DIAGNOSIS — Z139 Encounter for screening, unspecified: Secondary | ICD-10-CM

## 2024-08-06 ENCOUNTER — Ambulatory Visit

## 2024-08-06 ENCOUNTER — Encounter: Payer: Self-pay | Admitting: Physician Assistant

## 2024-08-06 ENCOUNTER — Ambulatory Visit (INDEPENDENT_AMBULATORY_CARE_PROVIDER_SITE_OTHER): Admitting: Physician Assistant

## 2024-08-06 VITALS — BP 130/80 | HR 73 | Resp 20 | Ht 60.0 in

## 2024-08-06 DIAGNOSIS — R413 Other amnesia: Secondary | ICD-10-CM | POA: Diagnosis not present

## 2024-08-06 NOTE — Patient Instructions (Addendum)
 It was a pleasure to see you today at our office.   Recommendations:  Neurocognitive evaluation at our office   Check B1 by her PCP Follow up after neuropsychological evaluation   Continue B12 replenishment      [email or web address]   RECOMMENDATIONS FOR ALL PATIENTS WITH MEMORY PROBLEMS: 1. Continue to exercise (Recommend 30 minutes of walking everyday, or 3 hours every week) 2. Increase social interactions - continue going to [place] and enjoy social gatherings with friends and family 3. Eat healthy, avoid fried foods and eat more fruits and vegetables 4. Maintain adequate blood pressure, blood sugar, and blood cholesterol level. Reducing the risk of stroke and cardiovascular disease also helps promoting better memory. 5. Avoid stressful situations. Live a simple life and avoid aggravations. Organize your time and prepare for the next day in anticipation. 6. Sleep well, avoid any interruptions of sleep and avoid any distractions in the bedroom that may interfere with adequate sleep quality 7. Avoid sugar, avoid sweets as there is a strong link between excessive sugar intake, diabetes, and cognitive impairment We discussed the Mediterranean diet, which has been shown to help patients reduce the risk of progressive memory disorders and reduces cardiovascular risk. This includes eating fish, eat fruits and green leafy vegetables, nuts like almonds and hazelnuts, walnuts, and also use olive oil. Avoid fast foods and fried foods as much as possible. Avoid sweets and sugar as sugar use has been linked to worsening of memory function.  There is always a concern of gradual progression of memory problems. If this is the case, then we may need to adjust level of care according to patient needs. Support, both to the patient and caregiver, should then be put into place.      You have been referred for a neuropsychological evaluation (i.e.,  evaluation of memory and thinking abilities). Please bring someone with you to this appointment if possible, as it is helpful for the doctor to hear from both you and another adult who knows you well. Please bring eyeglasses and hearing aids if you wear them.    The evaluation will take approximately 3 hours and has two parts:   The first part is a clinical interview with the neuropsychologist (Dr. Richie or Dr. Gayland). During the interview, the neuropsychologist will speak with you and the individual you brought to the appointment.    The second part of the evaluation is testing with the doctor's technician Neal or Luke). During the testing, the technician will ask you to remember different types of material, solve problems, and answer some questionnaires. Your family member will not be present for this portion of the evaluation.   Please note: We must reserve several hours of the neuropsychologist's time and the psychometrician's time for your evaluation appointment. As such, there is a No-Show fee of $100. If you are unable to attend any of your appointments, please contact our office as soon as possible to reschedule.      DRIVING: Regarding driving, in patients with progressive memory problems, driving will be impaired. We advise to have someone else do the driving if trouble finding directions or if minor accidents are reported. Independent driving assessment is available to determine safety of driving.   If you are interested in the driving assessment, you can contact the following:  [facility] [employer] [facility] in [place] [phone]  [facility] [phone]  [facility] [phone]  [facility] [phone] or [phone]   FALL PRECAUTIONS: Be cautious when walking. Scan the  area for obstacles that may increase the risk of trips and falls. When getting up in the mornings, sit up at the edge of the bed for a few minutes before getting out of  bed. Consider elevating the bed at the head end to avoid drop of blood pressure when getting up. Walk always in a well-lit room (use night lights in the walls). Avoid area rugs or power cords from appliances in the middle of the walkways. Use a walker or a cane if necessary and consider physical therapy for balance exercise. Get your eyesight checked regularly.  FINANCIAL OVERSIGHT: Supervision, especially oversight when making financial decisions or transactions is also recommended.  HOME SAFETY: Consider the safety of the kitchen when operating appliances like stoves, microwave oven, and blender. Consider having supervision and share cooking responsibilities until no longer able to participate in those. Accidents with firearms and other hazards in the house should be identified and addressed as well.   ABILITY TO BE LEFT ALONE: If patient is unable to contact 911 operator, consider using LifeLine, or when the need is there, arrange for someone to stay with patients. Smoking is a fire hazard, consider supervision or cessation. Risk of wandering should be assessed by caregiver and if detected at any point, supervision and safe proof recommendations should be instituted.  MEDICATION SUPERVISION: Inability to self-administer medication needs to be constantly addressed. Implement a mechanism to ensure safe administration of the medications.      Mediterranean Diet A Mediterranean diet refers to food and lifestyle choices that are based on the traditions of countries located on the [employer]. This way of eating has been shown to help prevent certain conditions and improve outcomes for people who have chronic diseases, like kidney disease and heart disease. What are tips for following this plan? Lifestyle  Cook and eat meals together with your family, when possible. Drink enough fluid to keep your urine clear or pale yellow. Be physically active every day. This includes: Aerobic exercise  like running or swimming. Leisure activities like gardening, walking, or housework. Get 7-8 hours of sleep each night. If recommended by your health care provider, drink red wine in moderation. This means 1 glass a day for nonpregnant women and 2 glasses a day for men. A glass of wine equals 5 oz (150 mL). Reading food labels  Check the serving size of packaged foods. For foods such as rice and pasta, the serving size refers to the amount of cooked product, not dry. Check the total fat in packaged foods. Avoid foods that have saturated fat or trans fats. Check the ingredients list for added sugars, such as corn syrup. Shopping  At the grocery store, buy most of your food from the areas near the walls of the store. This includes: Fresh fruits and vegetables (produce). Grains, beans, nuts, and seeds. Some of these may be available in unpackaged forms or large amounts (in bulk). Fresh seafood. Poultry and eggs. Low-fat dairy products. Buy whole ingredients instead of prepackaged foods. Buy fresh fruits and vegetables in-season from local farmers markets. Buy frozen fruits and vegetables in resealable bags. If you do not have access to quality fresh seafood, buy precooked frozen shrimp or canned fish, such as tuna, salmon, or sardines. Buy small amounts of raw or cooked vegetables, salads, or olives from the deli or salad bar at your store. Stock your pantry so you always have certain foods on hand, such as olive oil, canned tuna, canned tomatoes, rice, pasta, and beans.  Cooking  Cook foods with extra-virgin olive oil instead of using butter or other vegetable oils. Have meat as a side dish, and have vegetables or grains as your main dish. This means having meat in small portions or adding small amounts of meat to foods like pasta or stew. Use beans or vegetables instead of meat in common dishes like chili or lasagna. Experiment with different cooking methods. Try roasting or broiling vegetables  instead of steaming or sauteing them. Add frozen vegetables to soups, stews, pasta, or rice. Add nuts or seeds for added healthy fat at each meal. You can add these to yogurt, salads, or vegetable dishes. Marinate fish or vegetables using olive oil, lemon juice, garlic, and fresh herbs. Meal planning  Plan to eat 1 vegetarian meal one day each week. Try to work up to 2 vegetarian meals, if possible. Eat seafood 2 or more times a week. Have healthy snacks readily available, such as: Vegetable sticks with hummus. Greek yogurt. Fruit and nut trail mix. Eat balanced meals throughout the week. This includes: Fruit: 2-3 servings a day Vegetables: 4-5 servings a day Low-fat dairy: 2 servings a day Fish, poultry, or lean meat: 1 serving a day Beans and legumes: 2 or more servings a week Nuts and seeds: 1-2 servings a day Whole grains: 6-8 servings a day Extra-virgin olive oil: 3-4 servings a day Limit red meat and sweets to only a few servings a month What are my food choices? Mediterranean diet Recommended Grains: Whole-grain pasta. Brown rice. Bulgar wheat. Polenta. Couscous. Whole-wheat bread. [place] Madeira. Vegetables: Artichokes. Beets. Broccoli. Cabbage. Carrots. Eggplant. Green beans. Chard. Kale. Spinach. Onions. Leeks. Peas. Squash. Tomatoes. Peppers. Radishes. Fruits: Apples. Apricots. Avocado. Berries. Bananas. Cherries. Dates. Figs. Grapes. Lemons. Melon. Oranges. Peaches. Plums. Pomegranate. Meats and other protein foods: Beans. Almonds. Sunflower seeds. Pine nuts. Peanuts. Cod. Salmon. Scallops. Shrimp. Tuna. Tilapia. Clams. Oysters. Eggs. Dairy: Low-fat milk. Cheese. Greek yogurt. Beverages: Water. Red wine. Herbal tea. Fats and oils: Extra virgin olive oil. Avocado oil. Grape seed oil. Sweets and desserts: [place] yogurt with honey. Baked apples. Poached pears. Trail mix. Seasoning and other foods: Basil. Cilantro. Coriander. Cumin. Mint. Parsley. Sage. Rosemary. Tarragon.  Garlic. Oregano. Thyme. Pepper. Balsalmic vinegar. Tahini. Hummus. Tomato sauce. Olives. Mushrooms. Limit these Grains: Prepackaged pasta or rice dishes. Prepackaged cereal with added sugar. Vegetables: Deep fried potatoes (french fries). Fruits: Fruit canned in syrup. Meats and other protein foods: Beef. Pork. Lamb. Poultry with skin. Hot dogs. [place]. Dairy: Ice cream. Sour cream. Whole milk. Beverages: Juice. Sugar-sweetened soft drinks. Beer. Liquor and spirits. Fats and oils: Butter. Canola oil. Vegetable oil. Beef fat (tallow). Lard. Sweets and desserts: Cookies. Cakes. Pies. Candy. Seasoning and other foods: Mayonnaise. Premade sauces and marinades. The items listed may not be a complete list. Talk with your dietitian about what dietary choices are right for you. Summary The Mediterranean diet includes both food and lifestyle choices. Eat a variety of fresh fruits and vegetables, beans, nuts, seeds, and whole grains. Limit the amount of red meat and sweets that you eat. Talk with your health care provider about whether it is safe for you to drink red wine in moderation. This means 1 glass a day for nonpregnant women and 2 glasses a day for men. A glass of wine equals 5 oz (150 mL). This information is not intended to replace advice given to you by your health care provider. Make sure you discuss any questions you have with your health care provider. Document Released:  06/30/2016 Document Revised: 08/02/2016 Document Reviewed: 06/30/2016 Elsevier Interactive Patient Education  2017 [employer].

## 2024-08-07 DIAGNOSIS — M1811 Unilateral primary osteoarthritis of first carpometacarpal joint, right hand: Secondary | ICD-10-CM | POA: Diagnosis not present

## 2024-08-07 DIAGNOSIS — M79644 Pain in right finger(s): Secondary | ICD-10-CM | POA: Diagnosis not present

## 2024-08-08 ENCOUNTER — Other Ambulatory Visit: Payer: Self-pay | Admitting: Family Medicine

## 2024-08-08 DIAGNOSIS — F419 Anxiety disorder, unspecified: Secondary | ICD-10-CM

## 2024-08-13 ENCOUNTER — Ambulatory Visit (INDEPENDENT_AMBULATORY_CARE_PROVIDER_SITE_OTHER): Admitting: Family Medicine

## 2024-08-13 ENCOUNTER — Encounter: Payer: Self-pay | Admitting: Family Medicine

## 2024-08-13 VITALS — BP 129/76 | HR 64 | Temp 97.7°F | Resp 12 | Ht 60.0 in | Wt 152.2 lb

## 2024-08-13 DIAGNOSIS — Z1231 Encounter for screening mammogram for malignant neoplasm of breast: Secondary | ICD-10-CM | POA: Diagnosis not present

## 2024-08-13 DIAGNOSIS — E663 Overweight: Secondary | ICD-10-CM | POA: Diagnosis not present

## 2024-08-13 DIAGNOSIS — Z1239 Encounter for other screening for malignant neoplasm of breast: Secondary | ICD-10-CM | POA: Diagnosis not present

## 2024-08-13 DIAGNOSIS — I2510 Atherosclerotic heart disease of native coronary artery without angina pectoris: Secondary | ICD-10-CM

## 2024-08-13 DIAGNOSIS — N649 Disorder of breast, unspecified: Secondary | ICD-10-CM

## 2024-08-13 MED ORDER — ZEPBOUND 2.5 MG/0.5ML ~~LOC~~ SOAJ: 2.5000 mg | SUBCUTANEOUS | 0 refills | Status: DC

## 2024-08-13 NOTE — Progress Notes (Signed)
 Subjective:    Patient ID: Erin Acosta, female    DOB: 1951-09-05, 73 y.o.   MRN: [id number]  Chief Complaint  Patient presents with   discuss diet injection   mammogram    HPI Patient is in today for weight loss med and schedule mammogram.  Discussed the use of AI scribe software for clinical note transcription with the patient, who gave verbal consent to proceed.  History of Present Illness Erin Acosta is a 73 year old female who presents with issues related to weight management and memory concerns.  She is experiencing difficulty obtaining insurance coverage for weight loss medications such as Ozempic . She is considering ordering Zepbound  directly from the manufacturer due to cost considerations.  She is due for her annual mammogram and has encountered an unusual requirement for a doctor's note to schedule the appointment. Her last mammogram was in late August or September of the previous year, and she recalls a past abnormal mammogram many years ago in [place] , which resulted in the removal of a benign lesion.  She has been experiencing memory issues and has seen a neurologist who conducted initial testing. A CT scan was performed, which did not reveal any significant findings. She is now being referred for more in-depth testing due to concerns about her memory. She describes difficulty recalling words during conversations, which she finds problematic.  No diagnosis of fatty liver or sleep apnea. She is unsure if she snores but notes that she may snore when exhausted and sleeping on her back.  She is planning to increase her physical activity by walking a two-mile route in preparation for an upcoming trip to [place] and [place] in January, followed by a trip to [place] in March.    Past Medical History:  Diagnosis Date   Allergy    Anxiety    Depression    Genital herpes    H/O cesarean section    Hypertension    Rotator cuff tear    Scoliosis     Past  Surgical History:  Procedure Laterality Date   BILATERAL CARPAL TUNNEL RELEASE     BLEPHAROPLASTY     BREAST BIOPSY     unsure which breast   CESAREAN SECTION     FOOT SURGERY Right    mini face lift     TOTAL SHOULDER ARTHROPLASTY Right    [facility]   TRIGGER FINGER RELEASE     tummy tuck      Family History  Problem Relation Age of Onset   Arthritis Other    Hyperlipidemia Other    Heart disease Other    Stroke Other    Hypertension Other     Social History   Socioeconomic History   Marital status: Widowed    Spouse name: Not on file   Number of children: 1   Years of education: 7   Highest education level: Not on file  Occupational History   Not on file  Tobacco Use   Smoking status: Never   Smokeless tobacco: Never  Vaping Use   Vaping status: Never Used  Substance and Sexual Activity   Alcohol use: Yes    Alcohol/week: 1.0 standard drink of alcohol    Types: 1 Standard drinks or equivalent per week    Comment: Wine in the evening   Drug use: No   Sexual activity: Not on file  Other Topics Concern   Not on file  Social History Narrative   Right handed  Drinks caffeine    Lives alone   retired   Chief Executive Officer Drivers of [employer]: Low Risk  (07/20/2023)   Overall Financial Resource Strain (CARDIA)    Difficulty of Paying Living Expenses: Not hard at all  Food Insecurity: No Food Insecurity (07/20/2023)   Hunger Vital Sign    Worried About Running Out of Food in the Last Year: Never true    Ran Out of Food in the Last Year: Never true  Transportation Needs: No Transportation Needs (07/20/2023)   PRAPARE - Administrator, Civil Service (Medical): No    Lack of Transportation (Non-Medical): No  Physical Activity: Inactive (07/20/2023)   Exercise Vital Sign    Days of Exercise per Week: 0 days    Minutes of Exercise per Session: 0 min  Stress: No Stress Concern Present (07/20/2023)   [employer] of Occupational  Health - Occupational Stress Questionnaire    Feeling of Stress : Only a little  Social Connections: Moderately Isolated (07/20/2023)   Social Connection and Isolation Panel    Frequency of Communication with Friends and Family: Twice a week    Frequency of Social Gatherings with Friends and Family: Twice a week    Attends Religious Services: Never    Database administrator or Organizations: Yes    Attends Banker Meetings: Never    Marital Status: Widowed  Intimate Partner Violence: Not At Risk (07/20/2023)   Humiliation, Afraid, Rape, and Kick questionnaire    Fear of Current or Ex-Partner: No    Emotionally Abused: No    Physically Abused: No    Sexually Abused: No    Outpatient Medications Prior to Visit  Medication Sig Dispense Refill   aspirin EC 81 MG tablet Take 81 mg by mouth daily.     atorvastatin  (LIPITOR) 40 MG tablet TAKE 1 TABLET DAILY 90 tablet 3   cetirizine  (ZYRTEC ) 10 MG tablet TAKE 1 TABLET DAILY 90 tablet 3   clonazePAM  (KLONOPIN ) 1 MG tablet Take 1 tablet (1 mg total) by mouth 2 (two) times daily as needed. 180 tablet 1   escitalopram  (LEXAPRO ) 20 MG tablet Take 1 tablet (20 mg total) by mouth daily. 90 tablet 0   gabapentin  (NEURONTIN ) 600 MG tablet Take 1 tablet (600 mg total) by mouth 2 (two) times daily. 180 tablet 2   HYDROcodone -acetaminophen  (NORCO) 10-325 MG tablet Take 1 tablet by mouth 4 (four) times daily as needed for moderate pain (pain score 4-6). Do Not Fill Before 08/22/2024 110 tablet 0   MYRBETRIQ  50 MG TB24 tablet TAKE 1 TABLET DAILY 90 tablet 3   nitroGLYCERIN  (NITROSTAT ) 0.4 MG SL tablet Place 1 tablet (0.4 mg total) under the tongue every 5 (five) minutes as needed for chest pain. 25 tablet 3   tretinoin  (RETIN-A ) 0.025 % cream Apply topically at bedtime. 45 g 0   valACYclovir  (VALTREX ) 500 MG tablet Take 500 mg by mouth.     gentamicin  cream (GARAMYCIN ) 0.1 % Apply 1 Application topically 3 (three) times daily. 15 g 0    olopatadine  (PATADAY ) 0.1 % ophthalmic solution Place 1 drop into both eyes 2 (two) times daily. 5 mL 12   Semaglutide ,0.25 or 0.5MG /DOS, (OZEMPIC , 0.25 OR 0.5 MG/DOSE,) 2 MG/3ML SOPN 0.25 MG sq weekly 3 mL 0   senna-docusate (SENOKOT-S) 8.6-50 MG tablet Take 1 tablet by mouth at bedtime. 90 tablet 3   No facility-administered medications prior to visit.    Allergies  Allergen Reactions   Oxycodone -Acetaminophen  Anaphylaxis and Itching    Headache, delusion  Delusions   Dust Mite Extract     Other Reaction(s): Other (See Comments)  sneezing   Nickel Other (See Comments)    Redness and pain   Oxycodone  Other (See Comments)    Other Reaction(s): Psychosis    Review of Systems  Constitutional:  Negative for fever and malaise/fatigue.  HENT:  Negative for congestion.   Eyes:  Negative for blurred vision.  Respiratory:  Negative for cough and shortness of breath.   Cardiovascular:  Negative for chest pain, palpitations and leg swelling.  Gastrointestinal:  Negative for vomiting.  Musculoskeletal:  Negative for back pain.  Skin:  Negative for rash.  Neurological:  Negative for loss of consciousness and headaches.       Objective:    Physical Exam Vitals and nursing note reviewed.  Constitutional:      General: She is not in acute distress.    Appearance: Normal appearance. She is well-developed.  HENT:     Head: Normocephalic and atraumatic.  Eyes:     General: No scleral icterus.       Right eye: No discharge.        Left eye: No discharge.  Cardiovascular:     Rate and Rhythm: Normal rate and regular rhythm.     Heart sounds: No murmur heard. Pulmonary:     Effort: Pulmonary effort is normal. No respiratory distress.     Breath sounds: Normal breath sounds.  Musculoskeletal:        General: Normal range of motion.     Cervical back: Normal range of motion and neck supple.     Right lower leg: No edema.     Left lower leg: No edema.  Skin:    General: Skin is  warm and dry.  Neurological:     Mental Status: She is alert and oriented to person, place, and time.  Psychiatric:        Mood and Affect: Mood normal.        Behavior: Behavior normal.        Thought Content: Thought content normal.        Judgment: Judgment normal.     BP 129/76 (BP Location: Right Arm, Patient Position: Sitting, Cuff Size: Large)   Pulse 64   Temp 97.7 F (36.5 C) (Oral)   Resp 12   Ht 5' (1.524 m)   Wt 152 lb 3.2 oz (69 kg)   SpO2 93%   BMI 29.72 kg/m  Wt Readings from Last 3 Encounters:  08/13/24 152 lb 3.2 oz (69 kg)  07/30/24 155 lb 3.2 oz (70.4 kg)  05/29/24 151 lb 9.6 oz (68.8 kg)    Diabetic Foot Exam - Simple   No data filed    Lab Results  Component Value Date   WBC 7.5 03/15/2024   HGB 14.9 03/15/2024   HCT 45.2 (H) 03/15/2024   PLT 216 03/15/2024   GLUCOSE 91 03/15/2024   CHOL 143 03/15/2024   TRIG 151 (H) 03/15/2024   HDL 47 (L) 03/15/2024   LDLDIRECT 73.0 04/21/2020   LDLCALC 73 03/15/2024   ALT 17 03/15/2024   AST 16 03/15/2024   NA 139 03/15/2024   K 4.8 03/15/2024   CL 105 03/15/2024   CREATININE 0.51 (L) 03/15/2024   BUN 13 03/15/2024   CO2 26 03/15/2024   TSH 2.07 03/15/2024   HGBA1C 6.0 (H) 03/15/2024    Lab Results  Component Value Date   TSH 2.07 03/15/2024   Lab Results  Component Value Date   WBC 7.5 03/15/2024   HGB 14.9 03/15/2024   HCT 45.2 (H) 03/15/2024   MCV 88.8 03/15/2024   PLT 216 03/15/2024   Lab Results  Component Value Date   NA 139 03/15/2024   K 4.8 03/15/2024   CO2 26 03/15/2024   GLUCOSE 91 03/15/2024   BUN 13 03/15/2024   CREATININE 0.51 (L) 03/15/2024   BILITOT 0.6 03/15/2024   ALKPHOS 85 05/19/2023   AST 16 03/15/2024   ALT 17 03/15/2024   PROT 6.2 03/15/2024   ALBUMIN 4.3 05/19/2023   CALCIUM  8.9 03/15/2024   EGFR 99 03/15/2024   GFR 94.27 05/19/2023   Lab Results  Component Value Date   CHOL 143 03/15/2024   Lab Results  Component Value Date   HDL 47 (L)  03/15/2024   Lab Results  Component Value Date   LDLCALC 73 03/15/2024   Lab Results  Component Value Date   TRIG 151 (H) 03/15/2024   Lab Results  Component Value Date   CHOLHDL 3.0 03/15/2024   Lab Results  Component Value Date   HGBA1C 6.0 (H) 03/15/2024       Assessment & Plan:  Coronary artery disease involving native coronary artery of native heart without angina pectoris -     Zepbound ; Inject 2.5 mg into the skin once a week.  Dispense: 0.5 mL; Refill: 0  Overweight (BMI 25.0-29.9) -     Zepbound ; Inject 2.5 mg into the skin once a week.  Dispense: 0.5 mL; Refill: 0  Screening mammogram for breast cancer -     MM 3D DIAGNOSTIC MAMMOGRAM BILATERAL BREAST; Future  Breast cancer screening, high risk patient -     MM 3D DIAGNOSTIC MAMMOGRAM BILATERAL BREAST; Future  Disorder of breast, unspecified -     MM 3D DIAGNOSTIC MAMMOGRAM BILATERAL BREAST; Future   Assessment and Plan Assessment & Plan Obesity   Obesity management is complicated by insurance denial of coverage for weight loss medications. She is considering Zepbound , a cost-effective alternative to [facility], similar to [facility]. Initiate Zepbound  at 2.5 mg weekly to minimize side effects like nausea, with potential dose increase if necessary. Emphasize adequate protein intake to prevent muscle wasting, discussing dietary options such as eggs, chicken, fish, almonds, Greek yogurt, and protein drinks. Advise monitoring for nausea and report if persistent. Encourage physical activity, such as walking, to aid weight loss and prepare for an upcoming trip.   Tandy Grawe R Lowne Chase, DO

## 2024-08-14 ENCOUNTER — Telehealth: Payer: Self-pay

## 2024-08-14 ENCOUNTER — Other Ambulatory Visit (HOSPITAL_COMMUNITY): Payer: Self-pay

## 2024-08-14 NOTE — Telephone Encounter (Signed)
 Pharmacy Patient Advocate Encounter   Received notification from Pt Calls Messages that prior authorization for Zepbound  2.5mg /0.73ml is required/requested.   Insurance verification completed.   The patient is insured through [employer] .   Per test claim: Per test claim, medication is not covered due to plan/benefit exclusion, PA not submitted at this time

## 2024-08-14 NOTE — Telephone Encounter (Signed)
 FYI. Plan exclusion.

## 2024-08-14 NOTE — Telephone Encounter (Signed)
 Copied from CRM #[id number]. Topic: Clinical - Prescription Issue >> Aug 14, 2024 10:43 AM Mesmerise C wrote: Reason for CRM: Patient stated she got a message from the pharmacy about her tirzepatide  (ZEPBOUND ) 2.5 MG/0.5ML Pen that is was rejected and they needed more information from the provider

## 2024-08-14 NOTE — Telephone Encounter (Signed)
 Needs PA for Zepbound  please.

## 2024-08-14 NOTE — Telephone Encounter (Signed)
 Duplicate message. Already sent as FYI to PCP.

## 2024-08-15 ENCOUNTER — Telehealth: Payer: Self-pay

## 2024-08-15 ENCOUNTER — Telehealth: Payer: Self-pay | Admitting: Family Medicine

## 2024-08-15 NOTE — Telephone Encounter (Signed)
 Copied from CRM [id number]. Topic: Referral - Question >> Aug 15, 2024  9:03 AM Berneda FALCON wrote: Reason for CRM: Tatyana from the breast center is calling asking for clarification on a diagnostic breast mammogram order sent over to them. The order states it is for a diagnostic exam but the reasoning states it is for a screening and she wants clarification on which exam the patient needs.  Please call her back at [phone] extension [phone] Junius)

## 2024-08-15 NOTE — Telephone Encounter (Signed)
 Copied from CRM [id number]. Topic: Clinical - Medication Question >> Aug 15, 2024  1:22 PM Burnard DEL wrote: Reason for CRM: Patient would like to know if Dr Antonio Meth sent in the additional information to [facility] that they are needing for her Zepbound  medication? Patient is okay with prescription being sent to [facility] for her to pay out of pocket. She was stating that they are needing more information from provider.

## 2024-08-16 ENCOUNTER — Telehealth: Payer: Self-pay | Admitting: Family Medicine

## 2024-08-16 ENCOUNTER — Other Ambulatory Visit: Payer: Self-pay | Admitting: Family Medicine

## 2024-08-16 ENCOUNTER — Telehealth: Payer: Self-pay | Admitting: Neurology

## 2024-08-16 ENCOUNTER — Ambulatory Visit (INDEPENDENT_AMBULATORY_CARE_PROVIDER_SITE_OTHER)

## 2024-08-16 DIAGNOSIS — I2510 Atherosclerotic heart disease of native coronary artery without angina pectoris: Secondary | ICD-10-CM

## 2024-08-16 DIAGNOSIS — E663 Overweight: Secondary | ICD-10-CM

## 2024-08-16 DIAGNOSIS — E538 Deficiency of other specified B group vitamins: Secondary | ICD-10-CM

## 2024-08-16 DIAGNOSIS — Z1231 Encounter for screening mammogram for malignant neoplasm of breast: Secondary | ICD-10-CM

## 2024-08-16 MED ORDER — ZEPBOUND 2.5 MG/0.5ML ~~LOC~~ SOAJ: 2.5000 mg | SUBCUTANEOUS | 0 refills | Status: DC

## 2024-08-16 MED ORDER — CYANOCOBALAMIN 1000 MCG/ML IJ SOLN
1000.0000 ug | Freq: Once | INTRAMUSCULAR | Status: AC
  Administered 2024-08-16: 1000 ug via INTRAMUSCULAR

## 2024-08-16 NOTE — Telephone Encounter (Signed)
 Rx sent.

## 2024-08-16 NOTE — Telephone Encounter (Signed)
 Copied from CRM [id number]. Topic: Clinical - Prescription Issue >> Aug 16, 2024  2:20 PM [name] wrote: Reason for CRM: FYI- Pt calling regarding Zepbound  rx. She says it keeps getting sent through insurance, but she intends to be self pay for this medication. She says it was sent in twice to her insurance, one time being today. I let pt know it could be the mail order company she is requesting it from. She will reach out.

## 2024-08-16 NOTE — Progress Notes (Signed)
 Pt here for monthly B12 injection per PCP  Last B12 injection: 07/16/2024  B12 1000mcg given IM, and pt tolerated injection well.  Next B12 injection scheduled for: 09/18/2024

## 2024-08-16 NOTE — Telephone Encounter (Signed)
 Rx resent.

## 2024-08-16 NOTE — Telephone Encounter (Unsigned)
 Copied from CRM [id number]. Topic: Clinical - Prescription Issue >> Aug 16, 2024  2:49 PM Alfonso HERO wrote: Reason for CRM: Patient called saying the Rx for tirzepatide  (ZEPBOUND ) 2.5 MG/0.5ML Pen is being denied because it needs to be sent in as a vial and not a pen. She asked if the new Rx can be sent in to [facility] correctly. (Has to be in a vial and not the pen)

## 2024-08-16 NOTE — Telephone Encounter (Signed)
 Pt came in stating that her zepbound  was called in and denied due to insurance not covering it but the pt stated she was going to pay for it instead of going through her insurance. Pt wanted it resent without insurance to pay for it. Please call pt for any questions.

## 2024-08-16 NOTE — Telephone Encounter (Signed)
 Updated Rx sent

## 2024-08-16 NOTE — Telephone Encounter (Signed)
Order was updated

## 2024-08-20 ENCOUNTER — Telehealth: Payer: Self-pay

## 2024-08-20 MED ORDER — TIRZEPATIDE-WEIGHT MANAGEMENT 2.5 MG/0.5ML ~~LOC~~ SOLN: 2.5000 mg | SUBCUTANEOUS | 1 refills | Status: DC

## 2024-08-20 NOTE — Telephone Encounter (Signed)
 Copied from CRM #[id number]. Topic: Clinical - Prescription Issue >> Aug 20, 2024  3:13 PM Alfonso HERO wrote: Reason for CRM: patient calling because Rx for Zepbound  was sent in as a pen and is has to be sent in as a Vial.

## 2024-08-20 NOTE — Telephone Encounter (Signed)
 Pt called back to inform pcp that the rx doesn't need to be re-sent because she was informed that it's now being processed.

## 2024-08-20 NOTE — Telephone Encounter (Signed)
 Rx resent as vial

## 2024-08-26 ENCOUNTER — Ambulatory Visit (HOSPITAL_BASED_OUTPATIENT_CLINIC_OR_DEPARTMENT_OTHER)
Admission: RE | Admit: 2024-08-26 | Discharge: 2024-08-26 | Disposition: A | Discharge status: Home / Self Care | Source: Ambulatory Visit | Attending: Family Medicine | Admitting: Family Medicine

## 2024-08-26 ENCOUNTER — Encounter (HOSPITAL_BASED_OUTPATIENT_CLINIC_OR_DEPARTMENT_OTHER): Payer: Self-pay

## 2024-08-26 DIAGNOSIS — Z1231 Encounter for screening mammogram for malignant neoplasm of breast: Secondary | ICD-10-CM | POA: Diagnosis not present

## 2024-08-28 DIAGNOSIS — H52223 Regular astigmatism, bilateral: Secondary | ICD-10-CM | POA: Diagnosis not present

## 2024-08-28 DIAGNOSIS — H04123 Dry eye syndrome of bilateral lacrimal glands: Secondary | ICD-10-CM | POA: Diagnosis not present

## 2024-08-28 DIAGNOSIS — H5213 Myopia, bilateral: Secondary | ICD-10-CM | POA: Diagnosis not present

## 2024-08-28 DIAGNOSIS — H524 Presbyopia: Secondary | ICD-10-CM | POA: Diagnosis not present

## 2024-08-28 DIAGNOSIS — H43812 Vitreous degeneration, left eye: Secondary | ICD-10-CM | POA: Diagnosis not present

## 2024-08-28 DIAGNOSIS — H1045 Other chronic allergic conjunctivitis: Secondary | ICD-10-CM | POA: Diagnosis not present

## 2024-08-28 DIAGNOSIS — H25813 Combined forms of age-related cataract, bilateral: Secondary | ICD-10-CM | POA: Diagnosis not present

## 2024-09-02 ENCOUNTER — Other Ambulatory Visit: Payer: Self-pay | Admitting: Family Medicine

## 2024-09-02 MED ORDER — VALACYCLOVIR HCL 500 MG PO TABS: 500.0000 mg | ORAL_TABLET | Freq: Every day | ORAL | 1 refills | Status: AC

## 2024-09-02 NOTE — Telephone Encounter (Signed)
 Copied from CRM [id number]. Topic: Clinical - Medication Refill >> Sep 02, 2024  1:22 PM Thersia C wrote: Medication: valACYclovir  (VALTREX ) 500 MG tablet  Patient is requesting a 90 day supply  Has the patient contacted their pharmacy? Yes (Agent: If no, request that the patient contact the pharmacy for the refill. If patient does not wish to contact the pharmacy document the reason why and proceed with request.) (Agent: If yes, when and what did the pharmacy advise?)  This is the patient's preferred pharmacy:  [facility] - [place], [place] - [address] [place] [address] Phone: [phone] Fax: [phone]   Is this the correct pharmacy for this prescription? Yes If no, delete pharmacy and type the correct one.   Has the prescription been filled recently? No  Is the patient out of the medication? Yes  Has the patient been seen for an appointment in the last year OR does the patient have an upcoming appointment? Yes  Can we respond through MyChart? Yes  Agent: Please be advised that Rx refills may take up to 3 business days. We ask that you follow-up with your pharmacy.

## 2024-09-16 ENCOUNTER — Telehealth: Payer: Self-pay

## 2024-09-16 ENCOUNTER — Other Ambulatory Visit: Payer: Self-pay | Admitting: Family Medicine

## 2024-09-16 DIAGNOSIS — E663 Overweight: Secondary | ICD-10-CM

## 2024-09-16 MED ORDER — TIRZEPATIDE-WEIGHT MANAGEMENT 5 MG/0.5ML ~~LOC~~ SOLN: 5.0000 mg | SUBCUTANEOUS | 3 refills | Status: DC

## 2024-09-16 NOTE — Telephone Encounter (Signed)
 Pt requesting to increase Zepbound  to 5mg . Please advise.    Uses [facility] for this medication.

## 2024-09-16 NOTE — Telephone Encounter (Signed)
 Copied from CRM [id number]. Topic: Clinical - Medication Question >> Sep 16, 2024 11:14 AM Alexandria E wrote: Reason for CRM: Patient was previously on tirzepatide  (ZEPBOUND ) 2.5 MG/0.5ML injection vial, and questioning if she can move up to 5 MG injection vial. This medication has to go through [facility].

## 2024-09-18 ENCOUNTER — Ambulatory Visit (INDEPENDENT_AMBULATORY_CARE_PROVIDER_SITE_OTHER)

## 2024-09-18 DIAGNOSIS — E538 Deficiency of other specified B group vitamins: Secondary | ICD-10-CM

## 2024-09-18 MED ORDER — CYANOCOBALAMIN 1000 MCG/ML IJ SOLN
1000.0000 ug | Freq: Once | INTRAMUSCULAR | Status: AC
  Administered 2024-09-18: 1000 ug via INTRAMUSCULAR

## 2024-09-18 NOTE — Progress Notes (Signed)
 Pt here for monthly B12 injection per PCP  Last B12 injection: 09/18/2024  B12 1000mcg given IM, and pt tolerated injection well.  Next B12 injection scheduled for: 10/16/2024

## 2024-09-18 NOTE — Progress Notes (Unsigned)
 Subjective:    Patient ID: Erin Acosta, female    DOB: [birth year], 73 y.o.   MRN: [id number]  HPI: Erin Acosta is a 73 y.o. female who returns for follow up appointment for chronic pain and medication refill. She states her pain is located in her lower back and occasionally radiates into her bilateral lower extremities . She also reports increase lower back pain with long periods of standing. She rates her pain 4. Her current exercise regime is walking and performing stretching exercises.  [name] equivalent is 36.67 MME.   Oral Swab was Performed today.     Pain Inventory Average Pain 5 Pain Right Now 4 My pain is dull  In the last 24 hours, has pain interfered with the following? General activity 0 Relation with others 0 Enjoyment of life 0 What TIME of day is your pain at its worst? morning  and evening Sleep (in general) Fair  Pain is worse with: inactivity and standing Pain improves with: rest, therapy/exercise, and medication Relief from Meds: 1  Family History  Problem Relation Age of Onset   Arthritis Other    Hyperlipidemia Other    Heart disease Other    Stroke Other    Hypertension Other    Social History   Socioeconomic History   Marital status: Widowed    Spouse name: Not on file   Number of children: 1   Years of education: 67   Highest education level: Not on file  Occupational History   Not on file  Tobacco Use   Smoking status: Never   Smokeless tobacco: Never  Vaping Use   Vaping status: Never Used  Substance and Sexual Activity   Alcohol use: Yes    Alcohol/week: 1.0 standard drink of alcohol    Types: 1 Standard drinks or equivalent per week    Comment: Wine in the evening   Drug use: No   Sexual activity: Not on file  Other Topics Concern   Not on file  Social History Narrative   Right handed   Drinks caffeine    Lives alone   retired   Social Drivers of Corporate Investment Banker Strain: Low Risk  (07/20/2023)    Overall Financial Resource Strain (CARDIA)    Difficulty of Paying Living Expenses: Not hard at all  Food Insecurity: No Food Insecurity (07/20/2023)   Hunger Vital Sign    Worried About Running Out of Food in the Last Year: Never true    Ran Out of Food in the Last Year: Never true  Transportation Needs: No Transportation Needs (07/20/2023)   PRAPARE - Administrator, Civil Service (Medical): No    Lack of Transportation (Non-Medical): No  Physical Activity: Inactive (07/20/2023)   Exercise Vital Sign    Days of Exercise per Week: 0 days    Minutes of Exercise per Session: 0 min  Stress: No Stress Concern Present (07/20/2023)   [employer] of Occupational Health - Occupational Stress Questionnaire    Feeling of Stress : Only a little  Social Connections: Moderately Isolated (07/20/2023)   Social Connection and Isolation Panel    Frequency of Communication with Friends and Family: Twice a week    Frequency of Social Gatherings with Friends and Family: Twice a week    Attends Religious Services: Never    Database Administrator or Organizations: Yes    Attends Banker Meetings: Never    Marital Status: Widowed  Past Surgical History:  Procedure Laterality Date   BILATERAL CARPAL TUNNEL RELEASE     BLEPHAROPLASTY     BREAST BIOPSY     unsure which breast   CESAREAN SECTION     FOOT SURGERY Right    mini face lift     TOTAL SHOULDER ARTHROPLASTY Right    [facility]   TRIGGER FINGER RELEASE     tummy tuck     Past Surgical History:  Procedure Laterality Date   BILATERAL CARPAL TUNNEL RELEASE     BLEPHAROPLASTY     BREAST BIOPSY     unsure which breast   CESAREAN SECTION     FOOT SURGERY Right    mini face lift     TOTAL SHOULDER ARTHROPLASTY Right    [facility]   TRIGGER FINGER RELEASE     tummy tuck     Past Medical History:  Diagnosis Date   Allergy    Anxiety    Depression    Genital herpes    H/O cesarean section     Hypertension    Rotator cuff tear    Scoliosis    There were no vitals taken for this visit.  Opioid Risk Score:   Fall Risk Score:  `1  Depression screen [facility] 2/9     08/13/2024    6:58 PM 07/30/2024   11:20 AM 05/29/2024   11:23 AM 04/29/2024   11:38 AM 03/21/2024   11:30 AM 01/23/2024    1:05 PM 08/21/2023    1:07 PM  Depression screen PHQ 2/9  Decreased Interest 0 0 1 0 0 1 0  Down, Depressed, Hopeless 0 0 1 0 0 1 0  PHQ - 2 Score 0 0 2 0 0 2 0  Altered sleeping 1   1     Tired, decreased energy 1   1     Change in appetite 0   0     Feeling bad or failure about yourself  0   0     Trouble concentrating 0   0     Moving slowly or fidgety/restless 0   0     Suicidal thoughts 0   0     PHQ-9 Score 2   2     Difficult doing work/chores    Not difficult at all       Review of Systems     Objective:   Physical Exam Vitals and nursing note reviewed.  Constitutional:      Appearance: Normal appearance.  Cardiovascular:     Rate and Rhythm: Normal rate and regular rhythm.     Pulses: Normal pulses.     Heart sounds: Normal heart sounds.  Pulmonary:     Effort: Pulmonary effort is normal.     Breath sounds: Normal breath sounds.  Musculoskeletal:     Comments: Normal Muscle Bulk and Muscle Testing Reveals:  Upper Extremities: Full ROM and Muscle Strength 5/5 Lower Extremities: Full ROM and Muscle Strength 5/5 Arises from Table slowly Narrow Based  Gait     Skin:    General: Skin is warm and dry.  Neurological:     Mental Status: She is alert and oriented to person, place, and time.  Psychiatric:        Mood and Affect: Mood normal.        Behavior: Behavior normal.          Assessment & Plan:  Chronic Bilateral Low Back Pain without sciatica/  Lumbar Radiculitis: Continue HEP as Tolerated. Continue to Monitor. 09/19/2024 Chronic Right Hip/ OA:No complaints today. Continue HEP as Tolerated. Continue to Monitor. 09/19/2024 Chronic Pain Syndrome: Refilled::  Hydrocodone  10/325 mg one tablet four times a day as needed for [pain # 110.  We will continue the opioid monitoring program, this consists of regular clinic visits, examinations, urine drug screen, pill counts as well as use of [place]  Controlled Substance Reporting system. A 12 month History has been reviewed on the [place]  Controlled Substance Reporting System on 09/19/2024,    F/U in 2 months

## 2024-09-19 ENCOUNTER — Encounter: Payer: Self-pay | Admitting: Registered Nurse

## 2024-09-19 ENCOUNTER — Encounter: Attending: Physical Medicine and Rehabilitation | Admitting: Registered Nurse

## 2024-09-19 VITALS — BP 137/83 | HR 75 | Ht 60.0 in | Wt 146.8 lb

## 2024-09-19 DIAGNOSIS — Z79891 Long term (current) use of opiate analgesic: Secondary | ICD-10-CM | POA: Diagnosis not present

## 2024-09-19 DIAGNOSIS — G894 Chronic pain syndrome: Secondary | ICD-10-CM | POA: Diagnosis not present

## 2024-09-19 DIAGNOSIS — Z5181 Encounter for therapeutic drug level monitoring: Secondary | ICD-10-CM | POA: Insufficient documentation

## 2024-09-19 DIAGNOSIS — G8929 Other chronic pain: Secondary | ICD-10-CM | POA: Insufficient documentation

## 2024-09-19 DIAGNOSIS — M545 Low back pain, unspecified: Secondary | ICD-10-CM | POA: Insufficient documentation

## 2024-09-19 MED ORDER — HYDROCODONE-ACETAMINOPHEN 10-325 MG PO TABS: 1.0000 | ORAL_TABLET | Freq: Four times a day (QID) | ORAL | 0 refills | Status: DC | PRN

## 2024-09-24 LAB — DRUG TOX MONITOR 1 W/CONF, ORAL FLD
Amphetamines: NEGATIVE ng/mL (ref <10)
Barbiturates: NEGATIVE ng/mL (ref <10)
Benzodiazepines: NEGATIVE ng/mL (ref <0.50)
Buprenorphine: NEGATIVE ng/mL (ref <0.10)
Cocaine: NEGATIVE ng/mL (ref <5.0)
Codeine: NEGATIVE ng/mL (ref <2.5)
Dihydrocodeine: NEGATIVE ng/mL (ref <2.5)
Fentanyl: NEGATIVE ng/mL (ref <0.10)
Heroin Metabolite: NEGATIVE ng/mL (ref <1.0)
Hydrocodone: 41.5 ng/mL — ABNORMAL HIGH (ref <2.5)
Hydromorphone: NEGATIVE ng/mL (ref <2.5)
MARIJUANA: NEGATIVE ng/mL (ref <2.5)
MDMA: NEGATIVE ng/mL (ref <10)
Meprobamate: NEGATIVE ng/mL (ref <2.5)
Methadone: NEGATIVE ng/mL (ref <5.0)
Morphine: NEGATIVE ng/mL (ref <2.5)
Nicotine Metabolite: NEGATIVE ng/mL (ref <5.0)
Norhydrocodone: NEGATIVE ng/mL (ref <2.5)
Noroxycodone: NEGATIVE ng/mL (ref <2.5)
Opiates: POSITIVE ng/mL — AB (ref <2.5)
Oxycodone: NEGATIVE ng/mL (ref <2.5)
Oxymorphone: NEGATIVE ng/mL (ref <2.5)
Phencyclidine: NEGATIVE ng/mL (ref <10)
Tapentadol: NEGATIVE ng/mL (ref <5.0)
Tramadol: NEGATIVE ng/mL (ref <5.0)
Zolpidem: NEGATIVE ng/mL (ref <5.0)

## 2024-09-24 LAB — DRUG TOX ALC METAB W/CON, ORAL FLD: Alcohol Metabolite: NEGATIVE ng/mL (ref <25)

## 2024-10-11 ENCOUNTER — Telehealth: Payer: Self-pay | Admitting: Family Medicine

## 2024-10-11 NOTE — Telephone Encounter (Signed)
 Please advise. Zofran  and Garamycin  is not on med list.

## 2024-10-11 NOTE — Telephone Encounter (Unsigned)
 Copied from CRM #[id number]. Topic: Clinical - Medication Refill >> Oct 11, 2024  1:38 PM Harlene ORN wrote: Medication: tirzepatide  (ZEPBOUND ) 2.5 MG/0.5ML injection vial, gentamicin  cream (GARAMYCIN ) 0.1 % (this medication was prescribed by her Dermatologist), ZOFRAN  4 MG  Has the patient contacted their pharmacy? No (Agent: If no, request that the patient contact the pharmacy for the refill. If patient does not wish to contact the pharmacy document the reason why and proceed with request.) (Agent: If yes, when and what did the pharmacy advise?)  This is the patient's preferred pharmacy:   [id number] - [place], [place] - [address] [facility] [address] [place]  [id number] Phone: [phone] Fax: [phone]   Is this the correct pharmacy for this prescription? Yes If no, delete pharmacy and type the correct one.   Has the prescription been filled recently? No  Is the patient out of the medication? No  Has the patient been seen for an appointment in the last year OR does the patient have an upcoming appointment? Yes  Can we respond through MyChart? Yes  Agent: Please be advised that Rx refills may take up to 3 business days. We ask that you follow-up with your pharmacy.

## 2024-10-14 ENCOUNTER — Other Ambulatory Visit: Payer: Self-pay | Admitting: Family Medicine

## 2024-10-14 DIAGNOSIS — R110 Nausea: Secondary | ICD-10-CM

## 2024-10-14 MED ORDER — ONDANSETRON HCL 4 MG PO TABS: 4.0000 mg | ORAL_TABLET | Freq: Three times a day (TID) | ORAL | 0 refills | Status: AC | PRN

## 2024-10-14 MED ORDER — TIRZEPATIDE-WEIGHT MANAGEMENT 2.5 MG/0.5ML ~~LOC~~ SOLN: 2.5000 mg | SUBCUTANEOUS | 1 refills | Status: DC

## 2024-10-14 MED ORDER — GENTAMICIN SULFATE 0.1 % EX CREA: 1.0000 | TOPICAL_CREAM | Freq: Three times a day (TID) | CUTANEOUS | 3 refills | Status: AC

## 2024-10-16 ENCOUNTER — Telehealth: Payer: Self-pay | Admitting: Family Medicine

## 2024-10-16 ENCOUNTER — Ambulatory Visit (INDEPENDENT_AMBULATORY_CARE_PROVIDER_SITE_OTHER)

## 2024-10-16 DIAGNOSIS — E538 Deficiency of other specified B group vitamins: Secondary | ICD-10-CM | POA: Diagnosis not present

## 2024-10-16 MED ORDER — CYANOCOBALAMIN 1000 MCG/ML IJ SOLN
1000.0000 ug | Freq: Once | INTRAMUSCULAR | Status: AC
  Administered 2024-10-16: 1000 ug via INTRAMUSCULAR

## 2024-10-16 MED ORDER — TIRZEPATIDE-WEIGHT MANAGEMENT 2.5 MG/0.5ML ~~LOC~~ SOLN: 2.5000 mg | SUBCUTANEOUS | 1 refills | Status: DC

## 2024-10-16 NOTE — Telephone Encounter (Signed)
 Pt came in stating Erin Acosta did not get rx for 7.5 Zepbound . Please call rx in and call pt to let her know it was sent to Erin.

## 2024-10-16 NOTE — Progress Notes (Signed)
 Pt here today for monthly B12 injection per Dr. Antonio.   Cyanocobalamin  1mL injected into L deltoid IM. Pt tolerated injection well.   Next in 1 month

## 2024-10-16 NOTE — Telephone Encounter (Signed)
 Rx sent again

## 2024-10-21 ENCOUNTER — Encounter: Attending: Physical Medicine and Rehabilitation | Admitting: Registered Nurse

## 2024-10-21 VITALS — BP 139/83 | HR 69 | Ht 60.0 in | Wt 140.6 lb

## 2024-10-21 DIAGNOSIS — G894 Chronic pain syndrome: Secondary | ICD-10-CM | POA: Diagnosis present

## 2024-10-21 DIAGNOSIS — Z79891 Long term (current) use of opiate analgesic: Secondary | ICD-10-CM | POA: Insufficient documentation

## 2024-10-21 DIAGNOSIS — M5416 Radiculopathy, lumbar region: Secondary | ICD-10-CM | POA: Insufficient documentation

## 2024-10-21 DIAGNOSIS — M545 Low back pain, unspecified: Secondary | ICD-10-CM | POA: Diagnosis present

## 2024-10-21 DIAGNOSIS — Z5181 Encounter for therapeutic drug level monitoring: Secondary | ICD-10-CM | POA: Insufficient documentation

## 2024-10-21 DIAGNOSIS — G8929 Other chronic pain: Secondary | ICD-10-CM | POA: Diagnosis present

## 2024-10-21 MED ORDER — HYDROCODONE-ACETAMINOPHEN 10-325 MG PO TABS: 1.0000 | ORAL_TABLET | Freq: Four times a day (QID) | ORAL | 0 refills | Status: AC | PRN

## 2024-10-21 NOTE — Telephone Encounter (Signed)
 Copied from CRM #[id number]. Topic: Clinical - Prescription Issue >> Oct 21, 2024  1:59 PM Hadassah PARAS wrote: Reason for CRM: Pt has questions and would like an update on the 7.5 Zepbound . This medication has been requested twice. Please advise #[phone]

## 2024-10-21 NOTE — Telephone Encounter (Signed)
 Looks like we sent 2.5mg ?

## 2024-10-21 NOTE — Progress Notes (Unsigned)
 Subjective:    Patient ID: Erin Acosta, female    DOB: 02-20-1951, 73 y.o.   MRN: [id number]  HPI: Erin Acosta is a 73 y.o. female who returns for follow up appointment for chronic pain and medication refill. states *** pain is located in  ***. rates pain ***. current exercise regime is walking and performing stretching exercises.  Ms. Wachter Morphine equivalent is *** MME.   Last Oral Swab was Performed on 09/19/2024, it was consistent.     Pain Inventory Average Pain 5 Pain Right Now 3 My pain is aching  In the last 24 hours, has pain interfered with the following? General activity 2 Relation with others 1 Enjoyment of life 1 What TIME of day is your pain at its worst? evening Sleep (in general) Good  Pain is worse with: inactivity and standing Pain improves with: rest, medication Relief from Meds: 7  Family History  Problem Relation Age of Onset   Arthritis Other    Hyperlipidemia Other    Heart disease Other    Stroke Other    Hypertension Other    Social History   Socioeconomic History   Marital status: Widowed    Spouse name: Not on file   Number of children: 1   Years of education: 23   Highest education level: Not on file  Occupational History   Not on file  Tobacco Use   Smoking status: Never   Smokeless tobacco: Never  Vaping Use   Vaping status: Never Used  Substance and Sexual Activity   Alcohol use: Yes    Alcohol/week: 1.0 standard drink of alcohol    Types: 1 Standard drinks or equivalent per week    Comment: Wine in the evening   Drug use: No   Sexual activity: Not on file  Other Topics Concern   Not on file  Social History Narrative   Right handed   Drinks caffeine    Lives alone   retired   Social Drivers of Corporate Investment Banker Strain: Low Risk  (07/20/2023)   Overall Financial Resource Strain (CARDIA)    Difficulty of Paying Living Expenses: Not hard at all  Food Insecurity: No Food Insecurity (07/20/2023)   Hunger  Vital Sign    Worried About Running Out of Food in the Last Year: Never true    Ran Out of Food in the Last Year: Never true  Transportation Needs: No Transportation Needs (07/20/2023)   PRAPARE - Administrator, Civil Service (Medical): No    Lack of Transportation (Non-Medical): No  Physical Activity: Inactive (07/20/2023)   Exercise Vital Sign    Days of Exercise per Week: 0 days    Minutes of Exercise per Session: 0 min  Stress: No Stress Concern Present (07/20/2023)   [employer] of Occupational Health - Occupational Stress Questionnaire    Feeling of Stress : Only a little  Social Connections: Moderately Isolated (07/20/2023)   Social Connection and Isolation Panel    Frequency of Communication with Friends and Family: Twice a week    Frequency of Social Gatherings with Friends and Family: Twice a week    Attends Religious Services: Never    Database Administrator or Organizations: Yes    Attends Banker Meetings: Never    Marital Status: Widowed   Past Surgical History:  Procedure Laterality Date   BILATERAL CARPAL TUNNEL RELEASE     BLEPHAROPLASTY     BREAST BIOPSY  unsure which breast   CESAREAN SECTION     FOOT SURGERY Right    mini face lift     TOTAL SHOULDER ARTHROPLASTY Right    [facility]   TRIGGER FINGER RELEASE     tummy tuck     Past Surgical History:  Procedure Laterality Date   BILATERAL CARPAL TUNNEL RELEASE     BLEPHAROPLASTY     BREAST BIOPSY     unsure which breast   CESAREAN SECTION     FOOT SURGERY Right    mini face lift     TOTAL SHOULDER ARTHROPLASTY Right    [facility]   TRIGGER FINGER RELEASE     tummy tuck     Past Medical History:  Diagnosis Date   Allergy    Anxiety    Depression    Genital herpes    H/O cesarean section    Hypertension    Rotator cuff tear    Scoliosis    BP 139/83 (BP Location: Left Arm, Patient Position: Sitting, Cuff Size: Normal)   Pulse 69   Ht 5' (1.524 m)   Wt 140  lb 9.6 oz (63.8 kg)   SpO2 97%   BMI 27.46 kg/m   Opioid Risk Score:   Fall Risk Score:  `1  Depression screen [facility] 2/9     08/13/2024    6:58 PM 07/30/2024   11:20 AM 05/29/2024   11:23 AM 04/29/2024   11:38 AM 03/21/2024   11:30 AM 01/23/2024    1:05 PM 08/21/2023    1:07 PM  Depression screen PHQ 2/9  Decreased Interest 0 0 1 0 0 1 0  Down, Depressed, Hopeless 0 0 1 0 0 1 0  PHQ - 2 Score 0 0 2 0 0 2 0  Altered sleeping 1   1     Tired, decreased energy 1   1     Change in appetite 0   0     Feeling bad or failure about yourself  0   0     Trouble concentrating 0   0     Moving slowly or fidgety/restless 0   0     Suicidal thoughts 0   0     PHQ-9 Score 2    2      Difficult doing work/chores    Not difficult at all        Data saved with a previous flowsheet row definition     Review of Systems  Musculoskeletal:  Positive for arthralgias, back pain and myalgias.       Back pain, bilateral hip and leg pain  All other systems reviewed and are negative.      Objective:   Physical Exam        Assessment & Plan:  Chronic Bilateral Low Back Pain without sciatica/ Lumbar Radiculitis: Continue HEP as Tolerated. Continue to Monitor. 09/19/2024 Chronic Right Hip/ OA:No complaints today. Continue HEP as Tolerated. Continue to Monitor. 09/19/2024 Chronic Pain Syndrome: Refilled:: Hydrocodone  10/325 mg one tablet four times a day as needed for [pain # 110.  We will continue the opioid monitoring program, this consists of regular clinic visits, examinations, urine drug screen, pill counts as well as use of [place]  Controlled Substance Reporting system. A 12 month History has been reviewed on the [place]  Controlled Substance Reporting System on 09/19/2024,    F/U in 2 months

## 2024-10-22 ENCOUNTER — Encounter: Payer: Self-pay | Admitting: Registered Nurse

## 2024-10-22 MED ORDER — TIRZEPATIDE-WEIGHT MANAGEMENT 7.5 MG/0.5ML ~~LOC~~ SOLN: 7.5000 mg | SUBCUTANEOUS | 1 refills | Status: DC

## 2024-10-22 NOTE — Addendum Note (Signed)
 Addended by: Ellisyn Icenhower D on: 10/22/2024 01:29 PM   Modules accepted: Orders

## 2024-10-28 ENCOUNTER — Ambulatory Visit: Payer: Self-pay

## 2024-10-28 ENCOUNTER — Institutional Professional Consult (permissible substitution): Admitting: Psychology

## 2024-11-04 ENCOUNTER — Encounter: Admitting: Psychology

## 2024-11-06 ENCOUNTER — Other Ambulatory Visit: Payer: Self-pay | Admitting: Family Medicine

## 2024-11-06 DIAGNOSIS — F419 Anxiety disorder, unspecified: Secondary | ICD-10-CM

## 2024-11-08 ENCOUNTER — Ambulatory Visit: Admitting: Physician Assistant

## 2024-11-18 ENCOUNTER — Other Ambulatory Visit: Payer: Self-pay | Admitting: Family Medicine

## 2024-11-18 MED ORDER — TIRZEPATIDE-WEIGHT MANAGEMENT 7.5 MG/0.5ML ~~LOC~~ SOLN: 7.5000 mg | SUBCUTANEOUS | 1 refills | Status: AC

## 2024-11-18 NOTE — Telephone Encounter (Signed)
 Copied from CRM #[id number]. Topic: Clinical - Medication Refill >> Nov 18, 2024  3:30 PM Thersia C wrote: Medication: tirzepatide  7.5 MG/0.5ML injection vial - need it in 5 percent   Has the patient contacted their pharmacy? Yes (Agent: If no, request that the patient contact the pharmacy for the refill. If patient does not wish to contact the pharmacy document the reason why and proceed with request.) (Agent: If yes, when and what did the pharmacy advise?)  This is the patient's preferred pharmacy:   [facility] [place], [place] - [phone] [place] [address] Phone: [phone] Fax: [phone]  Is this the correct pharmacy for this prescription? Yes If no, delete pharmacy and type the correct one.   Has the prescription been filled recently? No  Is the patient out of the medication? Yes  Has the patient been seen for an appointment in the last year OR does the patient have an upcoming appointment? Yes  Can we respond through MyChart? Yes  Agent: Please be advised that Rx refills may take up to 3 business days. We ask that you follow-up with your pharmacy.

## 2024-11-19 ENCOUNTER — Ambulatory Visit (INDEPENDENT_AMBULATORY_CARE_PROVIDER_SITE_OTHER)

## 2024-11-19 DIAGNOSIS — E538 Deficiency of other specified B group vitamins: Secondary | ICD-10-CM

## 2024-11-19 MED ORDER — CYANOCOBALAMIN 1000 MCG/ML IJ SOLN
1000.0000 ug | Freq: Once | INTRAMUSCULAR | Status: AC
  Administered 2024-11-19: 1000 ug via INTRAMUSCULAR

## 2024-11-19 NOTE — Progress Notes (Signed)
 Pt here for monthly B12 injection per PCP Last B12 injection: 10/16/2024  Last B12 level: 04/29/2024  B12 1000mcg given IM L deltoid, and pt tolerated injection well.  Next B12 injection scheduled for: 01/08/2024 as [place].

## 2024-11-27 ENCOUNTER — Other Ambulatory Visit: Payer: Self-pay | Admitting: Family Medicine

## 2024-11-27 NOTE — Telephone Encounter (Signed)
 Copied from CRM [id number]. Topic: Clinical - Medication Refill >> Nov 27, 2024  3:07 PM Roselie C wrote: Medication:  tirzepatide  7.5 MG/0.5ML injection vial - need it in 5 percent   Has the patient contacted their pharmacy? Yes (Agent: If no, request that the patient contact the pharmacy for the refill. If patient does not wish to contact the pharmacy document the reason why and proceed with request.) (Agent: If yes, when and what did the pharmacy advise?)  This is the patient's preferred pharmacy:  [facility] - [place], [place] - [address] [place] [address] Phone: [phone] Fax: [phone]  [facility] [place], [place] - [phone] [place] [id number] Phone: [phone] Fax: [phone]  Is this the correct pharmacy for this prescription? Yes If no, delete pharmacy and type the correct one.   Has the prescription been filled recently? Yes  Is the patient out of the medication? Yes  Has the patient been seen for an appointment in the last year OR does the patient have an upcoming appointment? Yes  Can we respond through MyChart? Yes  Agent: Please be advised that Rx refills may take up to 3 business days. We ask that you follow-up with your pharmacy.

## 2024-11-28 NOTE — Telephone Encounter (Signed)
 tirzepatide  7.5 MG/0.5ML injection vial - need it in 5 percent

## 2024-11-28 NOTE — Telephone Encounter (Signed)
 Rx sent on 11/18/24 to [facility].

## 2024-12-02 ENCOUNTER — Ambulatory Visit: Admitting: *Deleted

## 2024-12-02 VITALS — Ht 60.0 in | Wt 135.0 lb

## 2024-12-02 DIAGNOSIS — Z0000 Encounter for general adult medical examination without abnormal findings: Secondary | ICD-10-CM

## 2024-12-02 DIAGNOSIS — M8580 Other specified disorders of bone density and structure, unspecified site: Secondary | ICD-10-CM

## 2024-12-02 DIAGNOSIS — Z780 Asymptomatic menopausal state: Secondary | ICD-10-CM | POA: Diagnosis not present

## 2024-12-02 NOTE — Patient Instructions (Addendum)
 Erin Acosta,  Thank you for taking the time for your Medicare Wellness Visit. I appreciate your continued commitment to your health goals. Please review the care plan we discussed, and feel free to reach out if I can assist you further.  Please note that Annual Wellness Visits do not include a physical exam. Some assessments may be limited, especially if the visit was conducted virtually. If needed, we may recommend an in-person follow-up with your provider.  Goal:  To increase exercise to 5 days a week and walk more  Ongoing Care Seeing your primary care provider every 3 to 6 months helps us  monitor your health and provide consistent, personalized care.   Dr Antonio Meth: 02/10/25 1:20pm  Medicare AWV: 12/03/25 9am, mychart video  Referrals If a referral was made during today's visit and you haven't received any updates within two weeks, please contact the referred provider directly to check on the status.  Bone Density ([facility]) due now:  [phone]  Recommended Screenings:  Health Maintenance  Topic Date Due   Medicare Annual Wellness Visit  07/19/2024   COVID-19 Vaccine (13 - Pfizer risk 2025-26 season) 03/07/2025   Colon Cancer Screening  06/27/2026   Breast Cancer Screening  08/26/2026   DTaP/Tdap/Td vaccine (2 - Td or Tdap) 08/01/2028   Pneumococcal Vaccine for age over 34  Completed   Flu Shot  Completed   Osteoporosis screening with Bone Density Scan  Completed   Hepatitis C Screening  Completed   Zoster (Shingles) Vaccine  Completed   Meningitis B Vaccine  Aged Out       12/02/2024    9:18 AM  Advanced Directives  Does Patient Have a Medical Advance Directive? Yes  Type of Estate Agent of [place];Living will  Does patient want to make changes to medical advance directive? No - Patient declined  Copy of Healthcare Power of Attorney in Chart? No - copy requested   Please bring a copy of your health care power of attorney and living  will to the office to be added to your chart at your convenience. You can mail a copy to [facility] [address] [place], [place] [address] or email to [email or web address]   Vision: Annual vision screenings are recommended for early detection of glaucoma, cataracts, and diabetic retinopathy. These exams can also reveal signs of chronic conditions such as diabetes and high blood pressure.  Dental: Annual dental screenings help detect early signs of oral cancer, gum disease, and other conditions linked to overall health, including heart disease and diabetes.  Please see the attached documents for additional preventive care recommendations.

## 2024-12-02 NOTE — Progress Notes (Signed)
 "  Chief Complaint  Patient presents with   Medicare Wellness     Subjective:   Erin Acosta is a 74 y.o. female who presents for a Medicare Annual Wellness Visit.  Visit info / Clinical Intake: Medicare Wellness Visit Type:: Subsequent Annual Wellness Visit Persons participating in visit and providing information:: patient Medicare Wellness Visit Mode:: Video Since this visit was completed virtually, some vitals may be partially provided or unavailable. Missing vitals are due to the limitations of the virtual format.: Unable to obtain vitals - no equipment If Telephone or Video please confirm:: I connected with patient using audio/video enable telemedicine. I verified patient identity with two identifiers, discussed telehealth limitations, and patient agreed to proceed. Patient Location:: home Provider Location:: office Interpreter Needed?: No Pre-visit prep was completed: yes AWV questionnaire completed by patient prior to visit?: no Living arrangements:: (!) lives alone Patient's Overall Health Status Rating: good Typical amount of pain: some Does pain affect daily life?: (!) yes (can function and do activities as long as she takes pain medication. May have to sit every once in a while) Are you currently prescribed opioids?: (!) yes  Dietary Habits and Nutritional Risks How many meals a day?: 2 Eats fruit and vegetables daily?: yes Most meals are obtained by: preparing own meals In the last 2 weeks, have you had any of the following?: (!) nausea, vomiting, diarrhea (due to zepbound  and takes ondansetron ) Diabetic:: no  Functional Status Activities of Daily Living (to include ambulation/medication): Independent Ambulation: Independent Medication Administration: Independent Home Management (perform basic housework or laundry): Independent Manage your own finances?: yes Primary transportation is: driving Concerns about vision?: no *vision screening is required for WTM* (Up  to date with Elspeth Aran) Concerns about hearing?: no (hears out of one ear better but feels like she can hear well at present.)  Fall Screening Falls in the past year?: 0 Number of falls in past year: 0 Was there an injury with Fall?: 0 Fall Risk Category Calculator: 0 Patient Fall Risk Level: Low Fall Risk  Fall Risk Patient at Risk for Falls Due to: Orthopedic patient Fall risk Follow up: Falls evaluation completed  Home and Transportation Safety: All rugs have non-skid backing?: yes (3 total) All stairs or steps have railings?: N/A, no stairs (only up in to attic and calls a neighbor to assist) Grab bars in the bathtub or shower?: yes (has shower seat) Have non-skid surface in bathtub or shower?: yes Good home lighting?: yes Regular seat belt use?: yes Hospital stays in the last year:: no  Cognitive Assessment Difficulty concentrating, remembering, or making decisions? : yes (has history of memory concerns) Will 6CIT or Mini Cog be Completed: no 6CIT or Mini Cog Declined: patient has a diagnosis of dementia or cognitive impairment  Advance Directives (For Healthcare) Does Patient Have a Medical Advance Directive?: Yes Does patient want to make changes to medical advance directive?: No - Patient declined Type of Advance Directive: Healthcare Power of [place]; Living will Copy of Healthcare Power of Attorney in Chart?: No - copy requested Copy of Living Will in Chart?: No - copy requested  Reviewed/Updated  Reviewed/Updated: Reviewed All (Medical, Surgical, Family, Medications, Allergies, Care Teams, Patient Goals)    Allergies (verified) Oxycodone -acetaminophen , Dust mite extract, Nickel, and Oxycodone    Current Medications (verified) Outpatient Encounter Medications as of 12/02/2024  Medication Sig   aspirin EC 81 MG tablet Take 81 mg by mouth daily.   atorvastatin  (LIPITOR) 40 MG tablet TAKE 1 TABLET DAILY  cetirizine  (ZYRTEC ) 10 MG tablet TAKE 1 TABLET DAILY    clonazePAM  (KLONOPIN ) 1 MG tablet Take 1 tablet (1 mg total) by mouth 2 (two) times daily as needed.   escitalopram  (LEXAPRO ) 20 MG tablet Take 1 tablet (20 mg total) by mouth daily.   gabapentin  (NEURONTIN ) 600 MG tablet Take 1 tablet (600 mg total) by mouth 2 (two) times daily. (Patient taking differently: Take 600 mg by mouth 2 (two) times daily. Takes it once a day due to memory concerns)   gentamicin  cream (GARAMYCIN ) 0.1 % Apply 1 Application topically 3 (three) times daily.   HYDROcodone -acetaminophen  (NORCO) 10-325 MG tablet Take 1 tablet by mouth 4 (four) times daily as needed for moderate pain (pain score 4-6). Do Not Fill Before 11/08/2024   MYRBETRIQ  50 MG TB24 tablet TAKE 1 TABLET DAILY   nitroGLYCERIN  (NITROSTAT ) 0.4 MG SL tablet Place 1 tablet (0.4 mg total) under the tongue every 5 (five) minutes as needed for chest pain.   ondansetron  (ZOFRAN ) 4 MG tablet Take 1 tablet (4 mg total) by mouth every 8 (eight) hours as needed for nausea or vomiting.   tirzepatide  7.5 MG/0.5ML injection vial Inject 7.5 mg into the skin once a week. (Patient taking differently: Inject 7.5 mg into the skin once a week. Felt 7.5mg  was too strong so she reduced to 5mg .)   tretinoin  (RETIN-A ) 0.025 % cream Apply topically at bedtime.   valACYclovir  (VALTREX ) 500 MG tablet Take 1 tablet (500 mg total) by mouth daily.   No facility-administered encounter medications on file as of 12/02/2024.    History: Past Medical History:  Diagnosis Date   Allergy    Anxiety    Depression    Genital herpes    H/O cesarean section    Hypertension    Rotator cuff tear    Scoliosis    Past Surgical History:  Procedure Laterality Date   BILATERAL CARPAL TUNNEL RELEASE     BLEPHAROPLASTY     BREAST BIOPSY     unsure which breast   CESAREAN SECTION     FOOT SURGERY Right    mini face lift     TOTAL SHOULDER ARTHROPLASTY Right    [facility]   TRIGGER FINGER RELEASE     tummy tuck     Family History   Problem Relation Age of Onset   Arthritis Other    Hyperlipidemia Other    Heart disease Other    Stroke Other    Hypertension Other    Social History   Occupational History   Not on file  Tobacco Use   Smoking status: Never   Smokeless tobacco: Never  Vaping Use   Vaping status: Never Used  Substance and Sexual Activity   Alcohol use: Not Currently    Comment: 1-2 glass of wine every 3 months   Drug use: No   Sexual activity: Not on file   Tobacco Counseling Counseling given: Not Answered  SDOH Screenings   Food Insecurity: No Food Insecurity (12/02/2024)  Housing: Low Risk (12/02/2024)  Transportation Needs: No Transportation Needs (12/02/2024)  Utilities: Not At Risk (12/02/2024)  Alcohol Screen: Low Risk (07/20/2023)  Depression (PHQ2-9): Low Risk (12/02/2024)  Financial Resource Strain: Low Risk (07/20/2023)  Physical Activity: Insufficiently Active (12/02/2024)  Social Connections: Moderately Isolated (12/02/2024)  Stress: No Stress Concern Present (12/02/2024)  Tobacco Use: Low Risk (12/02/2024)  Health Literacy: Adequate Health Literacy (07/20/2023)   See flowsheets for full screening details  Depression Screen PHQ 2 &  9 Depression Scale- Over the past 2 weeks, how often have you been bothered by any of the following problems? Little interest or pleasure in doing things: 0 Feeling down, depressed, or hopeless (PHQ Adolescent also includes...irritable): 0 PHQ-2 Total Score: 0 Trouble falling or staying asleep, or sleeping too much: 1 Feeling tired or having little energy: 1 Poor appetite or overeating (PHQ Adolescent also includes...weight loss): 0 Feeling bad about yourself - or that you are a failure or have let yourself or your family down: 0 Trouble concentrating on things, such as reading the newspaper or watching television (PHQ Adolescent also includes...like school work): 0 Moving or speaking so slowly that other people could have noticed. Or the opposite -  being so fidgety or restless that you have been moving around a lot more than usual: 0 Thoughts that you would be better off dead, or of hurting yourself in some way: 0 PHQ-9 Total Score: 2 If you checked off any problems, how difficult have these problems made it for you to do your work, take care of things at home, or get along with other people?: Not difficult at all     Goals Addressed             This Visit's Progress    to exercise 5 days a week and start walking more               Objective:    Today's Vitals   12/02/24 0906  Weight: 135 lb (61.2 kg)  Height: 5' (1.524 m)   Body mass index is 26.37 kg/m.  Hearing/Vision screen No results found. Immunizations and Health Maintenance Health Maintenance  Topic Date Due   COVID-19 Vaccine (13 - Pfizer risk 2025-26 season) 03/07/2025   Medicare Annual Wellness (AWV)  12/02/2025   Colonoscopy  06/27/2026   Mammogram  08/26/2026   DTaP/Tdap/Td (2 - Td or Tdap) 08/01/2028   Pneumococcal Vaccine: 50+ Years  Completed   Influenza Vaccine  Completed   Bone Density Scan  Completed   Hepatitis C Screening  Completed   Zoster Vaccines- Shingrix  Completed   Meningococcal B Vaccine  Aged Out        Assessment/Plan:  This is a routine wellness examination for Kazzandra.  Patient Care Team: Antonio Meth, Jamee SAUNDERS, DO as PCP - General (Family Medicine) Towana Fonda RAMAN, MD as Referring Physician (Dermatology) Genette Charlie GAILS, MD (Obstetrics and Gynecology) Launie, Alexa Shad, MD as Referring Physician (Cardiology) Ferdie Nada GAILS, MD as Consulting Physician (Plastic Surgery) Kathy Vicenta POUR., MD (Obstetrics and Gynecology) Debby Fidela CROME, NP as Nurse Practitioner (Physical Medicine and Rehabilitation) Marcey Elspeth PARAS, MD as Consulting Physician (Ophthalmology) Wertman, Sara E, PA-C (Neurology)  I have personally reviewed and noted the following in the patients chart:   Medical and social history Use of  alcohol, tobacco or illicit drugs  Current medications and supplements including opioid prescriptions. Functional ability and status Nutritional status Physical activity Advanced directives List of other physicians Hospitalizations, surgeries, and ER visits in previous 12 months Vitals Screenings to include cognitive, depression, and falls Referrals and appointments  Orders Placed This Encounter  Procedures   DG Bone Density    Standing Status:   Future    Expected Date:   12/02/2024    Expiration Date:   12/02/2025    Reason for Exam (SYMPTOM  OR DIAGNOSIS REQUIRED):   postmenopausal estrogen deficiency, osteopenia    Preferred imaging location?:   [facility]  In addition, I have reviewed and discussed with patient certain preventive protocols, quality metrics, and best practice recommendations. A written personalized care plan for preventive services as well as general preventive health recommendations were provided to patient.   Lolita Libra, CMA   12/02/2024   Return in 1 year (on 12/02/2025).  After Visit Summary: (MyChart) Due to this being a telephonic visit, the after visit summary with patients personalized plan was offered to patient via MyChart   Nurse Notes: HM Addressed: DEXA ordered  "

## 2025-01-06 ENCOUNTER — Encounter: Admitting: Registered Nurse

## 2025-01-07 ENCOUNTER — Other Ambulatory Visit (HOSPITAL_BASED_OUTPATIENT_CLINIC_OR_DEPARTMENT_OTHER)

## 2025-01-07 ENCOUNTER — Ambulatory Visit

## 2025-02-10 ENCOUNTER — Ambulatory Visit: Admitting: Family Medicine

## 2025-12-03 ENCOUNTER — Ambulatory Visit
